# Patient Record
Sex: Female | Born: 1935 | Race: White | Hispanic: No | Smoking: Never smoker
Health system: Southern US, Community
[De-identification: ages and names within clinical notes are randomized; demographics above are authoritative.]

## PROBLEM LIST (undated history)

## (undated) DIAGNOSIS — E119 Type 2 diabetes mellitus without complications: Secondary | ICD-10-CM

## (undated) DIAGNOSIS — J189 Pneumonia, unspecified organism: Secondary | ICD-10-CM

## (undated) DIAGNOSIS — I2510 Atherosclerotic heart disease of native coronary artery without angina pectoris: Secondary | ICD-10-CM

## (undated) DIAGNOSIS — F419 Anxiety disorder, unspecified: Secondary | ICD-10-CM

## (undated) DIAGNOSIS — I10 Essential (primary) hypertension: Secondary | ICD-10-CM

## (undated) HISTORY — PX: CATARACT EXTRACTION: SUR2

---

## 1999-07-27 ENCOUNTER — Encounter: Payer: Self-pay | Admitting: Specialist

## 1999-07-27 ENCOUNTER — Ambulatory Visit (HOSPITAL_COMMUNITY): Admission: RE | Admit: 1999-07-27 | Discharge: 1999-07-28 | Payer: Self-pay | Admitting: Specialist

## 1999-10-13 ENCOUNTER — Encounter: Admission: RE | Admit: 1999-10-13 | Discharge: 1999-10-29 | Payer: Self-pay | Admitting: Specialist

## 2000-09-14 ENCOUNTER — Encounter: Payer: Self-pay | Admitting: Internal Medicine

## 2000-09-14 ENCOUNTER — Encounter: Admission: RE | Admit: 2000-09-14 | Discharge: 2000-09-14 | Payer: Self-pay | Admitting: Internal Medicine

## 2002-08-20 ENCOUNTER — Encounter: Payer: Self-pay | Admitting: Emergency Medicine

## 2002-08-20 ENCOUNTER — Inpatient Hospital Stay (HOSPITAL_COMMUNITY): Admission: AC | Admit: 2002-08-20 | Discharge: 2002-08-25 | Payer: Self-pay

## 2002-09-24 ENCOUNTER — Encounter (HOSPITAL_COMMUNITY): Admission: RE | Admit: 2002-09-24 | Discharge: 2002-12-23 | Payer: Self-pay | Admitting: Cardiology

## 2002-11-07 ENCOUNTER — Encounter: Payer: Self-pay | Admitting: Cardiology

## 2002-11-07 ENCOUNTER — Ambulatory Visit (HOSPITAL_COMMUNITY): Admission: RE | Admit: 2002-11-07 | Discharge: 2002-11-07 | Payer: Self-pay | Admitting: Cardiology

## 2002-11-15 ENCOUNTER — Ambulatory Visit (HOSPITAL_COMMUNITY): Admission: RE | Admit: 2002-11-15 | Discharge: 2002-11-16 | Payer: Self-pay | Admitting: Cardiology

## 2004-05-01 ENCOUNTER — Ambulatory Visit: Payer: Self-pay | Admitting: Hematology & Oncology

## 2004-07-10 ENCOUNTER — Ambulatory Visit: Payer: Self-pay | Admitting: Hematology & Oncology

## 2004-09-17 ENCOUNTER — Ambulatory Visit: Payer: Self-pay | Admitting: Hematology & Oncology

## 2004-12-17 ENCOUNTER — Ambulatory Visit: Payer: Self-pay | Admitting: Hematology & Oncology

## 2005-03-18 ENCOUNTER — Ambulatory Visit: Payer: Self-pay | Admitting: Hematology & Oncology

## 2005-06-17 ENCOUNTER — Ambulatory Visit: Payer: Self-pay | Admitting: Hematology & Oncology

## 2005-07-23 ENCOUNTER — Encounter: Admission: RE | Admit: 2005-07-23 | Discharge: 2005-07-23 | Payer: Self-pay | Admitting: Internal Medicine

## 2005-09-17 ENCOUNTER — Ambulatory Visit: Payer: Self-pay | Admitting: Hematology & Oncology

## 2005-10-15 LAB — CBC WITH DIFFERENTIAL/PLATELET
Basophils Absolute: 0 10*3/uL (ref 0.0–0.1)
EOS%: 3.8 % (ref 0.0–7.0)
HGB: 14.5 g/dL (ref 11.6–15.9)
LYMPH%: 37.6 % (ref 14.0–48.0)
MCH: 29.2 pg (ref 26.0–34.0)
MCV: 88.3 fL (ref 81.0–101.0)
MONO%: 6.2 % (ref 0.0–13.0)
Platelets: 198 10*3/uL (ref 145–400)
RBC: 4.97 10*6/uL (ref 3.70–5.32)
RDW: 15.7 % — ABNORMAL HIGH (ref 11.3–14.5)

## 2005-11-12 ENCOUNTER — Ambulatory Visit: Payer: Self-pay | Admitting: Hematology & Oncology

## 2005-11-12 LAB — CBC WITH DIFFERENTIAL/PLATELET
Basophils Absolute: 0 10*3/uL (ref 0.0–0.1)
EOS%: 4.6 % (ref 0.0–7.0)
Eosinophils Absolute: 0.3 10*3/uL (ref 0.0–0.5)
HCT: 43.4 % (ref 34.8–46.6)
HGB: 14.4 g/dL (ref 11.6–15.9)
MCH: 29.4 pg (ref 26.0–34.0)
MCV: 88.4 fL (ref 81.0–101.0)
MONO%: 6.5 % (ref 0.0–13.0)
NEUT%: 51.4 % (ref 39.6–76.8)

## 2005-12-10 LAB — CBC WITH DIFFERENTIAL/PLATELET
Basophils Absolute: 0 10*3/uL (ref 0.0–0.1)
Eosinophils Absolute: 0.3 10*3/uL (ref 0.0–0.5)
HCT: 45.3 % (ref 34.8–46.6)
HGB: 15.3 g/dL (ref 11.6–15.9)
LYMPH%: 23.5 % (ref 14.0–48.0)
MONO#: 0.5 10*3/uL (ref 0.1–0.9)
NEUT#: 5.8 10*3/uL (ref 1.5–6.5)
NEUT%: 67.3 % (ref 39.6–76.8)
Platelets: 192 10*3/uL (ref 145–400)
WBC: 8.7 10*3/uL (ref 3.9–10.0)

## 2006-01-04 ENCOUNTER — Ambulatory Visit: Payer: Self-pay | Admitting: Hematology & Oncology

## 2006-01-07 LAB — CBC WITH DIFFERENTIAL/PLATELET
Basophils Absolute: 0 10*3/uL (ref 0.0–0.1)
Eosinophils Absolute: 0.2 10*3/uL (ref 0.0–0.5)
HGB: 14.7 g/dL (ref 11.6–15.9)
MCV: 88.7 fL (ref 81.0–101.0)
MONO%: 5.8 % (ref 0.0–13.0)
NEUT#: 2.9 10*3/uL (ref 1.5–6.5)
RDW: 15.9 % — ABNORMAL HIGH (ref 11.3–14.5)

## 2006-02-04 LAB — CBC WITH DIFFERENTIAL/PLATELET
BASO%: 0.3 % (ref 0.0–2.0)
MCHC: 33.4 g/dL (ref 32.0–36.0)
MONO#: 0.4 10*3/uL (ref 0.1–0.9)
RBC: 5.07 10*6/uL (ref 3.70–5.32)
RDW: 15.4 % — ABNORMAL HIGH (ref 11.3–14.5)
WBC: 8 10*3/uL (ref 3.9–10.0)
lymph#: 2.3 10*3/uL (ref 0.9–3.3)

## 2006-03-09 ENCOUNTER — Ambulatory Visit: Payer: Self-pay | Admitting: Hematology & Oncology

## 2006-03-11 LAB — CBC WITH DIFFERENTIAL/PLATELET
BASO%: 0.3 % (ref 0.0–2.0)
Basophils Absolute: 0 10*3/uL (ref 0.0–0.1)
HCT: 43.5 % (ref 34.8–46.6)
HGB: 14.8 g/dL (ref 11.6–15.9)
MCHC: 34.1 g/dL (ref 32.0–36.0)
MONO#: 0.6 10*3/uL (ref 0.1–0.9)
NEUT#: 2.8 10*3/uL (ref 1.5–6.5)
NEUT%: 47.4 % (ref 39.6–76.8)
WBC: 6 10*3/uL (ref 3.9–10.0)
lymph#: 2.4 10*3/uL (ref 0.9–3.3)

## 2006-05-04 ENCOUNTER — Ambulatory Visit: Payer: Self-pay | Admitting: Hematology & Oncology

## 2006-05-06 LAB — CBC WITH DIFFERENTIAL/PLATELET
BASO%: 0.3 % (ref 0.0–2.0)
LYMPH%: 32.3 % (ref 14.0–48.0)
MCHC: 33.5 g/dL (ref 32.0–36.0)
MCV: 90.8 fL (ref 81.0–101.0)
MONO%: 9.2 % (ref 0.0–13.0)
Platelets: 193 10*3/uL (ref 145–400)
RBC: 4.97 10*6/uL (ref 3.70–5.32)
RDW: 14.9 % — ABNORMAL HIGH (ref 11.3–14.5)
WBC: 5.6 10*3/uL (ref 3.9–10.0)

## 2006-06-03 LAB — CBC WITH DIFFERENTIAL/PLATELET
BASO%: 0.3 % (ref 0.0–2.0)
Basophils Absolute: 0 10*3/uL (ref 0.0–0.1)
EOS%: 3.1 % (ref 0.0–7.0)
HGB: 14.9 g/dL (ref 11.6–15.9)
MCH: 30.9 pg (ref 26.0–34.0)
MCHC: 34.1 g/dL (ref 32.0–36.0)
MCV: 90.5 fL (ref 81.0–101.0)
MONO%: 9.9 % (ref 0.0–13.0)
NEUT%: 55.7 % (ref 39.6–76.8)
RDW: 14.6 % — ABNORMAL HIGH (ref 11.3–14.5)

## 2006-06-28 ENCOUNTER — Ambulatory Visit: Payer: Self-pay | Admitting: Hematology & Oncology

## 2006-07-01 LAB — CBC WITH DIFFERENTIAL/PLATELET
BASO%: 0.6 % (ref 0.0–2.0)
EOS%: 4.1 % (ref 0.0–7.0)
Eosinophils Absolute: 0.2 10*3/uL (ref 0.0–0.5)
LYMPH%: 32.3 % (ref 14.0–48.0)
MCHC: 33.2 g/dL (ref 32.0–36.0)
MCV: 91.2 fL (ref 81.0–101.0)
MONO%: 9.9 % (ref 0.0–13.0)
NEUT#: 3 10*3/uL (ref 1.5–6.5)
RBC: 5.04 10*6/uL (ref 3.70–5.32)
RDW: 14.5 % (ref 11.3–14.5)

## 2006-07-29 LAB — CBC WITH DIFFERENTIAL/PLATELET
BASO%: 0.5 % (ref 0.0–2.0)
Eosinophils Absolute: 0.2 10*3/uL (ref 0.0–0.5)
MCHC: 34.6 g/dL (ref 32.0–36.0)
MONO#: 0.5 10*3/uL (ref 0.1–0.9)
NEUT#: 2.8 10*3/uL (ref 1.5–6.5)
Platelets: 187 10*3/uL (ref 145–400)
RBC: 5.06 10*6/uL (ref 3.70–5.32)
RDW: 14.6 % — ABNORMAL HIGH (ref 11.3–14.5)
WBC: 5.7 10*3/uL (ref 3.9–10.0)
lymph#: 2.1 10*3/uL (ref 0.9–3.3)

## 2006-08-05 ENCOUNTER — Encounter: Admission: RE | Admit: 2006-08-05 | Discharge: 2006-08-05 | Payer: Self-pay | Admitting: Internal Medicine

## 2006-08-23 ENCOUNTER — Ambulatory Visit: Payer: Self-pay | Admitting: Hematology & Oncology

## 2006-08-26 LAB — CBC WITH DIFFERENTIAL/PLATELET
BASO%: 0.2 % (ref 0.0–2.0)
Eosinophils Absolute: 0.2 10*3/uL (ref 0.0–0.5)
HCT: 44.9 % (ref 34.8–46.6)
LYMPH%: 34.8 % (ref 14.0–48.0)
MCHC: 34.2 g/dL (ref 32.0–36.0)
MONO#: 0.5 10*3/uL (ref 0.1–0.9)
NEUT#: 3.2 10*3/uL (ref 1.5–6.5)
NEUT%: 53.7 % (ref 39.6–76.8)
Platelets: 172 10*3/uL (ref 145–400)
WBC: 6 10*3/uL (ref 3.9–10.0)
lymph#: 2.1 10*3/uL (ref 0.9–3.3)

## 2006-08-26 LAB — FERRITIN: Ferritin: 10 ng/mL (ref 10–291)

## 2006-09-23 LAB — CBC WITH DIFFERENTIAL/PLATELET
Basophils Absolute: 0 10*3/uL (ref 0.0–0.1)
Eosinophils Absolute: 0.2 10*3/uL (ref 0.0–0.5)
HCT: 44.7 % (ref 34.8–46.6)
HGB: 15.4 g/dL (ref 11.6–15.9)
MCH: 30.8 pg (ref 26.0–34.0)
MCV: 89.7 fL (ref 81.0–101.0)
NEUT#: 2.9 10*3/uL (ref 1.5–6.5)
NEUT%: 53.5 % (ref 39.6–76.8)
RDW: 12.7 % (ref 11.3–14.5)
lymph#: 1.9 10*3/uL (ref 0.9–3.3)

## 2006-10-18 ENCOUNTER — Ambulatory Visit: Payer: Self-pay | Admitting: Hematology & Oncology

## 2006-10-19 ENCOUNTER — Emergency Department (HOSPITAL_COMMUNITY): Admission: EM | Admit: 2006-10-19 | Discharge: 2006-10-19 | Payer: Self-pay | Admitting: Emergency Medicine

## 2006-11-18 LAB — CBC WITH DIFFERENTIAL/PLATELET
Basophils Absolute: 0 10*3/uL (ref 0.0–0.1)
EOS%: 3.8 % (ref 0.0–7.0)
Eosinophils Absolute: 0.3 10*3/uL (ref 0.0–0.5)
HGB: 16.2 g/dL — ABNORMAL HIGH (ref 11.6–15.9)
LYMPH%: 31.8 % (ref 14.0–48.0)
MCH: 32.4 pg (ref 26.0–34.0)
MCV: 92.2 fL (ref 81.0–101.0)
MONO%: 7.1 % (ref 0.0–13.0)
NEUT#: 4 10*3/uL (ref 1.5–6.5)
NEUT%: 57 % (ref 39.6–76.8)
Platelets: 176 10*3/uL (ref 145–400)

## 2006-12-06 ENCOUNTER — Ambulatory Visit: Payer: Self-pay | Admitting: Hematology & Oncology

## 2006-12-09 LAB — CBC WITH DIFFERENTIAL/PLATELET
EOS%: 3.2 % (ref 0.0–7.0)
Eosinophils Absolute: 0.2 10*3/uL (ref 0.0–0.5)
LYMPH%: 29 % (ref 14.0–48.0)
MCH: 32.9 pg (ref 26.0–34.0)
MCHC: 35.1 g/dL (ref 32.0–36.0)
MCV: 93.7 fL (ref 81.0–101.0)
MONO%: 7.8 % (ref 0.0–13.0)
Platelets: 179 10*3/uL (ref 145–400)
RBC: 4.88 10*6/uL (ref 3.70–5.32)

## 2007-03-08 ENCOUNTER — Ambulatory Visit: Payer: Self-pay | Admitting: Hematology & Oncology

## 2007-03-10 LAB — CBC WITH DIFFERENTIAL/PLATELET
BASO%: 0.2 % (ref 0.0–2.0)
LYMPH%: 33.3 % (ref 14.0–48.0)
MCHC: 35.2 g/dL (ref 32.0–36.0)
MONO#: 0.5 10*3/uL (ref 0.1–0.9)
MONO%: 8.5 % (ref 0.0–13.0)
Platelets: 167 10*3/uL (ref 145–400)
RBC: 4.88 10*6/uL (ref 3.70–5.32)
RDW: 13.3 % (ref 11.3–14.5)
WBC: 6.2 10*3/uL (ref 3.9–10.0)

## 2007-09-05 ENCOUNTER — Ambulatory Visit: Payer: Self-pay | Admitting: Hematology & Oncology

## 2007-09-07 LAB — CBC & DIFF AND RETIC
BASO%: 0.3 % (ref 0.0–2.0)
EOS%: 2.4 % (ref 0.0–7.0)
IRF: 0.35 — ABNORMAL HIGH (ref 0.130–0.330)
MCH: 33.3 pg (ref 26.0–34.0)
MCHC: 34.9 g/dL (ref 32.0–36.0)
MONO#: 0.4 10*3/uL (ref 0.1–0.9)
NEUT%: 56.9 % (ref 39.6–76.8)
RBC: 4.73 10*6/uL (ref 3.70–5.32)
RDW: 13 % (ref 11.3–14.5)
RETIC #: 99.8 10*3/uL (ref 19.7–115.1)
Retic %: 2.1 % (ref 0.4–2.3)
WBC: 6.1 10*3/uL (ref 3.9–10.0)
lymph#: 2.1 10*3/uL (ref 0.9–3.3)

## 2007-09-07 LAB — FERRITIN: Ferritin: 37 ng/mL (ref 10–291)
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
## 2007-10-20 ENCOUNTER — Encounter: Admission: RE | Admit: 2007-10-20 | Discharge: 2007-10-20 | Payer: Self-pay | Admitting: Internal Medicine

## 2008-11-29 ENCOUNTER — Encounter: Admission: RE | Admit: 2008-11-29 | Discharge: 2008-11-29 | Payer: Self-pay | Admitting: Internal Medicine

## 2010-03-25 ENCOUNTER — Encounter: Admission: RE | Admit: 2010-03-25 | Discharge: 2010-03-25 | Payer: Self-pay | Admitting: Internal Medicine

## 2010-07-12 ENCOUNTER — Encounter: Payer: Self-pay | Admitting: Internal Medicine

## 2010-11-06 NOTE — Cardiovascular Report (Signed)
NAME:  Diane Fry, Diane Fry                           ACCOUNT NO.:  [id number]   MEDICAL RECORD NO.:  [id number]                   PATIENT TYPE:  OIB   LOCATION:  6532                                 FACILITY:  [facility]   PHYSICIAN:  Mohan N. Sharyn Lull, M.D.              DATE OF BIRTH:  [birth year]   DATE OF PROCEDURE:  11/15/2002  DATE OF DISCHARGE:  11/16/2002                              CARDIAC CATHETERIZATION   PROCEDURE:  1. Left cardiac catheterization with selective left and right coronary     angiogram and left ventriculography via the right groin using Judkins     technique.  2. Successful percutaneous transluminal coronary angioplasty to the proximal     left anterior descending using 3.25 mm x 15 mm long Quantum Maverick     balloon.   INDICATIONS:  Diane Fry is a 77 year old white female with past medical  history significant for coronary artery disease, status post non Q wave MI  recently, status post PTCA and stenting to the LAD, hypertension,  hypercholesterolemia and depression. She complains of retrosternal chest  tightness radiating to the neck associated with diaphoresis lasting three  minutes when under stress.  The patient denies any nausea or vomiting or  shortness of breath.  Denies palpation, lightheadedness or syncope.  The  patient underwent Persantine Cardiolite on Nov 07, 2002 which showed  partially reversible anterolateral wall ischemia with EF of 85%.  Due to  typical angina, chest pain and partial Persantine and Cardiolite, the  patient is advised for left cath, possible PTCA stenting.   DESCRIPTION OF PROCEDURE:  After obtaining the informed consent, the patient  was brought to the cath lab and was placed on the fluoroscopy table.  Right  groin was prepped and draped in the usual fashion.  Two percent Xylocaine  was used for local anesthesia in the right groin.  With the help of thin-  wall needle, a 6-French arterial sheath was placed.  The sheath was  aspirated and flushed.  Next, a 6-French left Judkins catheter was advanced  over the wire under fluoroscopic guidance up to the ascending aorta. Wire  was pulled out and the catheter was aspirated and connected to the manifold.  Catheter was further advanced and engaged into the left coronary ostium.  Multiple views of the left system were taken.  Next, the catheter was  disengaged and was pulled out over the wire and we placed a 6-French right  Judkins catheter which was advanced over the wire under fluoroscopic  guidance up to the ascending aorta.  Wire was pulled out, catheter was  aspirated and connected to the manifold.  Catheter was further advanced and  engaged into the right coronary ostium.  Multiple views of the right system  were taken.  Next, the catheter was disengaged and was pulled out towards  the wire and we placed a 6-French pigtail catheter which was advanced  over  the wire under fluoroscopic guidance up to the ascending aorta.  Wire was  pulled out, the catheter was aspirated and connected to the manifold.  The  catheter was further advanced across the aortic valve into the left  ventricle.  The left ventricular pressures were recorded.  Next, left  ventriculography was done in 30-degree RAO position.  Post angiographic  pressures were recorded from the LV and then pull-back pressures were  recorded from the aorta.  There was no gradient across the aortic valve.  Next, the pigtail catheter was pulled out over the wire, sheaths aspirated  and flushed.   FINDINGS:  The LV showed good LV systolic function.  EF of 60-65%.  Left  main was patent.  LAD had 80-85% focal instant restenosis in proximal stent.  Diagonal #1 to diagonal #3 were very, very small.  Left circumflex was very  small which was patent.  OM-1 has 50-60% proximal stenosis, vessel size less  than 1.5 mm.  RCA was patent.   INTERVENTIONAL PROCEDURE:  Successful PTCA to proximal LAD was done using  3.25 mg  x 15 mm long Quantum Maverick balloon.  Two inflations were done in  proximal LAD inside the stent going up to 15 atmospheres of pressure.  Lesion was dilated from 80-85% to 0% residual with excellent TIMI-3 distal  flow without evidence of  dissection or destabilization.  The patient received aqueous heparin,  Integrilin and 300 mg of Plavix during the procedure.  The patient tolerated  the procedure well.  There were no complications.  The patient was  transferred to the recovery room in stable condition.                                               Diane Fry. Sharyn Lull, M.D.    MNH/MEDQ  D:  11/15/2002  T:  11/17/2002  Job:  [id number]

## 2010-11-06 NOTE — Discharge Summary (Signed)
NAME:  Diane Fry, Diane Fry                           ACCOUNT NO.:  [id number]   MEDICAL RECORD NO.:  [id number]                   PATIENT TYPE:  OIB   LOCATION:  6532                                 FACILITY:  [facility]   PHYSICIAN:  Mohan N. Sharyn Lull, M.D.              DATE OF BIRTH:  10-Oct-1928   DATE OF ADMISSION:  11/15/2002  DATE OF DISCHARGE:  11/16/2002                                 DISCHARGE SUMMARY   ADMISSION DIAGNOSES:  1. New onset angina, for Persantine/Cardiolite; rule out restenosis.  2. Status post recent non-Q wave myocardial infarction.  3. Status post percutaneous transluminal coronary artery in-stent to left     anterior descending in March 2004.  4. Hypertension.  5. Hypercholesterolemia.  6. Depression.  7. Mild renal insufficiency.   DISCHARGE DIAGNOSES:  1. New onset angina, post Persantine/Cardiolite.  2. Status post percutaneous transluminal coronary artery to in-stent     restenosis in proximal left anterior descending.  3. Status post recent non-Q wave myocardial infarction requiring     percutaneous transluminal coronary artery and stenting to left anterior     descending in March 2004.  4. Hypertension.  5. Hypercholesterolemia.  6. Depression.  7. Status post mild renal insufficiency.  8. Anemia secondary to blood loss during the procedure and hydration.   DISCHARGE MEDICATIONS:  1. Toprol XL 25 mg one tablet daily.  2. Altace 5 mg one capsule daily.  3. Lipitor 80 mg 1/2 tablet daily.  4. Baby aspirin 81 mg two tablets daily.  5. Plavix 75 mg one tablet daily.  6. Nitrostat 0.4 mg sublingual use as directed.   ACTIVITY:  Avoid heavy lifting, pushing or pulling x48 hours.   DIET:  Low salt, low cholesterol.   DISCHARGE INSTRUCTIONS:  Post angioplasty instructions have been given.  The  patient has been advised to get a basic metabolic panel in one week.  Follow  up with me in one week.   CONDITION ON DISCHARGE:  Stable.   BRIEF HISTORY:  Ms.  Diane Fry is a 77 year old white female with past  medical history significant for coronary artery disease, status post recent  non-Q wave myocardial infarction, status post PTCA and stenting to LAD,  hypertension, hypercholesterolemia and depression, complaints of  retrosternal chest tightness radiating to the neck with associated  diaphoresis lasting a few minutes when under stress.  Denies any nausea,  vomiting, diaphoresis, denies shortness of breath, palpation, light  headedness or syncope.   PROCEDURE:  The patient underwent Persantine/Cardiolite on Nov 07, 2002,  which showed partially reversible anterolateral wall reversible ischemia  with an EF of  85%, typical anginal pain and post Persantine/Cardiolite.  The patient is advised of left heart possible PTCA and stenting.   PAST MEDICAL HISTORY:  As above.   PAST SURGICAL HISTORY:  None.   ALLERGIES:  None.   SOCIAL HISTORY:  She is divorced, lives  with a brother.  No history of  alcohol abuse or smoking.  She works at [employer] as a Holiday representative.   FAMILY HISTORY:  Mother died of a MI at age 82.  Father died of MI at age  7.  One daughter has coronary artery disease.   MEDICATIONS AT HOME:  1. Baby aspirin 81 mg p.o. daily.  2. Plavix 75 mg p.o. daily with food.  3. Toprol 25 mg p.o. daily.  4. Altace 5 mg p.o. daily.  5. Pravachol 20 mg p.o. daily.  6. HCTZ 75/50 daily which was refilled recently.   PHYSICAL EXAMINATION:  GENERAL:  She was alert, oriented and in no distress.  VITAL SIGNS:  Blood pressure 104/60, pulse 72.  NECK:  Supple, no JVD, no bruit.  LUNGS:  Clear to auscultation without rhonchi or rales.  CARDIOVASCULAR:  S1, S2 normal, S3 gallop.  ABDOMEN:  Soft, bowel sounds are present. Nontender.  EXTREMITIES:  No clubbing, cyanosis or edema.   HOSPITAL COURSE:  The patient was admitted and underwent left cardiac  catheterization with selective left and right coronary angiography and PTCA  to the proximal  LAD as per procedure report.  The patient tolerated the  procedure well, there were no complications.   Postprocedure the patient did not have any episodes of chest pain during the  hospital stay.  Her EKG did not show any acute ischemic changes.  Postprocedure her CPK is normal.  Phase one cardiac rehabilitation was  called.  The patient has been ambulating in the hallway without any chest  pain or discomfort; her groin is stable with no evidence of hematoma or  bruit.  The patient will be discharged home on the above medications and  will be followed up in my office in one week.                                               Eduardo Osier. Sharyn Lull, M.D.   MNH/MEDQ  D:  11/16/2002  T:  11/16/2002  Job:  [id number]

## 2010-11-06 NOTE — Op Note (Signed)
[facility]  Patient:    Diane Fry                         MRN: [id number] Proc. Date: 07/27/99 Adm. Date:  [id number] Attending:  Erasmo Leventhal                           Operative Report  PREOPERATIVE DIAGNOSIS: Right wrist, comminuted, displaced, and stable interarticular distal radius fracture with associated ulnar styloid fracture.  POSTOPERATIVE DIAGNOSIS: Right wrist, comminuted, displaced, and stable interarticular distal radius fracture with associated ulnar styloid fracture.  PROCEDURE: Closed reduction and application of AGEE wrist jack external fixator. Dorsal approach with allograft to fracture site.  SURGEON: R. Valma Cava, M.D.  ASSISTANT: French Ana Shuford, P.A.-C.  ANESTHESIA: General.  ESTIMATED BLOOD LOSS: Less than 10 cc.  DRAINS: None.  COMPLICATIONS: None  TOURNIQUET TIME: 1 hour 18 minutes, 250 mmHg.  DISPOSITION: PACU in stable condition.  DESCRIPTION OF PROCEDURE: The patient was counseled in the holding area, correct side was identified, papers were signed appropriately.  IV was started and antibiotics were given.  She was taken to the operating room and placed in supine position under general  endotracheal anesthesia.  The splint was removed from the right upper extremity. The fracture was palpated and somewhat loosened.  At this point in time she was  prepped with DuraPrep and draped in sterile fashion.  She was exsanguinated with Esmarch.  The tourniquet was inflated to 250 mmHg.  A standard radial-based incision to the second metacarpal was done for the ______ and external fixator placement.  This was placed utilizing standard surgical techniques.  A longitudinal skin incision was made through the skin and subcutaneous tissues.  The small veins were electrocoagulated.  Extensor carpi radialis was identified.  A 3.0 mm pin as placed from radius to ulna at the appropriate level. Then a  second metacarpal pin was placed distally.  C-arm confirmed excellent placement of the pins. Proximally the incision was made on the radial aspect of the radius to the appropriate level through the skin and subcutaneous tissue.  The fascia was opened.  The brachioradialis was retracted to the volar side.  I visualized the superficial retroradial nerve and retracted it to the volar side with the brachioradialis.  Dorsally, the extensor tendon was retracted to the dorsal side.  Two pins were placed in the radius at the appropriate level and confirmed with C-arm.  The wounds were irrigated.  Small veins were electrocoagulated.  The subcutaneous tissue was closed with Vicryl.  The skin was closed with nylon.  An AGEE external fixator as applied to the wrist.  Pins were cut distally.  Utilizing the C-arm and selective fracture reduction initially restoring length  followed by palmar translation followed by ulnar translation and then finally putting the wrist into a slightly extended position we made sure we had the fracture out to length and was well reduced.  We also made sure we did not overdistract her.  The scaphoid lunate was identified.  I felt it was satisfactory. It was a bit widened when she was ulnar flexed.  However, when the wrist went into a little bit of extension it reduced to what I felt was normal.  Additional preoperative x-rays were reviewed.  The ulnar styloid fracture was also reduced  anatomically.  The fracture was put through a range of motion and appeared to  be relatively stable.  Attention was directed to the dorsal aspect of the wrist where a longitudinal incision was made through the skin and subcutaneous tissues.  Small veins were electrocoagulated.  A small opening in the extensor retinaculum was performed. I went through the fourth dorsal extensor compartment, separated the tendons, went down to the fracture site.  Fracture hematoma was encountered.   It was irrigated. There was comminution there.  It was opened.  I palpated and confirmed the area of the bony defect or void. At this time, utilizing cancellous allograft mixed with Grafton it was mixed into a paste and then placed into the fracture site under direct visualization until the fracture void was filled.  The wounds were copiously irrigated at this time.  The extensor tendon had a full range of motion passively.  EPO remained reduced.  The extensor retinaculum was closed with Vicryl as was the subcutaneous tissue.  The skin was closed with nylon.  Final C-arm spots were taken which revealed excellent placement of the pins, external fixator, satisfactory reduction and placement of the graft.  Then 10 cc of 1/2% Marcaine was placed on the wound edges.  A sterile dressing was applied. he tourniquet was deflated.  She had normal pulses in the hand at the end of the case. She was placed in a plaster splint with slight supination, flexed to 90%.  She was given 1 gm of Ancef intravenously.  She was awakened and extubated. She was taken to the operating room in stable condition.  There were no complications. Sponge and needle counts were correct. DD:  07/27/99 TD:  07/27/99 Job: [id number]

## 2011-04-07 ENCOUNTER — Other Ambulatory Visit: Payer: Self-pay | Admitting: Internal Medicine

## 2011-04-07 DIAGNOSIS — Z1231 Encounter for screening mammogram for malignant neoplasm of breast: Secondary | ICD-10-CM

## 2011-04-26 ENCOUNTER — Ambulatory Visit: Payer: Self-pay

## 2011-05-26 ENCOUNTER — Ambulatory Visit
Admission: RE | Admit: 2011-05-26 | Discharge: 2011-05-26 | Disposition: A | Discharge status: Home / Self Care | Payer: Medicare Other | Source: Ambulatory Visit | Attending: Internal Medicine | Admitting: Internal Medicine

## 2011-05-26 DIAGNOSIS — Z1231 Encounter for screening mammogram for malignant neoplasm of breast: Secondary | ICD-10-CM

## 2011-07-30 ENCOUNTER — Other Ambulatory Visit: Payer: Self-pay | Admitting: Cardiology

## 2011-11-15 ENCOUNTER — Other Ambulatory Visit: Payer: Self-pay | Admitting: Cardiology

## 2011-11-26 ENCOUNTER — Other Ambulatory Visit: Payer: Self-pay | Admitting: Cardiology

## 2012-05-17 ENCOUNTER — Other Ambulatory Visit: Payer: Self-pay | Admitting: Internal Medicine

## 2012-05-17 DIAGNOSIS — Z1231 Encounter for screening mammogram for malignant neoplasm of breast: Secondary | ICD-10-CM

## 2012-06-28 ENCOUNTER — Ambulatory Visit
Admission: RE | Admit: 2012-06-28 | Discharge: 2012-06-28 | Disposition: A | Discharge status: Home / Self Care | Payer: Medicare Other | Source: Ambulatory Visit | Attending: Internal Medicine | Admitting: Internal Medicine

## 2012-06-28 DIAGNOSIS — Z1231 Encounter for screening mammogram for malignant neoplasm of breast: Secondary | ICD-10-CM

## 2013-06-21 DIAGNOSIS — J189 Pneumonia, unspecified organism: Secondary | ICD-10-CM

## 2013-06-21 HISTORY — DX: Pneumonia, unspecified organism: J18.9

## 2013-09-06 ENCOUNTER — Encounter (HOSPITAL_COMMUNITY): Payer: Self-pay | Admitting: Emergency Medicine

## 2013-09-06 ENCOUNTER — Inpatient Hospital Stay (HOSPITAL_COMMUNITY)
Admission: EM | Admit: 2013-09-06 | Discharge: 2013-09-13 | DRG: 621 | Disposition: A | Discharge status: Home / Self Care | Payer: Medicare Other | Attending: Neurosurgery | Admitting: Neurosurgery

## 2013-09-06 ENCOUNTER — Emergency Department (HOSPITAL_COMMUNITY): Payer: Medicare Other

## 2013-09-06 DIAGNOSIS — I2510 Atherosclerotic heart disease of native coronary artery without angina pectoris: Secondary | ICD-10-CM | POA: Diagnosis present

## 2013-09-06 DIAGNOSIS — I10 Essential (primary) hypertension: Secondary | ICD-10-CM | POA: Diagnosis present

## 2013-09-06 DIAGNOSIS — D352 Benign neoplasm of pituitary gland: Principal | ICD-10-CM | POA: Diagnosis present

## 2013-09-06 DIAGNOSIS — H53469 Homonymous bilateral field defects, unspecified side: Secondary | ICD-10-CM | POA: Diagnosis present

## 2013-09-06 DIAGNOSIS — D497 Neoplasm of unspecified behavior of endocrine glands and other parts of nervous system: Secondary | ICD-10-CM

## 2013-09-06 DIAGNOSIS — H5347 Heteronymous bilateral field defects: Secondary | ICD-10-CM | POA: Diagnosis present

## 2013-09-06 DIAGNOSIS — E119 Type 2 diabetes mellitus without complications: Secondary | ICD-10-CM | POA: Diagnosis present

## 2013-09-06 DIAGNOSIS — D353 Benign neoplasm of craniopharyngeal duct: Principal | ICD-10-CM

## 2013-09-06 HISTORY — DX: Essential (primary) hypertension: I10

## 2013-09-06 HISTORY — DX: Type 2 diabetes mellitus without complications: E11.9

## 2013-09-06 HISTORY — DX: Atherosclerotic heart disease of native coronary artery without angina pectoris: I25.10

## 2013-09-06 LAB — I-STAT CHEM 8, ED
BUN: 35 mg/dL — ABNORMAL HIGH (ref 6–23)
Calcium, Ion: 1.21 mmol/L (ref 1.13–1.30)
Chloride: 93 meq/L — ABNORMAL LOW (ref 96–112)
Creatinine, Ser: 0.9 mg/dL (ref 0.50–1.10)
Glucose, Bld: 88 mg/dL (ref 70–99)
HCT: 45 % (ref 36.0–46.0)
Hemoglobin: 15.3 g/dL — ABNORMAL HIGH (ref 12.0–15.0)
Potassium: 4.3 mEq/L (ref 3.7–5.3)
Sodium: 134 mEq/L — ABNORMAL LOW (ref 137–147)
TCO2: 33 mmol/L (ref 0–100)

## 2013-09-06 LAB — CBC WITH DIFFERENTIAL/PLATELET
Basophils Absolute: 0 K/uL (ref 0.0–0.1)
Basophils Relative: 0 % (ref 0–1)
Eosinophils Absolute: 0.1 K/uL (ref 0.0–0.7)
Eosinophils Relative: 1 % (ref 0–5)
HCT: 43.6 % (ref 36.0–46.0)
Hemoglobin: 15.2 g/dL — ABNORMAL HIGH (ref 12.0–15.0)
Lymphocytes Relative: 23 % (ref 12–46)
Lymphs Abs: 3 10*3/uL (ref 0.7–4.0)
MCH: 32.9 pg (ref 26.0–34.0)
MCHC: 34.9 g/dL (ref 30.0–36.0)
MCV: 94.4 fL (ref 78.0–100.0)
Monocytes Absolute: 1.3 K/uL — ABNORMAL HIGH (ref 0.1–1.0)
Monocytes Relative: 10 % (ref 3–12)
Neutro Abs: 8.9 10*3/uL — ABNORMAL HIGH (ref 1.7–7.7)
Neutrophils Relative %: 67 % (ref 43–77)
Platelets: 216 10*3/uL (ref 150–400)
RBC: 4.62 MIL/uL (ref 3.87–5.11)
RDW: 13.6 % (ref 11.5–15.5)
WBC: 13.3 10*3/uL — ABNORMAL HIGH (ref 4.0–10.5)

## 2013-09-06 LAB — PROTIME-INR
INR: 1.04 (ref 0.00–1.49)
Prothrombin Time: 13.4 seconds (ref 11.6–15.2)

## 2013-09-06 LAB — BASIC METABOLIC PANEL
CO2: 28 mEq/L (ref 19–32)
Chloride: 90 mEq/L — ABNORMAL LOW (ref 96–112)
GFR calc non Af Amer: 78 mL/min — ABNORMAL LOW (ref >90)
Glucose, Bld: 87 mg/dL (ref 70–99)
Potassium: 4.3 mEq/L (ref 3.7–5.3)
Sodium: 129 mEq/L — ABNORMAL LOW (ref 137–147)

## 2013-09-06 LAB — APTT: aPTT: 24 s (ref 24–37)

## 2013-09-06 LAB — BASIC METABOLIC PANEL WITH GFR
BUN: 28 mg/dL — ABNORMAL HIGH (ref 6–23)
Calcium: 9.4 mg/dL (ref 8.4–10.5)
Creatinine, Ser: 0.7 mg/dL (ref 0.50–1.10)
GFR calc Af Amer: >90 mL/min (ref >90)

## 2013-09-06 MED ORDER — GADOBENATE DIMEGLUMINE 529 MG/ML IV SOLN
15.0000 mL | Freq: Once | INTRAVENOUS | Status: AC
  Administered 2013-09-06: 11.25 mL via INTRAVENOUS

## 2013-09-06 MED ORDER — SODIUM CHLORIDE 0.9 % IV SOLN
Freq: Once | INTRAVENOUS | Status: AC
  Administered 2013-09-06: 1000 mL via INTRAVENOUS

## 2013-09-06 NOTE — ED Notes (Signed)
Received call from MRI, need Creatinine level to run Contrast.

## 2013-09-06 NOTE — ED Notes (Signed)
Had cataract surgery on 3/11 and  Last Friday strarted to have h/a rt side went to med dr on Monday was told she had fluid/ infection in rt ear  And sinus issues ans she is not getting better w/ antiobiotic and went back to eye dr today and was told and she may have had a [place]

## 2013-09-06 NOTE — ED Notes (Signed)
Phlebotomy at bedside.

## 2013-09-06 NOTE — ED Provider Notes (Signed)
CSN: [id number]     Arrival date & time 09/06/13  1446 History   First MD Initiated Contact with Patient 09/06/13 1550     Chief Complaint  Patient presents with  . Visual Field Change     (Consider location/radiation/quality/duration/timing/severity/associated sxs/prior Treatment) HPI Comments: Patient is postop day 8 following cataract surgery on her left eye. About one week ago began having a headache persistently waxing and waning over the right posterior head area. She reports she does not typically get headaches. She has been taking BC powder that seemed to improve the pain. She saw her primary care physician this Monday and was told she had some fluid behind her ear and was put on antibiotics for a sinus infection which she has a strong prior history of. She saw her ophthalmologist today who dilated her left eye into reported that her vision was successfully repaired however discovered that she had a visual field deficits on the left side that seemingly involved both eyes. Patient denies slurred speech, focal numbness or weakness of her face arms or legs. She was sent here by her ophthalmologist due to the suspicion of a possible stroke. The patient does have a history of hypertension and diabetes. Family is present as well who reports  The history is provided by the patient and the spouse.    Past Medical History  Diagnosis Date  . Hypertension   . Coronary artery disease   . Diabetes mellitus without complication    History reviewed. No pertinent past surgical history. History reviewed. No pertinent family history. History  Substance Use Topics  . Smoking status: Never Smoker   . Smokeless tobacco: Not on file  . Alcohol Use: No   OB History   Grav Para Term Preterm Abortions TAB SAB Ect Mult Living                 Review of Systems    Allergies  Review of patient's allergies indicates no known allergies.  Home Medications   Current Outpatient Rx  Name  Route  Sig   Dispense  Refill  . ALPRAZolam (XANAX) 0.25 MG tablet   Oral   Take 0.25 mg by mouth 2 (two) times daily.         Marland Kitchen aspirin EC 81 MG tablet   Oral   Take 81 mg by mouth daily.         Marland Kitchen atorvastatin (LIPITOR) 80 MG tablet   Oral   Take 40 mg by mouth daily.         . cephALEXin (KEFLEX) 250 MG capsule   Oral   Take 250 mg by mouth 4 (four) times daily.         Marland Kitchen HYDROcodone-acetaminophen (NORCO/VICODIN) 5-325 MG per tablet   Oral   Take 1 tablet by mouth 2 (two) times daily.         Marland Kitchen levothyroxine (SYNTHROID, LEVOTHROID) 50 MCG tablet   Oral   Take 50 mcg by mouth daily.         . metFORMIN (GLUCOPHAGE) 500 MG tablet   Oral   Take 500 mg by mouth daily.         . methylPREDNISolone (MEDROL) 4 MG tablet   Oral   Take 4 mg by mouth 2 (two) times daily.         . metoprolol (LOPRESSOR) 50 MG tablet   Oral   Take 50 mg by mouth 2 (two) times daily.         Marland Kitchen  PROLENSA 0.07 % SOLN   Both Eyes   Place 1 drop into both eyes daily.         . ramipril (ALTACE) 10 MG capsule   Oral   Take 10 mg by mouth 2 (two) times daily.          BP 147/64  Pulse 55  Temp(Src) 98.1 F (36.7 C)  Resp 18  Wt 171 lb (77.565 kg)  SpO2 98% Physical Exam  Nursing note and vitals reviewed. Constitutional: She is oriented to person, place, and time. She appears well-developed and well-nourished. No distress.  HENT:  Head: Normocephalic and atraumatic.  Eyes: Conjunctivae are normal. Right eye exhibits no chemosis. Left eye exhibits no chemosis. Right eye exhibits normal extraocular motion. Left pupil is not reactive. Pupils are unequal.  Neck: Normal range of motion. Neck supple.  Cardiovascular: Normal rate, regular rhythm and intact distal pulses.   Pulmonary/Chest: Effort normal. No respiratory distress. She has no wheezes.  Abdominal: Soft. She exhibits no distension. There is no tenderness.  Neurological: She is alert and oriented to person, place, and time.  She is not disoriented. She displays no tremor. She exhibits normal muscle tone. Coordination normal. GCS eye subscore is 4. GCS verbal subscore is 5. GCS motor subscore is 6.  Left side visual field defect, normal on right, no arm drift, 5/5 strength in extremities, normal finger to nose bilaterally  Skin: Skin is warm and dry. No rash noted. She is not diaphoretic. No pallor.  Psychiatric: She has a normal mood and affect.    ED Course  Procedures (including critical care time) Labs Review Labs Reviewed  I-STAT CHEM 8, ED - Abnormal; Notable for the following:    Sodium 134 (*)    Chloride 93 (*)    BUN 35 (*)    Hemoglobin 15.3 (*)    All other components within normal limits  CBC WITH DIFFERENTIAL  BASIC METABOLIC PANEL  APTT  PROTIME-INR   Imaging Review Mr Kizzie Fantasia Contrast  09/06/2013   CLINICAL DATA:  Recent cataract surgery 08/29/2013.  Headache.  EXAM: MRI HEAD WITHOUT AND WITH CONTRAST  TECHNIQUE: Multiplanar, multiecho pulse sequences of the brain and surrounding structures were obtained without and with intravenous contrast.  CONTRAST:  11.41mL MULTIHANCE GADOBENATE DIMEGLUMINE 529 MG/ML IV SOLN  COMPARISON:  None.  FINDINGS: Large mass arising from the sella extending into the suprasellar cistern. The sella is enlarged. The mass shows heterogeneous enhancement following contrast infusion. There is compression of the optic chiasm. There is invasion of the right cavernous sinus. The mass measures 1.9 x 1.5 x 3.1 cm. 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 There is a mild amount of hemorrhage in the tumor.  Negative for acute infarct.  There is generalized atrophy. Chronic microvascular ischemic change in the white matter. Ventricle size is appropriate for the level of atrophy. No shift of the midline structures. No other enhancing lesions are seen in the brain following contrast administration.  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 There is a mild amount of hemorrhage in the tumor.  Atrophy and chronic microvascular ischemia in the white matter. No acute infarct.  I discussed the findings with Dr. Dorna Mai   Electronically Signed   By: Franchot Gallo M.D.   On: 09/06/2013 20:42     EKG Interpretation None     RA sat is 95% and I interpret to be  adequate  9:20 PM Pt and family informed of diagnosis.  I contacted Dr. Vertell Limber through the [facility] and pt will need to be admitted to the NICU.  I offered to place temporary holding orders but Dr. Vertell Limber would like to see pt in the ED according to the RN.  Family informed.    MDM   Final diagnoses:  Pituitary tumor    Pt with unilateral field deficit to left.  Pt unsure when it may have occurred.  Pt had cataract surgery 8 days ago, HA for about 1 week as well.  No other focal deficits on exam.  Will get MRI to r/o CVA or tumor.      Saddie Benders. Dorna Mai, MD 09/06/13 2121

## 2013-09-06 NOTE — ED Notes (Signed)
Patient still at MRI, family member updated and given coffee.

## 2013-09-06 NOTE — ED Notes (Signed)
Dr. Ghim at bedside. 

## 2013-09-06 NOTE — ED Notes (Signed)
Pharmacy Tech is getting information from Pt at this time.

## 2013-09-07 ENCOUNTER — Encounter (HOSPITAL_COMMUNITY)
Admission: EM | Disposition: A | Discharge status: Home / Self Care | Payer: Self-pay | Source: Home / Self Care | Attending: Neurosurgery

## 2013-09-07 ENCOUNTER — Encounter (HOSPITAL_COMMUNITY): Payer: Self-pay | Admitting: Anesthesiology

## 2013-09-07 ENCOUNTER — Inpatient Hospital Stay (HOSPITAL_COMMUNITY): Payer: Medicare Other | Admitting: Anesthesiology

## 2013-09-07 ENCOUNTER — Encounter (HOSPITAL_COMMUNITY): Payer: Medicare Other | Admitting: Anesthesiology

## 2013-09-07 ENCOUNTER — Inpatient Hospital Stay (HOSPITAL_COMMUNITY): Payer: Medicare Other

## 2013-09-07 DIAGNOSIS — D352 Benign neoplasm of pituitary gland: Secondary | ICD-10-CM | POA: Diagnosis present

## 2013-09-07 HISTORY — PX: TRANSNASAL APPROACH: SHX6149

## 2013-09-07 HISTORY — PX: CRANIOTOMY: SHX93

## 2013-09-07 LAB — FOLLICLE STIMULATING HORMONE: FSH: 6.6 m[IU]/mL

## 2013-09-07 LAB — INSULIN-LIKE GROWTH FACTOR: Somatomedin C: 86 ng/mL (ref 31–179)

## 2013-09-07 LAB — GLUCOSE, CAPILLARY
Glucose-Capillary: 97 mg/dL (ref 70–99)
Glucose-Capillary: 103 mg/dL — ABNORMAL HIGH (ref 70–99)
Glucose-Capillary: 92 mg/dL (ref 70–99)

## 2013-09-07 LAB — CORTISOL-AM, BLOOD: Cortisol - AM: 1.6 ug/dL — ABNORMAL LOW (ref 4.3–22.4)

## 2013-09-07 LAB — LUTEINIZING HORMONE: LH: 0.3 m[IU]/mL

## 2013-09-07 LAB — TSH: TSH: 0.129 u[IU]/mL — ABNORMAL LOW (ref 0.350–4.500)

## 2013-09-07 LAB — HEMOGLOBIN A1C
Hgb A1c MFr Bld: 6.5 % — ABNORMAL HIGH (ref <5.7)
Mean Plasma Glucose: 140 mg/dL — ABNORMAL HIGH (ref <117)

## 2013-09-07 LAB — MRSA PCR SCREENING: MRSA by PCR: NEGATIVE

## 2013-09-07 LAB — T4, FREE: FREE T4: 0.96 ng/dL (ref 0.80–1.80)

## 2013-09-07 LAB — PROLACTIN: PROLACTIN: 14.9 ng/mL

## 2013-09-07 SURGERY — CRANIOTOMY HYPOPHYSECTOMY TRANSNASAL APPROACH
Anesthesia: General

## 2013-09-07 MED ORDER — SENNA 8.6 MG PO TABS
1.0000 | ORAL_TABLET | Freq: Two times a day (BID) | ORAL | Status: DC
  Administered 2013-09-08 – 2013-09-13 (×9): 8.6 mg via ORAL
  Filled 2013-09-07 (×14): qty 1

## 2013-09-07 MED ORDER — HYDROCORTISONE NA SUCCINATE PF 100 MG IJ SOLR
INTRAMUSCULAR | Status: DC | PRN
  Administered 2013-09-07: 100 mg via INTRAVENOUS

## 2013-09-07 MED ORDER — FLEET ENEMA 7-19 GM/118ML RE ENEM
1.0000 | ENEMA | Freq: Once | RECTAL | Status: AC | PRN
  Filled 2013-09-07: qty 1

## 2013-09-07 MED ORDER — HYDROMORPHONE HCL PF 1 MG/ML IJ SOLN
INTRAMUSCULAR | Status: AC
  Administered 2013-09-07: 0.5 mg via INTRAVENOUS
  Filled 2013-09-07: qty 1

## 2013-09-07 MED ORDER — SODIUM CHLORIDE 0.9 % IV SOLN
INTRAVENOUS | Status: DC | PRN
  Administered 2013-09-07 (×2): via INTRAVENOUS

## 2013-09-07 MED ORDER — LIDOCAINE HCL (CARDIAC) 20 MG/ML IV SOLN
INTRAVENOUS | Status: AC
  Filled 2013-09-07: qty 5

## 2013-09-07 MED ORDER — PHENYLEPHRINE HCL 10 MG/ML IJ SOLN
10.0000 mg | INTRAVENOUS | Status: DC | PRN
  Administered 2013-09-07: 20 ug/min via INTRAVENOUS

## 2013-09-07 MED ORDER — CEFAZOLIN SODIUM 1-5 GM-% IV SOLN
1.0000 g | Freq: Three times a day (TID) | INTRAVENOUS | Status: AC
  Administered 2013-09-08 (×2): 1 g via INTRAVENOUS
  Filled 2013-09-07 (×2): qty 50

## 2013-09-07 MED ORDER — ALUM & MAG HYDROXIDE-SIMETH 200-200-20 MG/5ML PO SUSP: 30.0000 mL | Freq: Four times a day (QID) | ORAL | Status: DC | PRN

## 2013-09-07 MED ORDER — OXYMETAZOLINE HCL 0.05 % NA SOLN
NASAL | Status: DC | PRN
  Administered 2013-09-07: 1 via NASAL

## 2013-09-07 MED ORDER — KETOROLAC TROMETHAMINE 0.5 % OP SOLN
1.0000 [drp] | Freq: Three times a day (TID) | OPHTHALMIC | Status: DC
  Administered 2013-09-07 – 2013-09-13 (×25): 1 [drp] via OPHTHALMIC
  Filled 2013-09-07: qty 3

## 2013-09-07 MED ORDER — ESMOLOL HCL 10 MG/ML IV SOLN
INTRAVENOUS | Status: DC | PRN
  Administered 2013-09-07: 20 mg via INTRAVENOUS

## 2013-09-07 MED ORDER — MEPERIDINE HCL 25 MG/ML IJ SOLN: 6.2500 mg | INTRAMUSCULAR | Status: DC | PRN

## 2013-09-07 MED ORDER — ACETAMINOPHEN 650 MG RE SUPP: 650.0000 mg | RECTAL | Status: DC | PRN

## 2013-09-07 MED ORDER — FENTANYL CITRATE 0.05 MG/ML IJ SOLN
INTRAMUSCULAR | Status: AC
  Filled 2013-09-07: qty 5

## 2013-09-07 MED ORDER — METHYLPREDNISOLONE 4 MG PO TABS
4.0000 mg | ORAL_TABLET | Freq: Two times a day (BID) | ORAL | Status: DC
  Administered 2013-09-07: 4 mg via ORAL
  Filled 2013-09-07 (×3): qty 1

## 2013-09-07 MED ORDER — LABETALOL HCL 5 MG/ML IV SOLN
INTRAVENOUS | Status: AC
  Administered 2013-09-07: 5 mg via INTRAVENOUS
  Filled 2013-09-07: qty 4

## 2013-09-07 MED ORDER — PROPOFOL 10 MG/ML IV BOLUS
INTRAVENOUS | Status: DC | PRN
  Administered 2013-09-07: 175 mg via INTRAVENOUS
  Administered 2013-09-07: 25 mg via INTRAVENOUS

## 2013-09-07 MED ORDER — METOPROLOL TARTRATE 50 MG PO TABS
50.0000 mg | ORAL_TABLET | Freq: Two times a day (BID) | ORAL | Status: DC
  Administered 2013-09-08 – 2013-09-13 (×9): 50 mg via ORAL
  Filled 2013-09-07 (×16): qty 1

## 2013-09-07 MED ORDER — OXYCODONE HCL 5 MG PO TABS: 5.0000 mg | ORAL_TABLET | Freq: Once | ORAL | Status: DC | PRN

## 2013-09-07 MED ORDER — GELATIN ABSORBABLE MT POWD
OROMUCOSAL | Status: DC | PRN
  Administered 2013-09-07: 17:00:00 via TOPICAL

## 2013-09-07 MED ORDER — LABETALOL HCL 5 MG/ML IV SOLN
10.0000 mg | INTRAVENOUS | Status: DC | PRN
Start: 2013-09-07 — End: 2013-09-13
  Administered 2013-09-10: 20 mg via INTRAVENOUS
  Administered 2013-09-10: 10 mg via INTRAVENOUS
  Administered 2013-09-12 (×2): 20 mg via INTRAVENOUS
  Administered 2013-09-13: 10 mg via INTRAVENOUS
  Filled 2013-09-07: qty 8
  Filled 2013-09-07 (×4): qty 4

## 2013-09-07 MED ORDER — ARTIFICIAL TEARS OP OINT
TOPICAL_OINTMENT | OPHTHALMIC | Status: DC | PRN
  Administered 2013-09-07: 1 via OPHTHALMIC

## 2013-09-07 MED ORDER — DOCUSATE SODIUM 100 MG PO CAPS
100.0000 mg | ORAL_CAPSULE | Freq: Two times a day (BID) | ORAL | Status: DC
  Filled 2013-09-07: qty 1

## 2013-09-07 MED ORDER — ONDANSETRON HCL 4 MG/2ML IJ SOLN: 4.0000 mg | Freq: Once | INTRAMUSCULAR | Status: DC | PRN

## 2013-09-07 MED ORDER — ACETAMINOPHEN 325 MG PO TABS: 650.0000 mg | ORAL_TABLET | Freq: Four times a day (QID) | ORAL | Status: DC | PRN

## 2013-09-07 MED ORDER — PANTOPRAZOLE SODIUM 40 MG IV SOLR
40.0000 mg | Freq: Every day | INTRAVENOUS | Status: DC
  Administered 2013-09-08: 40 mg via INTRAVENOUS
  Filled 2013-09-07 (×2): qty 40

## 2013-09-07 MED ORDER — METFORMIN HCL 500 MG PO TABS
500.0000 mg | ORAL_TABLET | Freq: Every day | ORAL | Status: DC
  Administered 2013-09-08 – 2013-09-13 (×6): 500 mg via ORAL
  Filled 2013-09-07 (×8): qty 1

## 2013-09-07 MED ORDER — ONDANSETRON HCL 4 MG/2ML IJ SOLN: 4.0000 mg | Freq: Four times a day (QID) | INTRAMUSCULAR | Status: DC | PRN

## 2013-09-07 MED ORDER — PROMETHAZINE HCL 25 MG PO TABS: 12.5000 mg | ORAL_TABLET | ORAL | Status: DC | PRN

## 2013-09-07 MED ORDER — SODIUM CHLORIDE 0.9 % IJ SOLN
INTRAMUSCULAR | Status: AC
  Filled 2013-09-07: qty 20

## 2013-09-07 MED ORDER — ONDANSETRON HCL 4 MG/2ML IJ SOLN
INTRAMUSCULAR | Status: DC | PRN
  Administered 2013-09-07: 4 mg via INTRAVENOUS

## 2013-09-07 MED ORDER — HYDROCODONE-ACETAMINOPHEN 5-325 MG PO TABS
1.0000 | ORAL_TABLET | Freq: Two times a day (BID) | ORAL | Status: DC
  Administered 2013-09-08 – 2013-09-13 (×10): 1 via ORAL
  Filled 2013-09-07 (×12): qty 1

## 2013-09-07 MED ORDER — LEVOTHYROXINE SODIUM 50 MCG PO TABS
50.0000 ug | ORAL_TABLET | Freq: Every day | ORAL | Status: DC
  Administered 2013-09-07 – 2013-09-13 (×7): 50 ug via ORAL
  Filled 2013-09-07 (×9): qty 1

## 2013-09-07 MED ORDER — ALPRAZOLAM 0.25 MG PO TABS
0.2500 mg | ORAL_TABLET | Freq: Two times a day (BID) | ORAL | Status: DC
  Administered 2013-09-07 – 2013-09-13 (×13): 0.25 mg via ORAL
  Filled 2013-09-07 (×14): qty 1

## 2013-09-07 MED ORDER — ONDANSETRON HCL 4 MG/2ML IJ SOLN
INTRAMUSCULAR | Status: AC
  Filled 2013-09-07: qty 2

## 2013-09-07 MED ORDER — POTASSIUM CHLORIDE IN NACL 20-0.9 MEQ/L-% IV SOLN
INTRAVENOUS | Status: DC
  Administered 2013-09-07: 04:00:00 via INTRAVENOUS
  Filled 2013-09-07 (×3): qty 1000

## 2013-09-07 MED ORDER — 0.9 % SODIUM CHLORIDE (POUR BTL) OPTIME
TOPICAL | Status: DC | PRN
  Administered 2013-09-07: 1000 mL

## 2013-09-07 MED ORDER — ACETAMINOPHEN 650 MG RE SUPP: 650.0000 mg | Freq: Four times a day (QID) | RECTAL | Status: DC | PRN

## 2013-09-07 MED ORDER — GLYCOPYRROLATE 0.2 MG/ML IJ SOLN
INTRAMUSCULAR | Status: AC
Start: 2013-09-07 — End: 2013-09-07
  Filled 2013-09-07: qty 3

## 2013-09-07 MED ORDER — ARTIFICIAL TEARS OP OINT
TOPICAL_OINTMENT | OPHTHALMIC | Status: AC
  Filled 2013-09-07: qty 3.5

## 2013-09-07 MED ORDER — ATORVASTATIN CALCIUM 40 MG PO TABS
40.0000 mg | ORAL_TABLET | Freq: Every day | ORAL | Status: DC
  Administered 2013-09-08 – 2013-09-13 (×6): 40 mg via ORAL
  Filled 2013-09-07 (×7): qty 1

## 2013-09-07 MED ORDER — ONDANSETRON HCL 4 MG PO TABS: 4.0000 mg | ORAL_TABLET | ORAL | Status: DC | PRN

## 2013-09-07 MED ORDER — FENTANYL CITRATE 0.05 MG/ML IJ SOLN
INTRAMUSCULAR | Status: DC | PRN
  Administered 2013-09-07: 100 ug via INTRAVENOUS
  Administered 2013-09-07: 50 ug via INTRAVENOUS
  Administered 2013-09-07: 100 ug via INTRAVENOUS
  Administered 2013-09-07 (×3): 50 ug via INTRAVENOUS

## 2013-09-07 MED ORDER — STERILE WATER FOR INJECTION IJ SOLN
INTRAMUSCULAR | Status: AC
  Filled 2013-09-07: qty 10

## 2013-09-07 MED ORDER — GLYCOPYRROLATE 0.2 MG/ML IJ SOLN
INTRAMUSCULAR | Status: DC | PRN
  Administered 2013-09-07: .6 mg via INTRAVENOUS

## 2013-09-07 MED ORDER — ROCURONIUM BROMIDE 50 MG/5ML IV SOLN
INTRAVENOUS | Status: AC
  Filled 2013-09-07: qty 1

## 2013-09-07 MED ORDER — NITROGLYCERIN IN D5W 200-5 MCG/ML-% IV SOLN
2.0000 ug/min | INTRAVENOUS | Status: AC
  Administered 2013-09-07: 5 ug/min via INTRAVENOUS
  Filled 2013-09-07 (×2): qty 250

## 2013-09-07 MED ORDER — DOCUSATE SODIUM 100 MG PO CAPS
100.0000 mg | ORAL_CAPSULE | Freq: Two times a day (BID) | ORAL | Status: DC
  Administered 2013-09-08 – 2013-09-13 (×8): 100 mg via ORAL
  Filled 2013-09-07 (×12): qty 1

## 2013-09-07 MED ORDER — HEMOSTATIC AGENTS (NO CHARGE) OPTIME
TOPICAL | Status: DC | PRN
  Administered 2013-09-07: 1 via TOPICAL

## 2013-09-07 MED ORDER — LABETALOL HCL 5 MG/ML IV SOLN
INTRAVENOUS | Status: AC
  Filled 2013-09-07: qty 8

## 2013-09-07 MED ORDER — HYDROCODONE-ACETAMINOPHEN 5-325 MG PO TABS
1.0000 | ORAL_TABLET | ORAL | Status: DC | PRN
  Administered 2013-09-07 – 2013-09-08 (×5): 1 via ORAL
  Administered 2013-09-09: 2 via ORAL
  Administered 2013-09-09: 1 via ORAL
  Administered 2013-09-10 – 2013-09-11 (×3): 2 via ORAL
  Administered 2013-09-11: 1 via ORAL
  Administered 2013-09-11: 2 via ORAL
  Administered 2013-09-11: 1 via ORAL
  Administered 2013-09-12: 2 via ORAL
  Administered 2013-09-12 (×2): 1 via ORAL
  Administered 2013-09-13: 2 via ORAL
  Filled 2013-09-07 (×2): qty 1
  Filled 2013-09-07 (×2): qty 2
  Filled 2013-09-07 (×2): qty 1
  Filled 2013-09-07: qty 2
  Filled 2013-09-07 (×2): qty 1
  Filled 2013-09-07 (×3): qty 2
  Filled 2013-09-07 (×2): qty 1
  Filled 2013-09-07 (×3): qty 2
  Filled 2013-09-07: qty 1

## 2013-09-07 MED ORDER — THROMBIN 5000 UNITS EX SOLR
CUTANEOUS | Status: DC | PRN
  Administered 2013-09-07 (×2): 5000 [IU] via TOPICAL

## 2013-09-07 MED ORDER — LABETALOL HCL 5 MG/ML IV SOLN
5.0000 mg | INTRAVENOUS | Status: DC | PRN
  Administered 2013-09-07 (×3): 5 mg via INTRAVENOUS

## 2013-09-07 MED ORDER — LIDOCAINE HCL (CARDIAC) 20 MG/ML IV SOLN
INTRAVENOUS | Status: DC | PRN
  Administered 2013-09-07: 50 mg via INTRAVENOUS

## 2013-09-07 MED ORDER — CEFAZOLIN SODIUM 1-5 GM-% IV SOLN
INTRAVENOUS | Status: AC
  Administered 2013-09-07: 2 g via INTRAVENOUS
  Filled 2013-09-07: qty 100

## 2013-09-07 MED ORDER — ONDANSETRON HCL 4 MG/2ML IJ SOLN
4.0000 mg | INTRAMUSCULAR | Status: DC | PRN
  Administered 2013-09-08 (×2): 4 mg via INTRAVENOUS
  Filled 2013-09-07 (×2): qty 2

## 2013-09-07 MED ORDER — NEOSTIGMINE METHYLSULFATE 1 MG/ML IJ SOLN
INTRAMUSCULAR | Status: DC | PRN
Start: 2013-09-07 — End: 2013-09-07
  Administered 2013-09-07: 4 mg via INTRAVENOUS

## 2013-09-07 MED ORDER — OXYCODONE HCL 5 MG/5ML PO SOLN: 5.0000 mg | Freq: Once | ORAL | Status: DC | PRN

## 2013-09-07 MED ORDER — BISACODYL 10 MG RE SUPP
10.0000 mg | Freq: Every day | RECTAL | Status: DC | PRN
Start: 2013-09-07 — End: 2013-09-13

## 2013-09-07 MED ORDER — POTASSIUM CHLORIDE IN NACL 20-0.9 MEQ/L-% IV SOLN
INTRAVENOUS | Status: DC
  Administered 2013-09-08: 01:00:00 via INTRAVENOUS
  Filled 2013-09-07 (×4): qty 1000

## 2013-09-07 MED ORDER — HYDROCODONE-ACETAMINOPHEN 5-325 MG PO TABS: 1.0000 | ORAL_TABLET | ORAL | Status: DC | PRN

## 2013-09-07 MED ORDER — NEOSTIGMINE METHYLSULFATE 1 MG/ML IJ SOLN
INTRAMUSCULAR | Status: AC
  Filled 2013-09-07: qty 10

## 2013-09-07 MED ORDER — LIDOCAINE-EPINEPHRINE 1 %-1:100000 IJ SOLN
INTRAMUSCULAR | Status: DC | PRN
  Administered 2013-09-07: 3 mL

## 2013-09-07 MED ORDER — HYDROMORPHONE HCL PF 1 MG/ML IJ SOLN
0.2500 mg | INTRAMUSCULAR | Status: DC | PRN
  Administered 2013-09-07: 0.5 mg via INTRAVENOUS
  Administered 2013-09-07: 0.25 mg via INTRAVENOUS
  Administered 2013-09-07 (×2): 0.5 mg via INTRAVENOUS

## 2013-09-07 MED ORDER — PROPOFOL 10 MG/ML IV BOLUS
INTRAVENOUS | Status: AC
  Filled 2013-09-07: qty 20

## 2013-09-07 MED ORDER — ACETAMINOPHEN 325 MG PO TABS: 650.0000 mg | ORAL_TABLET | ORAL | Status: DC | PRN

## 2013-09-07 MED ORDER — RAMIPRIL 10 MG PO CAPS
10.0000 mg | ORAL_CAPSULE | Freq: Two times a day (BID) | ORAL | Status: DC
  Administered 2013-09-08 – 2013-09-13 (×11): 10 mg via ORAL
  Filled 2013-09-07 (×15): qty 1

## 2013-09-07 MED ORDER — SODIUM CHLORIDE 0.9 % IV SOLN
INTRAVENOUS | Status: DC | PRN
  Administered 2013-09-07: 15:00:00 via INTRAVENOUS

## 2013-09-07 MED ORDER — BACITRACIN ZINC 500 UNIT/GM EX OINT
TOPICAL_OINTMENT | CUTANEOUS | Status: DC | PRN
  Administered 2013-09-07: 1 via TOPICAL

## 2013-09-07 MED ORDER — INSULIN ASPART 100 UNIT/ML ~~LOC~~ SOLN
0.0000 [IU] | SUBCUTANEOUS | Status: DC
  Administered 2013-09-08 (×4): 2 [IU] via SUBCUTANEOUS
  Administered 2013-09-08: 5 [IU] via SUBCUTANEOUS
  Administered 2013-09-08: 2 [IU] via SUBCUTANEOUS

## 2013-09-07 MED ORDER — ROCURONIUM BROMIDE 100 MG/10ML IV SOLN
INTRAVENOUS | Status: DC | PRN
  Administered 2013-09-07: 50 mg via INTRAVENOUS

## 2013-09-07 MED ORDER — NICARDIPINE HCL IN NACL 20-0.86 MG/200ML-% IV SOLN
3.0000 mg/h | INTRAVENOUS | Status: DC
  Administered 2013-09-07: 3 mg/h via INTRAVENOUS
  Filled 2013-09-07: qty 200

## 2013-09-07 MED ORDER — VECURONIUM BROMIDE 10 MG IV SOLR
INTRAVENOUS | Status: DC | PRN
  Administered 2013-09-07: 1 mg via INTRAVENOUS
  Administered 2013-09-07: 2 mg via INTRAVENOUS

## 2013-09-07 MED ORDER — INSULIN ASPART 100 UNIT/ML ~~LOC~~ SOLN: 0.0000 [IU] | SUBCUTANEOUS | Status: DC

## 2013-09-07 MED ORDER — ONDANSETRON HCL 4 MG PO TABS: 4.0000 mg | ORAL_TABLET | Freq: Four times a day (QID) | ORAL | Status: DC | PRN

## 2013-09-07 MED ORDER — MORPHINE SULFATE 2 MG/ML IJ SOLN
1.0000 mg | INTRAMUSCULAR | Status: DC | PRN
  Administered 2013-09-08 – 2013-09-12 (×3): 2 mg via INTRAVENOUS
  Filled 2013-09-07 (×4): qty 1

## 2013-09-07 MED ORDER — LABETALOL HCL 5 MG/ML IV SOLN
INTRAVENOUS | Status: DC | PRN
  Administered 2013-09-07 (×2): 2.5 mg via INTRAVENOUS
  Administered 2013-09-07: 5 mg via INTRAVENOUS
  Administered 2013-09-07 (×2): 2.5 mg via INTRAVENOUS
  Administered 2013-09-07 (×2): 5 mg via INTRAVENOUS
  Administered 2013-09-07: 7.5 mg via INTRAVENOUS

## 2013-09-07 MED ORDER — GLYCOPYRROLATE 0.2 MG/ML IJ SOLN
INTRAMUSCULAR | Status: AC
  Filled 2013-09-07: qty 4

## 2013-09-07 MED ORDER — HYDROCORTISONE NA SUCCINATE PF 100 MG IJ SOLR
50.0000 mg | Freq: Two times a day (BID) | INTRAMUSCULAR | Status: DC
  Administered 2013-09-08 – 2013-09-09 (×5): 50 mg via INTRAVENOUS
  Filled 2013-09-07 (×8): qty 1

## 2013-09-07 MED ORDER — SENNOSIDES-DOCUSATE SODIUM 8.6-50 MG PO TABS
1.0000 | ORAL_TABLET | Freq: Every evening | ORAL | Status: DC | PRN
  Administered 2013-09-08: 1 via ORAL
  Filled 2013-09-07: qty 1

## 2013-09-07 MED ORDER — VECURONIUM BROMIDE 10 MG IV SOLR
INTRAVENOUS | Status: AC
  Filled 2013-09-07: qty 10

## 2013-09-07 MED ORDER — CEPHALEXIN 250 MG PO CAPS
250.0000 mg | ORAL_CAPSULE | Freq: Three times a day (TID) | ORAL | Status: DC
  Filled 2013-09-07 (×5): qty 1

## 2013-09-07 MED ORDER — CEPHALEXIN 250 MG PO CAPS
250.0000 mg | ORAL_CAPSULE | Freq: Three times a day (TID) | ORAL | Status: DC
  Administered 2013-09-08 – 2013-09-13 (×20): 250 mg via ORAL
  Filled 2013-09-07 (×25): qty 1

## 2013-09-07 SURGICAL SUPPLY — 115 items
ADH SKN CLS APL DERMABOND .7 (GAUZE/BANDAGES/DRESSINGS) ×1
APL SKNCLS STERI-STRIP NONHPOA (GAUZE/BANDAGES/DRESSINGS) ×1
ATTRACTOMAT 16X20 MAGNETIC DRP (DRAPES) IMPLANT
BALL CTTN LRG ABS STRL LF (GAUZE/BANDAGES/DRESSINGS)
BENZOIN TINCTURE PRP APPL 2/3 (GAUZE/BANDAGES/DRESSINGS) ×3 IMPLANT
BLADE EYE SICKLE 84 5 BEAV (BLADE) IMPLANT
BLADE EYE SICKLE 84 5MM BEAV (BLADE)
BLADE SURG 10 STRL SS (BLADE) ×3 IMPLANT
BLADE SURG 11 STRL SS (BLADE) ×6 IMPLANT
BLADE SURG 15 STRL LF DISP TIS (BLADE) ×1 IMPLANT
BLADE SURG 15 STRL SS (BLADE) ×3
CANISTER SUCT 3000ML (MISCELLANEOUS) ×1 IMPLANT
CANISTER SUCTION 2500CC (MISCELLANEOUS) ×3 IMPLANT
CATH ROBINSON RED A/P 10FR (CATHETERS) ×3 IMPLANT
CATH ROBINSON RED A/P 14FR (CATHETERS) IMPLANT
CLOSURE WOUND 1/2 X4 (GAUZE/BANDAGES/DRESSINGS)
CLOSURE WOUND 1/4X4 (GAUZE/BANDAGES/DRESSINGS)
COAGULATOR SUCT 6 FR SWTCH (ELECTROSURGICAL)
COAGULATOR SUCT SWTCH 10FR 6 (ELECTROSURGICAL) IMPLANT
CONT SPEC 4OZ CLIKSEAL STRL BL (MISCELLANEOUS) ×6 IMPLANT
CORDS BIPOLAR (ELECTRODE) ×5 IMPLANT
COTTONBALL LRG STERILE PKG (GAUZE/BANDAGES/DRESSINGS) ×1 IMPLANT
CRADLE DONUT ADULT HEAD (MISCELLANEOUS) ×1 IMPLANT
DEPRESSOR TONGUE BLADE STERILE (MISCELLANEOUS) ×1 IMPLANT
DERMABOND ADVANCED (GAUZE/BANDAGES/DRESSINGS) ×2
DERMABOND ADVANCED .7 DNX12 (GAUZE/BANDAGES/DRESSINGS) IMPLANT
DRAIN SUBARACHNOID (WOUND CARE) IMPLANT
DRAPE C-ARM 42X72 X-RAY (DRAPES) ×3 IMPLANT
DRAPE EENT ADH APERT 15X15 STR (DRAPES) IMPLANT
DRAPE INCISE IOBAN 66X45 STRL (DRAPES) ×3 IMPLANT
DRAPE MICROSCOPE LEICA (MISCELLANEOUS) ×3 IMPLANT
DRAPE POUCH INSTRU U-SHP 10X18 (DRAPES) ×3 IMPLANT
DRAPE PROXIMA HALF (DRAPES) ×6 IMPLANT
DRAPE WARM FLUID 44X44 (DRAPE) ×3 IMPLANT
DRESSING TELFA 8X3 (GAUZE/BANDAGES/DRESSINGS) ×5 IMPLANT
DURAPREP 26ML APPLICATOR (WOUND CARE) ×6 IMPLANT
ELECT CAUTERY BLADE 6.4 (BLADE) ×2 IMPLANT
ELECT COATED BLADE 2.86 ST (ELECTRODE) ×3 IMPLANT
ELECT NEEDLE TIP 2.8 STRL (NEEDLE) ×3 IMPLANT
ELECT REM PT RETURN 9FT ADLT (ELECTROSURGICAL) ×3
ELECTRODE REM PT RTRN 9FT ADLT (ELECTROSURGICAL) ×1 IMPLANT
GAUZE PACKING FOLDED 1IN STRL (GAUZE/BANDAGES/DRESSINGS) ×3 IMPLANT
GAUZE PACKING FOLDED 2  STR (GAUZE/BANDAGES/DRESSINGS) ×2
GAUZE PACKING FOLDED 2 STR (GAUZE/BANDAGES/DRESSINGS) ×1 IMPLANT
GAUZE SPONGE 2X2 8PLY STRL LF (GAUZE/BANDAGES/DRESSINGS) ×2 IMPLANT
GLOVE BIO SURGEON STRL SZ8 (GLOVE) ×6 IMPLANT
GLOVE BIOGEL PI IND STRL 8 (GLOVE) ×1 IMPLANT
GLOVE BIOGEL PI IND STRL 8.5 (GLOVE) ×2 IMPLANT
GLOVE BIOGEL PI INDICATOR 8 (GLOVE)
GLOVE BIOGEL PI INDICATOR 8.5 (GLOVE) ×4
GLOVE ECLIPSE 7.5 STRL STRAW (GLOVE) ×2 IMPLANT
GLOVE EXAM NITRILE LRG STRL (GLOVE) IMPLANT
GLOVE EXAM NITRILE MD LF STRL (GLOVE) IMPLANT
GLOVE EXAM NITRILE XL STR (GLOVE) IMPLANT
GLOVE EXAM NITRILE XS STR PU (GLOVE) IMPLANT
GLOVE OPTIFIT SS 6.5 STRL BRWN (GLOVE) ×8 IMPLANT
GOWN BRE IMP SLV AUR LG STRL (GOWN DISPOSABLE) IMPLANT
GOWN BRE IMP SLV AUR XL STRL (GOWN DISPOSABLE) IMPLANT
GOWN STRL REIN 2XL LVL4 (GOWN DISPOSABLE) IMPLANT
GOWN STRL REUS W/ TWL LRG LVL3 (GOWN DISPOSABLE) ×2 IMPLANT
GOWN STRL REUS W/ TWL XL LVL3 (GOWN DISPOSABLE) IMPLANT
GOWN STRL REUS W/TWL LRG LVL3 (GOWN DISPOSABLE) ×6
GOWN STRL REUS W/TWL MED LVL3 (GOWN DISPOSABLE) ×2 IMPLANT
GOWN STRL REUS W/TWL XL LVL3 (GOWN DISPOSABLE) ×3
HEMOSTAT SURGICEL 2X14 (HEMOSTASIS) IMPLANT
HOOK DURA (MISCELLANEOUS) ×3 IMPLANT
KIT BASIN OR (CUSTOM PROCEDURE TRAY) ×4 IMPLANT
KIT ROOM TURNOVER OR (KITS) ×6 IMPLANT
MARKER SKIN DUAL TIP RULER LAB (MISCELLANEOUS) ×3 IMPLANT
NDL HYPO 25X1 1.5 SAFETY (NEEDLE) ×1 IMPLANT
NDL SPNL 22GX3.5 QUINCKE BK (NEEDLE) ×1 IMPLANT
NEEDLE 27GAX1X1/2 (NEEDLE) ×3 IMPLANT
NEEDLE HYPO 25X1 1.5 SAFETY (NEEDLE) ×3 IMPLANT
NEEDLE SPNL 22GX3.5 QUINCKE BK (NEEDLE) ×3 IMPLANT
NS IRRIG 1000ML POUR BTL (IV SOLUTION) ×4 IMPLANT
PAD ARMBOARD 7.5X6 YLW CONV (MISCELLANEOUS) ×9 IMPLANT
PATTIES SURGICAL .25X.25 (GAUZE/BANDAGES/DRESSINGS) ×3 IMPLANT
PATTIES SURGICAL .5 X.5 (GAUZE/BANDAGES/DRESSINGS) ×3 IMPLANT
PATTIES SURGICAL .5 X3 (DISPOSABLE) ×6 IMPLANT
PENCIL BUTTON HOLSTER BLD 10FT (ELECTRODE) ×3 IMPLANT
PIN MAYFIELD SKULL DISP (PIN) ×2 IMPLANT
RUBBERBAND STERILE (MISCELLANEOUS) ×6 IMPLANT
SHEET SIL 040 (INSTRUMENTS) IMPLANT
SPECIMEN JAR SMALL (MISCELLANEOUS) IMPLANT
SPONGE GAUZE 2X2 STER 10/PKG (GAUZE/BANDAGES/DRESSINGS)
SPONGE GAUZE 4X4 12PLY (GAUZE/BANDAGES/DRESSINGS) ×3 IMPLANT
SPONGE LAP 4X18 X RAY DECT (DISPOSABLE) ×3 IMPLANT
SPONGE SURGIFOAM ABS GEL 12-7 (HEMOSTASIS) ×2 IMPLANT
STAPLER SKIN PROX WIDE 3.9 (STAPLE) ×3 IMPLANT
STRIP CLOSURE SKIN 1/2X4 (GAUZE/BANDAGES/DRESSINGS) IMPLANT
STRIP CLOSURE SKIN 1/4X4 (GAUZE/BANDAGES/DRESSINGS) IMPLANT
SUT BONE WAX W31G (SUTURE) ×3 IMPLANT
SUT CHROMIC 3 0 PS 2 (SUTURE) IMPLANT
SUT CHROMIC 4 0 P 3 18 (SUTURE) ×4 IMPLANT
SUT ETHILON 3 0 PS 1 (SUTURE) IMPLANT
SUT ETHILON 4 0 PS 2 18 (SUTURE) IMPLANT
SUT ETHILON 6 0 P 1 (SUTURE) IMPLANT
SUT NOVAFIL 6 0 PRE 2 4412 13 (SUTURE) IMPLANT
SUT PLAIN 4 0 ~~LOC~~ 1 (SUTURE) ×6 IMPLANT
SUT PROLENE 6 0 BV (SUTURE) IMPLANT
SUT VIC AB 2-0 CP2 18 (SUTURE) ×3 IMPLANT
SUT VIC AB 2-0 CT1 27 (SUTURE)
SUT VIC AB 2-0 CT1 27XBRD (SUTURE) IMPLANT
SUT VIC AB 3-0 SH 8-18 (SUTURE) ×3 IMPLANT
SUT VIC AB 4-0 P-3 18X BRD (SUTURE) IMPLANT
SUT VIC AB 4-0 P3 18 (SUTURE)
SYR 5ML LL (SYRINGE) ×3 IMPLANT
SYR CONTROL 10ML LL (SYRINGE) ×3 IMPLANT
SYR TB 1ML 25GX5/8 (SYRINGE) IMPLANT
TOWEL OR 17X24 6PK STRL BLUE (TOWEL DISPOSABLE) ×4 IMPLANT
TOWEL OR 17X26 10 PK STRL BLUE (TOWEL DISPOSABLE) ×6 IMPLANT
TRAY ENT MC OR (CUSTOM PROCEDURE TRAY) ×4 IMPLANT
TRAY FOLEY CATH 14FRSI W/METER (CATHETERS) ×3 IMPLANT
UNDERPAD 30X30 INCONTINENT (UNDERPADS AND DIAPERS) ×1 IMPLANT
WATER STERILE IRR 1000ML POUR (IV SOLUTION) ×5 IMPLANT

## 2013-09-07 NOTE — Progress Notes (Signed)
I have spoken to patient's daughter about her condition and need for surgery.  She plans to come today from the [facility].

## 2013-09-07 NOTE — Brief Op Note (Signed)
09/06/2013 - 09/07/2013  6:29 PM  PATIENT:  Diane Fry  80 y.o. female  PRE-OPERATIVE DIAGNOSIS:  Pituitary tumor  POST-OPERATIVE DIAGNOSIS:  Pituitary tumor  PROCEDURE:  Procedure(s) with comments: TRANSSPHENOIDAL RESECTION OF PITUITARY MACROADENOMA WITH ABDOMINAL FAT GRAPH (N/A) - CRANIOTOMY HYPOPHYSECTOMY TRANSNASAL APPROACH TRANSNASAL APPROACH (N/A)  SURGEON:  Surgeon(s) and Role: Panel 1:    * Erline Levine, MD - Primary    * Consuella Lose, MD - Assisting  Panel 2:    * Izora Gala, MD - Primary  PHYSICIAN ASSISTANT:   ASSISTANTS: none   ANESTHESIA:   general  EBL:  Total I/O In: 1875 [I.V.:1875] Out: 1010 [Urine:510; Blood:500]  BLOOD ADMINISTERED:none  DRAINS: none   LOCAL MEDICATIONS USED:  LIDOCAINE   SPECIMEN:  Excision  DISPOSITION OF SPECIMEN:  PATHOLOGY  COUNTS:  YES  TOURNIQUET:  * No tourniquets in log *  DICTATION: Patient is a 80 year old woman with a pituitary macroadenoma with optic chiasmatic compression.  It was elected to take her to surgery for trans-sphenoidal resection of pituitary tumor with abdominal fat graft harvest.  Dr. Constance Holster performed exposure and will dictate his portion of the surgery separately.  PROCEDURE:  Patient was brought to the operating room and following the smooth and uncomplicated induction of general endotracheal anesthesia, her head was placed on a horseshoe head holder.  Dr. Constance Holster performed the exposure to the tumor.  The surgery was performed with the aid of the operating microscope.  Upon accessing the floor of the sella, it was clear that the bone was softened and eroded.  The dura was unroofed with a 2 mm Gold-tipped Kerrison rongeur. Hemostasis was assured and the dura was then opened in a cruciate fashion exposing friable, necrotic appearing tumor.  Multiple samples were obtained for pathology.  A variety of angled pituitary curets were then used to remove additional tumor. The arachnoid layer was seen to be  descending into the field and at this point, extent of resection was confirmed with C-arm.  Dr. Kathyrn Sheriff also confirmed that the tumor appeared to be completely removed.  Surgifoam was used to assure hemostasis.  An abdominal fat graft was harvested and the abdominal incision was closed with interrupted 2-0 and 3-0 vicryl stitches.  A small amount of fat was placed within the sellar defect.  The bony edges were reconstructed with some of the nasal bone retained from the exposure.  A larger piece of fat was used to fill the sphenoid sinus.  Dr. Constance Holster then closed.    The abdominal wound was dressed with Dermabond.  The patient was extubated and taken to recovery in stable and satisfactory condition, having tolerated the surgery well.  Counts were correct at the end of the case.  PLAN OF CARE: Admit to inpatient   PATIENT DISPOSITION:  PACU - hemodynamically stable.   Delay start of Pharmacological VTE agent (>24hrs) due to surgical blood loss or risk of bleeding: yes

## 2013-09-07 NOTE — Preoperative (Signed)
Beta Blockers   Reason not to administer Beta Blockers:Not Applicable 

## 2013-09-07 NOTE — Op Note (Signed)
OPERATIVE REPORT  DATE OF SURGERY: 09/07/2013  PATIENT:  Diane Fry,  80 y.o. female  PRE-OPERATIVE DIAGNOSIS:  Pituitary tumor  POST-OPERATIVE DIAGNOSIS:  Pituitary tumor  PROCEDURE:  Procedure(s): TRANSSPHENOIDAL RESECTION OF PITUITARY MACROADENOMA WITH ABDOMINAL FAT GRAPH TRANSNASAL APPROACH  SURGEON:  Beckie Salts, MD  ASSISTANTS: none  ANESTHESIA:   General   EBL:  500 ml  DRAINS: none  LOCAL MEDICATIONS USED:  One percent Xylocaine with epinephrine  SPECIMEN:  none  COUNTS:  Correct  PROCEDURE DETAILS: The patient was taken to the operating room and placed on the operating table in the supine position. General endotracheal anesthesia was induced. Following stabilization of the head in surgical pins, the nose was draped in the standard fashion. Xylocaine with epinephrine was infiltrated into the septum the columella and the inferior turbinate on the left. Afrin pledgets were replaced bilaterally. A left hemitransfixion incision was created using a 15 scalpel and the mucoperichondrial flap was elevated posteriorly to the sphenoid rostrum.  There was a tear of the posterior mucosa on the left the corresponding mucosa on the right side was kept intact. A gullwing incision was outlined through the thin part of the columella. A #11 scalpel was used to incise the skin edges of scissors were used to elevate the skin and separated from the cartilage. This allowed grater mobility of the nasal vestibule and was important for avoiding pressure necrosis. Pituitary speculum was placed into the nasal cavity and secured in place exposing the sphenoid rostrum. A chisel was used to remove part of the posterior septum exposing the sphenoid sinus. There is very tough mucosa present within the sinus. Kerrison rongeur was used to enlarge the opening creating adequate visualization  towards the posterior wall. The intersinus septum was several millimeters left of midline.  The neurosurgical  portion of procedure was then performed and dictated in a separate note.  The sphenoid sinus was packed with abdominal fat. The mucosal flaps were quilted with plain gut suture. The mucosal incisions were reapproximated with interrupted chromic suture. The columellar incision was reapproximated with interrupted 6-0 nylon. The nasal cavities were packed with rolled up Telfa coated bacitracin ointment. The pharynx was suctioned of blood and secretions under direct visualization.    PATIENT DISPOSITION:  To Neuro ICU, stable

## 2013-09-07 NOTE — Anesthesia Preprocedure Evaluation (Addendum)
Anesthesia Evaluation  Patient identified by MRN, date of birth, ID band Patient awake    Reviewed: Allergy & Precautions, H&P , NPO status , Patient's Chart, lab work & pertinent test results, reviewed documented beta blocker date and time   History of Anesthesia Complications Negative for: history of anesthetic complications  Airway Mallampati: II TM Distance: >3 FB Neck ROM: Full    Dental  (+) Edentulous Upper, Edentulous Lower   Pulmonary neg pulmonary ROS,  breath sounds clear to auscultation        Cardiovascular hypertension, Pt. on medications and Pt. on home beta blockers + CAD, + Past MI ('04) and + Cardiac Stents ('04 LAD stent and subsequent PCI of stent) PERIPHERAL VASCULAR DISEASE: iliac stents. Rhythm:Regular Rate:Bradycardia  '04 ECHO: normal LVF, normal valves   Neuro/Psych  Headaches, Homonymous hemianopsia: pituitary adenoma    GI/Hepatic negative GI ROS, Neg liver ROS,   Endo/Other  diabetes (glu 92), Oral Hypoglycemic AgentsHypothyroidism Morbid obesity  Renal/GU negative Renal ROS     Musculoskeletal   Abdominal (+) + obese,   Peds  Hematology   Anesthesia Other Findings   Reproductive/Obstetrics                       Anesthesia Physical Anesthesia Plan  ASA: III  Anesthesia Plan: General   Post-op Pain Management:    Induction: Intravenous  Airway Management Planned: Oral ETT  Additional Equipment: Arterial line  Intra-op Plan:   Post-operative Plan: Extubation in OR  Informed Consent: I have reviewed the patients History and Physical, chart, labs and discussed the procedure including the risks, benefits and alternatives for the proposed anesthesia with the patient or authorized representative who has indicated his/her understanding and acceptance.   Dental Advisory Given  Plan Discussed with: Surgeon, CRNA and Anesthesiologist  Anesthesia Plan Comments:  (Plan routine monitors, A line, GETA)       Anesthesia Quick Evaluation

## 2013-09-07 NOTE — Anesthesia Postprocedure Evaluation (Signed)
Anesthesia Post Note  Patient: Diane Fry  Procedure(s) Performed: Procedure(s) (LRB): TRANSSPHENOIDAL RESECTION OF PITUITARY MACROADENOMA WITH ABDOMINAL FAT GRAPH (N/A) TRANSNASAL APPROACH (N/A)  Anesthesia type: general  Patient location: PACU  Post pain: Pain level controlled  Post assessment: Patient's Cardiovascular Status Stable  Last Vitals:  Filed Vitals:   09/07/13 2000  BP:   Pulse: 57  Temp:   Resp: 11    Post vital signs: Reviewed and stable  Level of consciousness: sedated  Complications: No apparent anesthesia complications

## 2013-09-07 NOTE — Anesthesia Procedure Notes (Signed)
Procedure Name: Intubation Date/Time: 09/07/2013 3:42 PM Performed by: Neldon Newport Pre-anesthesia Checklist: Patient identified, Timeout performed, Emergency Drugs available, Suction available and Patient being monitored Patient Re-evaluated:Patient Re-evaluated prior to inductionOxygen Delivery Method: Circle system utilized Preoxygenation: Pre-oxygenation with 100% oxygen Intubation Type: IV induction Ventilation: Mask ventilation without difficulty Laryngoscope Size: Mac and 3 Grade View: Grade I Tube size: 7.0 mm Number of attempts: 1 Placement Confirmation: ETT inserted through vocal cords under direct vision,  positive ETCO2 and breath sounds checked- equal and bilateral Secured at: 21 cm Tube secured with: Tape Dental Injury: Teeth and Oropharynx as per pre-operative assessment

## 2013-09-07 NOTE — H&P (Addendum)
Reason for Consult:Visual field deficit with pituitary tumor Referring Physician: [name] is an 80 y.o. female.  HPI: HPI Comments: Patient is postop day 8 following cataract surgery on her left eye. About one week ago began having a headache persistently waxing and waning over the right posterior head area. She reports she does not typically get headaches. She has been taking BC powder that seemed to improve the pain. She saw her primary care physician this Monday and was told she had some fluid behind her ear and was put on antibiotics for a sinus infection which she has a strong prior history of. She saw her ophthalmologist today who dilated her left eye into reported that her vision was successfully repaired however discovered that she had a visual field deficits on the left side that seemingly involved both eyes. Patient denies slurred speech, focal numbness or weakness of her face arms or legs. She was sent here by her ophthalmologist due to the suspicion of a possible stroke. The patient does have a history of hypertension and diabetes. Family is present as well who reports  The history is provided by the patient and her friend.  Patient's daughter lives in [place], otherwise she has no family locally.    Past Medical History  Diagnosis Date  . Hypertension   . Coronary artery disease   . Diabetes mellitus without complication     History reviewed. No pertinent past surgical history.  History reviewed. No pertinent family history.  Social History:  reports that she has never smoked. She does not have any smokeless tobacco history on file. She reports that she does not drink alcohol. Her drug history is not on file.  Allergies: No Known Allergies  Medications: I have reviewed the patient's current medications.  Results for orders placed during the hospital encounter of 09/06/13 (from the past 48 hour(s))  I-STAT CHEM 8, ED     Status: Abnormal   Collection Time     09/06/13  7:32 PM      Result Value Ref Range   Sodium 134 (*) 137 - 147 mEq/L   Potassium 4.3  3.7 - 5.3 mEq/L   Chloride 93 (*) 96 - 112 mEq/L   BUN 35 (*) 6 - 23 mg/dL   Creatinine, Ser 0.90  0.50 - 1.10 mg/dL   Glucose, Bld 88  70 - 99 mg/dL   Calcium, Ion 1.21  1.13 - 1.30 mmol/L   TCO2 33  0 - 100 mmol/L   Hemoglobin 15.3 (*) 12.0 - 15.0 g/dL   HCT 45.0  36.0 - 46.0 %  CBC WITH DIFFERENTIAL     Status: Abnormal   Collection Time    09/06/13  9:54 PM      Result Value Ref Range   WBC 13.3 (*) 4.0 - 10.5 K/uL   RBC 4.62  3.87 - 5.11 MIL/uL   Hemoglobin 15.2 (*) 12.0 - 15.0 g/dL   HCT 43.6  36.0 - 46.0 %   MCV 94.4  78.0 - 100.0 fL   MCH 32.9  26.0 - 34.0 pg   MCHC 34.9  30.0 - 36.0 g/dL   RDW 13.6  11.5 - 15.5 %   Platelets 216  150 - 400 K/uL   Neutrophils Relative % 67  43 - 77 %   Neutro Abs 8.9 (*) 1.7 - 7.7 K/uL   Lymphocytes Relative 23  12 - 46 %   Lymphs Abs 3.0  0.7 - 4.0 K/uL  Monocytes Relative 10  3 - 12 %   Monocytes Absolute 1.3 (*) 0.1 - 1.0 K/uL   Eosinophils Relative 1  0 - 5 %   Eosinophils Absolute 0.1  0.0 - 0.7 K/uL   Basophils Relative 0  0 - 1 %   Basophils Absolute 0.0  0.0 - 0.1 K/uL  BASIC METABOLIC PANEL     Status: Abnormal   Collection Time    09/06/13  9:54 PM      Result Value Ref Range   Sodium 129 (*) 137 - 147 mEq/L   Potassium 4.3  3.7 - 5.3 mEq/L   Chloride 90 (*) 96 - 112 mEq/L   CO2 28  19 - 32 mEq/L   Glucose, Bld 87  70 - 99 mg/dL   BUN 28 (*) 6 - 23 mg/dL   Creatinine, Ser 0.70  0.50 - 1.10 mg/dL   Calcium 9.4  8.4 - 10.5 mg/dL   GFR calc non Af Amer 78 (*) >90 mL/min   GFR calc Af Amer >90  >90 mL/min   Comment: (NOTE)     The eGFR has been calculated using the CKD EPI equation.     This calculation has not been validated in all clinical situations.     eGFR's persistently <90 mL/min signify possible Chronic Kidney     Disease.  APTT     Status: None   Collection Time    09/06/13  9:54 PM      Result Value Ref  Range   aPTT 24  24 - 37 seconds  PROTIME-INR     Status: None   Collection Time    09/06/13  9:54 PM      Result Value Ref Range   Prothrombin Time 13.4  11.6 - 15.2 seconds   INR 1.04  0.00 - 1.49    Mr Diane Fry YD Contrast  09/06/2013   CLINICAL DATA:  Recent cataract surgery 08/29/2013.  Headache.  EXAM: MRI HEAD WITHOUT AND WITH CONTRAST  TECHNIQUE: Multiplanar, multiecho pulse sequences of the brain and surrounding structures were obtained without and with intravenous contrast.  CONTRAST:  11.55m MULTIHANCE GADOBENATE DIMEGLUMINE 529 MG/ML IV SOLN  COMPARISON:  None.  FINDINGS: Large mass arising from the sella extending into the suprasellar cistern. The sella is enlarged. The mass shows heterogeneous enhancement following contrast infusion. There is compression of the optic chiasm. There is invasion of the right cavernous sinus. The mass measures 1.9 x 1.5 x 3.1 cm. 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 There is a mild amount of hemorrhage in the tumor.  Negative for acute infarct.  There is generalized atrophy. Chronic microvascular ischemic change in the white matter. Ventricle size is appropriate for the level of atrophy. No shift of the midline structures. No other enhancing lesions are seen in the brain following contrast administration.  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 There is a mild amount of hemorrhage in the tumor.  Atrophy and chronic microvascular ischemia in the white matter. No acute infarct.  I discussed the findings with Dr. GDorna Mai  Electronically Signed   By: CFranchot GalloM.D.   On: 09/06/2013 20:42    Review of Systems - Negative except as above    Blood pressure 121/87, pulse 59, temperature 98.1 F (36.7 C), resp. rate 20, weight 77.565 kg (171 lb), SpO2 99.00%. Physical Exam  Constitutional: She is oriented to person, place,  and time. She appears well-developed and  well-nourished.  HENT:  Head: Normocephalic and atraumatic.  Right Ear: External ear normal.  Left Ear: External ear normal.  Nose: Nose normal.  Mouth/Throat: Oropharynx is clear and moist.  Eyes: Conjunctivae and EOM are normal. Pupils are equal, round, and reactive to light.  Bitemporal hemianopsia, more dense on left with sparing of inferior quadrant on right.  Sharp disc OS, cloudy due to cataract OD.  Neck: Normal range of motion and full passive range of motion without pain. Neck supple.  Cardiovascular: Normal rate and regular rhythm.   Respiratory: Effort normal and breath sounds normal.  GI: Soft. Normal appearance.  Neurological: She is alert and oriented to person, place, and time. She has normal strength. No cranial nerve deficit or sensory deficit. GCS eye subscore is 4. GCS verbal subscore is 5. GCS motor subscore is 6.  Reflex Scores:      Tricep reflexes are 2+ on the right side and 2+ on the left side.      Bicep reflexes are 2+ on the right side and 2+ on the left side.      Brachioradialis reflexes are 2+ on the right side and 2+ on the left side.      Patellar reflexes are 2+ on the right side and 2+ on the left side.      Achilles reflexes are 2+ on the right side. Skin: Skin is warm, dry and intact.  Psychiatric: She has a normal mood and affect. Her speech is normal and behavior is normal. Judgment and thought content normal. Cognition and memory are normal.   Physical Exam  Nursing note and vitals reviewed.  Constitutional: She is oriented to person, place, and time. She appears well-developed and well-nourished. No distress.  HENT:  Head: Normocephalic and atraumatic.  Eyes: Conjunctivae are normal. Right eye exhibits no chemosis. Left eye exhibits no chemosis. Right eye exhibits normal extraocular motion. Pupils are equal and reactive Neck: Normal range of motion. Neck supple.  Cardiovascular: Normal rate, regular rhythm and intact distal pulses.   Pulmonary/Chest: Effort normal. No respiratory distress. She has no wheezes.  Abdominal: Soft. She exhibits no distension. There is no tenderness.  Neurological: She is alert and oriented to person, place, and time. She is not disoriented. She displays no tremor. She exhibits normal muscle tone. Coordination normal. GCS eye subscore is 4. GCS verbal subscore is 5. GCS motor subscore is 6.  Left side visual field defect, normal on right, no arm drift, 5/5 strength in extremities, normal finger to nose bilaterally  Skin: Skin is warm and dry. No rash noted. She is not diaphoretic. No pallor.  Psychiatric: She has a normal mood and affect.    Assessment/Plan: Patient with large pituitary tumor and bitemporal hemianopsia.  Admit patient to Neuro ICU in anticipation of surgery tomorrow (transsphenoidal resection of pituitary adenoma).  I have consulted Dr. Constance Holster from ENT to assist with this surgery.  Patient understands risks and benefits and wishes to proceed.   Peggyann Shoals, MD 09/07/2013, 12:20 AM

## 2013-09-07 NOTE — Transfer of Care (Signed)
Immediate Anesthesia Transfer of Care Note  Patient: Diane Fry  Procedure(s) Performed: Procedure(s) with comments: TRANSSPHENOIDAL RESECTION OF PITUITARY MACROADENOMA WITH ABDOMINAL FAT GRAPH (N/A) - CRANIOTOMY HYPOPHYSECTOMY TRANSNASAL APPROACH TRANSNASAL APPROACH (N/A)  Patient Location: PACU  Anesthesia Type:General  Level of Consciousness: awake, alert  and oriented  Airway & Oxygen Therapy: Patient Spontanous Breathing and Patient connected to face mask oxygen  Post-op Assessment: Report given to PACU RN, Post -op Vital signs reviewed and stable and Patient moving all extremities X 4  Post vital signs: Reviewed and stable  Complications: No apparent anesthesia complications

## 2013-09-07 NOTE — Consult Note (Signed)
Reason for Consult: Pituitary adenoma Referring Physician: Erline Levine, MD  Diane Fry is an 80 y.o. female.  HPI: Admitted early this morning with new onset visual field defect. Plan is to have transsphenoidal hypophysectomy later today with Dr. Vertell Limber.  Past Medical History  Diagnosis Date  . Hypertension   . Coronary artery disease   . Diabetes mellitus without complication     History reviewed. No pertinent past surgical history.  History reviewed. No pertinent family history.  Social History:  reports that she has never smoked. She does not have any smokeless tobacco history on file. She reports that she does not drink alcohol. Her drug history is not on file.  Allergies: No Known Allergies  Medications: Reviewed  Results for orders placed during the hospital encounter of 09/06/13 (from the past 48 hour(s))  I-STAT CHEM 8, ED     Status: Abnormal   Collection Time    09/06/13  7:32 PM      Result Value Ref Range   Sodium 134 (*) 137 - 147 mEq/L   Potassium 4.3  3.7 - 5.3 mEq/L   Chloride 93 (*) 96 - 112 mEq/L   BUN 35 (*) 6 - 23 mg/dL   Creatinine, Ser 0.90  0.50 - 1.10 mg/dL   Glucose, Bld 88  70 - 99 mg/dL   Calcium, Ion 1.21  1.13 - 1.30 mmol/L   TCO2 33  0 - 100 mmol/L   Hemoglobin 15.3 (*) 12.0 - 15.0 g/dL   HCT 45.0  36.0 - 46.0 %  CBC WITH DIFFERENTIAL     Status: Abnormal   Collection Time    09/06/13  9:54 PM      Result Value Ref Range   WBC 13.3 (*) 4.0 - 10.5 K/uL   RBC 4.62  3.87 - 5.11 MIL/uL   Hemoglobin 15.2 (*) 12.0 - 15.0 g/dL   HCT 43.6  36.0 - 46.0 %   MCV 94.4  78.0 - 100.0 fL   MCH 32.9  26.0 - 34.0 pg   MCHC 34.9  30.0 - 36.0 g/dL   RDW 13.6  11.5 - 15.5 %   Platelets 216  150 - 400 K/uL   Neutrophils Relative % 67  43 - 77 %   Neutro Abs 8.9 (*) 1.7 - 7.7 K/uL   Lymphocytes Relative 23  12 - 46 %   Lymphs Abs 3.0  0.7 - 4.0 K/uL   Monocytes Relative 10  3 - 12 %   Monocytes Absolute 1.3 (*) 0.1 - 1.0 K/uL   Eosinophils Relative  1  0 - 5 %   Eosinophils Absolute 0.1  0.0 - 0.7 K/uL   Basophils Relative 0  0 - 1 %   Basophils Absolute 0.0  0.0 - 0.1 K/uL  BASIC METABOLIC PANEL     Status: Abnormal   Collection Time    09/06/13  9:54 PM      Result Value Ref Range   Sodium 129 (*) 137 - 147 mEq/L   Potassium 4.3  3.7 - 5.3 mEq/L   Chloride 90 (*) 96 - 112 mEq/L   CO2 28  19 - 32 mEq/L   Glucose, Bld 87  70 - 99 mg/dL   BUN 28 (*) 6 - 23 mg/dL   Creatinine, Ser 0.70  0.50 - 1.10 mg/dL   Calcium 9.4  8.4 - 10.5 mg/dL   GFR calc non Af Amer 78 (*) >90 mL/min   GFR calc Af Amer >  90  >90 mL/min   Comment: (NOTE)     The eGFR has been calculated using the CKD EPI equation.     This calculation has not been validated in all clinical situations.     eGFR's persistently <90 mL/min signify possible Chronic Kidney     Disease.  APTT     Status: None   Collection Time    09/06/13  9:54 PM      Result Value Ref Range   aPTT 24  24 - 37 seconds  PROTIME-INR     Status: None   Collection Time    09/06/13  9:54 PM      Result Value Ref Range   Prothrombin Time 13.4  11.6 - 15.2 seconds   INR 1.04  0.00 - 1.49  MRSA PCR SCREENING     Status: None   Collection Time    09/07/13  2:17 AM      Result Value Ref Range   MRSA by PCR NEGATIVE  NEGATIVE   Comment:            The GeneXpert MRSA Assay (FDA     approved for NASAL specimens     only), is one component of a     comprehensive MRSA colonization     surveillance program. It is not     intended to diagnose MRSA     infection nor to guide or     monitor treatment for     MRSA infections.  GLUCOSE, CAPILLARY     Status: None   Collection Time    09/07/13  4:01 AM      Result Value Ref Range   Glucose-Capillary 97  70 - 99 mg/dL   Comment 1 Documented in Chart     Comment 2 Notify RN      Mr Diane Fry Contrast  09/06/2013   CLINICAL DATA:  Recent cataract surgery 08/29/2013.  Headache.  EXAM: MRI HEAD WITHOUT AND WITH CONTRAST  TECHNIQUE: Multiplanar,  multiecho pulse sequences of the brain and surrounding structures were obtained without and with intravenous contrast.  CONTRAST:  11.38m MULTIHANCE GADOBENATE DIMEGLUMINE 529 MG/ML IV SOLN  COMPARISON:  None.  FINDINGS: Large mass arising from the sella extending into the suprasellar cistern. The sella is enlarged. The mass shows heterogeneous enhancement following contrast infusion. There is compression of the optic chiasm. There is invasion of the right cavernous sinus. The mass measures 1.9 x 1.5 x 3.1 cm. 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 There is a mild amount of hemorrhage in the tumor.  Negative for acute infarct.  There is generalized atrophy. Chronic microvascular ischemic change in the white matter. Ventricle size is appropriate for the level of atrophy. No shift of the midline structures. No other enhancing lesions are seen in the brain following contrast administration.  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 There is a mild amount of hemorrhage in the tumor.  Atrophy and chronic microvascular ischemia in the white matter. No acute infarct.  I discussed the findings with Dr. GDorna Mai  Electronically Signed   By: CFranchot GalloM.D.   On: 09/06/2013 20:42    RXBJ:[id number] as listed in admit H&P  Blood pressure 119/96, pulse 54, temperature 97.9 F (36.6 C), temperature source Oral, resp. rate 17, height _0  (1.575 m), weight 168 lb 6.9 oz (76.4 kg), SpO2 95.00%.  PHYSICAL EXAM: Overall appearance:  Healthy appearing, in no distress Head:  Normocephalic, atraumatic. Ears: External ears normal. Nose: External nose is healthy in appearance. Internal nasal exam free of any lesions or obstruction. Oral Cavity:  There are no mucosal lesions or masses identified.  Neck: No palpable neck masses.  Studies Reviewed: MRI brain  Procedures: none   Assessment/Plan: MRI reviewed. Surgery  discussed with patient. Surgery planned for later today.  Diane Fry 09/07/2013, 8:36 AM

## 2013-09-07 NOTE — Op Note (Signed)
09/06/2013 - 09/07/2013  6:29 PM  PATIENT:  Diane Fry  80 y.o. female  PRE-OPERATIVE DIAGNOSIS:  Pituitary tumor  POST-OPERATIVE DIAGNOSIS:  Pituitary tumor  PROCEDURE:  Procedure(s) with comments: TRANSSPHENOIDAL RESECTION OF PITUITARY MACROADENOMA WITH ABDOMINAL FAT GRAPH (N/A) - CRANIOTOMY HYPOPHYSECTOMY TRANSNASAL APPROACH TRANSNASAL APPROACH (N/A)  SURGEON:  Surgeon(s) and Role: Panel 1:    * Bensen Chadderdon, MD - Primary    * Neelesh Nundkumar, MD - Assisting  Panel 2:    * Jefry Rosen, MD - Primary  PHYSICIAN ASSISTANT:   ASSISTANTS: none   ANESTHESIA:   general  EBL:  Total I/O In: 1875 [I.V.:1875] Out: 1010 [Urine:510; Blood:500]  BLOOD ADMINISTERED:none  DRAINS: none   LOCAL MEDICATIONS USED:  LIDOCAINE   SPECIMEN:  Excision  DISPOSITION OF SPECIMEN:  PATHOLOGY  COUNTS:  YES  TOURNIQUET:  * No tourniquets in log *  DICTATION: Patient is a 80 year old woman with a pituitary macroadenoma with optic chiasmatic compression.  It was elected to take her to surgery for trans-sphenoidal resection of pituitary tumor with abdominal fat graft harvest.  Dr. Rosen performed exposure and will dictate his portion of the surgery separately.  PROCEDURE:  Patient was brought to the operating room and following the smooth and uncomplicated induction of general endotracheal anesthesia, her head was placed on a horseshoe head holder.  Dr. Rosen performed the exposure to the tumor.  The surgery was performed with the aid of the operating microscope.  Upon accessing the floor of the sella, it was clear that the bone was softened and eroded.  The dura was unroofed with a 2 mm Gold-tipped Kerrison rongeur. Hemostasis was assured and the dura was then opened in a cruciate fashion exposing friable, necrotic appearing tumor.  Multiple samples were obtained for pathology.  A variety of angled pituitary curets were then used to remove additional tumor. The arachnoid layer was seen to be  descending into the field and at this point, extent of resection was confirmed with C-arm.  Dr. Nundkumar also confirmed that the tumor appeared to be completely removed.  Surgifoam was used to assure hemostasis.  An abdominal fat graft was harvested and the abdominal incision was closed with interrupted 2-0 and 3-0 vicryl stitches.  A small amount of fat was placed within the sellar defect.  The bony edges were reconstructed with some of the nasal bone retained from the exposure.  A larger piece of fat was used to fill the sphenoid sinus.  Dr. Rosen then closed.    The abdominal wound was dressed with Dermabond.  The patient was extubated and taken to recovery in stable and satisfactory condition, having tolerated the surgery well.  Counts were correct at the end of the case.  PLAN OF CARE: Admit to inpatient   PATIENT DISPOSITION:  PACU - hemodynamically stable.   Delay start of Pharmacological VTE agent (>24hrs) due to surgical blood loss or risk of bleeding: yes  

## 2013-09-07 NOTE — Progress Notes (Signed)
Subjective: Patient reports "I feel ok"  Objective: Vital signs in last 24 hours: Temp:  [98.1 F (36.7 C)-98.6 F (37 C)] 98.3 F (36.8 C) (03/20 0403) Pulse Rate:  [40-64] 49 (03/20 0705) Resp:  [13-29] 17 (03/20 0705) BP: (101-171)/(48-102) 113/48 mmHg (03/20 0705) SpO2:  [92 %-100 %] 98 % (03/20 0705) Weight:  [76.4 kg (168 lb 6.9 oz)-77.565 kg (171 lb)] 76.4 kg (168 lb 6.9 oz) (03/20 0215)  Intake/Output from previous day: 03/19 0701 - 03/20 0700 In: 255 [I.V.:255] Out: -  Intake/Output this shift:    Alert, conversant. Pt speaks with nurse & DrStern, acknowledging and agreeing to plan for surgery this afternoon. She reports no complaints of pain or discomfort.  Lab Results:  Recent Labs  09/06/13 1932 09/06/13 2154  WBC  --  13.3*  HGB 15.3* 15.2*  HCT 45.0 43.6  PLT  --  216   BMET  Recent Labs  09/06/13 1932 09/06/13 2154  NA 134* 129*  K 4.3 4.3  CL 93* 90*  CO2  --  28  GLUCOSE 88 87  BUN 35* 28*  CREATININE 0.90 0.70  CALCIUM  --  9.4    Studies/Results: Mr Kizzie Fantasia Contrast  09/06/2013   CLINICAL DATA:  Recent cataract surgery 08/29/2013.  Headache.  EXAM: MRI HEAD WITHOUT AND WITH CONTRAST  TECHNIQUE: Multiplanar, multiecho pulse sequences of the brain and surrounding structures were obtained without and with intravenous contrast.  CONTRAST:  11.87mL MULTIHANCE GADOBENATE DIMEGLUMINE 529 MG/ML IV SOLN  COMPARISON:  None.  FINDINGS: Large mass arising from the sella extending into the suprasellar cistern. The sella is enlarged. The mass shows heterogeneous enhancement following contrast infusion. There is compression of the optic chiasm. There is invasion of the right cavernous sinus. The mass measures 1.9 x 1.5 x 3.1 cm. 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 There is a mild amount of hemorrhage in the tumor.  Negative for acute infarct.  There is generalized atrophy. Chronic microvascular ischemic change in the white matter.  Ventricle size is appropriate for the level of atrophy. No shift of the midline structures. No other enhancing lesions are seen in the brain following contrast administration.  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 There is a mild amount of hemorrhage in the tumor.  Atrophy and chronic microvascular ischemia in the white matter. No acute infarct.  I discussed the findings with Dr. Dorna Mai   Electronically Signed   By: Franchot Gallo M.D.   On: 09/06/2013 20:42    Assessment/Plan:   LOS: 1 day  For surgery this afternoon, resection of pituitary tumor. Dr. Vertell Limber will meet and speak with daughter and patient when daughter arrives.   Verdis Prime 09/07/2013, 7:59 AM

## 2013-09-08 LAB — GLUCOSE, CAPILLARY
GLUCOSE-CAPILLARY: 122 mg/dL — AB (ref 70–99)
GLUCOSE-CAPILLARY: 145 mg/dL — AB (ref 70–99)
GLUCOSE-CAPILLARY: 212 mg/dL — AB (ref 70–99)
Glucose-Capillary: 147 mg/dL — ABNORMAL HIGH (ref 70–99)
Glucose-Capillary: 130 mg/dL — ABNORMAL HIGH (ref 70–99)
Glucose-Capillary: 121 mg/dL — ABNORMAL HIGH (ref 70–99)

## 2013-09-08 LAB — BASIC METABOLIC PANEL
BUN: 24 mg/dL — AB (ref 6–23)
CALCIUM: 7.9 mg/dL — AB (ref 8.4–10.5)
CO2: 21 meq/L (ref 19–32)
CREATININE: 0.66 mg/dL (ref 0.50–1.10)
Chloride: 96 mEq/L (ref 96–112)
GFR calc Af Amer: >90 mL/min (ref >90)
GFR, EST NON AFRICAN AMERICAN: 79 mL/min — AB (ref >90)
Glucose, Bld: 166 mg/dL — ABNORMAL HIGH (ref 70–99)
Potassium: 4.3 mEq/L (ref 3.7–5.3)
Sodium: 132 mEq/L — ABNORMAL LOW (ref 137–147)

## 2013-09-08 LAB — HEMOGLOBIN A1C
HEMOGLOBIN A1C: 6.5 % — AB (ref <5.7)
MEAN PLASMA GLUCOSE: 140 mg/dL — AB (ref <117)

## 2013-09-08 MED ORDER — PANTOPRAZOLE SODIUM 40 MG PO TBEC
40.0000 mg | DELAYED_RELEASE_TABLET | Freq: Every day | ORAL | Status: DC
  Administered 2013-09-08 – 2013-09-12 (×5): 40 mg via ORAL
  Filled 2013-09-08 (×5): qty 1

## 2013-09-08 NOTE — Progress Notes (Signed)
Patient ID: Diane Fry, female   DOB: 04/01/1929, 80 y.o.   MRN: [id number] Subjective: Patient reports no headache, feels like her vision is better, denies weakness or numbness.  Objective: Vital signs in last 24 hours: Temp:  [97.6 F (36.4 C)-97.8 F (36.6 C)] 97.6 F (36.4 C) (03/21 0348) Pulse Rate:  [52-74] 56 (03/21 0700) Resp:  [11-22] 11 (03/21 0700) BP: (109-166)/(39-91) 109/39 mmHg (03/21 0700) SpO2:  [89 %-100 %] 100 % (03/21 0700) Arterial Line BP: (110-181)/(65-91) 111/65 mmHg (03/21 0000)  Intake/Output from previous day: 03/20 0701 - 03/21 0700 In: 2730 [I.V.:2680; IV Piggyback:50] Out: 2440 [Urine:1110; Blood:500] Intake/Output this shift:    She is awake and alert, still with dense left temporal visual field cut, no obvious CSF drainage, moving all extremities  Lab Results: Lab Results  Component Value Date   WBC 13.3* 09/06/2013   HGB 15.2* 09/06/2013   HCT 43.6 09/06/2013   MCV 94.4 09/06/2013   PLT 216 09/06/2013   Lab Results  Component Value Date   INR 1.04 09/06/2013   BMET Lab Results  Component Value Date   NA 132* 09/08/2013   K 4.3 09/08/2013   CL 96 09/08/2013   CO2 21 09/08/2013   GLUCOSE 166* 09/08/2013   BUN 24* 09/08/2013   CREATININE 0.66 09/08/2013   CALCIUM 7.9* 09/08/2013    Studies/Results: Dg Skull 1-3 Views  09/07/2013   CLINICAL DATA:  Trans-sphenoidal resection of pituitary mass.  EXAM: SKULL - 1-3 VIEW  COMPARISON:  MRI 09/06/2013  FINDINGS: A series of lateral C-arm images show a transsphenoidal approach to the sella. Probe excursion is noted in the suprasellar region and at the inferior sellar region. The last image shows a tiny curvilinear density overlying the sella.  IMPRESSION: Intraoperative films from transsphenoidal surgery.   Electronically Signed   By: Nelson Chimes M.D.   On: 09/07/2013 19:29   Mr Jeri Cos NU Contrast  09/06/2013   CLINICAL DATA:  Recent cataract surgery 08/29/2013.  Headache.  EXAM: MRI HEAD WITHOUT AND  WITH CONTRAST  TECHNIQUE: Multiplanar, multiecho pulse sequences of the brain and surrounding structures were obtained without and with intravenous contrast.  CONTRAST:  11.40mL MULTIHANCE GADOBENATE DIMEGLUMINE 529 MG/ML IV SOLN  COMPARISON:  None.  FINDINGS: Large mass arising from the sella extending into the suprasellar cistern. The sella is enlarged. The mass shows heterogeneous enhancement following contrast infusion. There is compression of the optic chiasm. There is invasion of the right cavernous sinus. The mass measures 1.9 x 1.5 x 3.1 cm. 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 There is a mild amount of hemorrhage in the tumor.  Negative for acute infarct.  There is generalized atrophy. Chronic microvascular ischemic change in the white matter. Ventricle size is appropriate for the level of atrophy. No shift of the midline structures. No other enhancing lesions are seen in the brain following contrast administration.  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 There is a mild amount of hemorrhage in the tumor.  Atrophy and chronic microvascular ischemia in the white matter. No acute infarct.  I discussed the findings with Dr. Dorna Mai   Electronically Signed   By: Franchot Gallo M.D.   On: 09/06/2013 20:42    Assessment/Plan: Seems to be doing well. Continue Solu-Cortef. No change in vision. No headache. Sodium 132.   LOS: 2 days    Medina Degraffenreid S 09/08/2013, 8:03 AM

## 2013-09-09 LAB — GLUCOSE, CAPILLARY
GLUCOSE-CAPILLARY: 101 mg/dL — AB (ref 70–99)
GLUCOSE-CAPILLARY: 102 mg/dL — AB (ref 70–99)
GLUCOSE-CAPILLARY: 121 mg/dL — AB (ref 70–99)
GLUCOSE-CAPILLARY: 140 mg/dL — AB (ref 70–99)
Glucose-Capillary: 115 mg/dL — ABNORMAL HIGH (ref 70–99)
Glucose-Capillary: 97 mg/dL (ref 70–99)

## 2013-09-09 MED ORDER — INSULIN ASPART 100 UNIT/ML ~~LOC~~ SOLN
0.0000 [IU] | Freq: Three times a day (TID) | SUBCUTANEOUS | Status: DC
  Administered 2013-09-09 – 2013-09-12 (×3): 2 [IU] via SUBCUTANEOUS

## 2013-09-09 NOTE — Progress Notes (Signed)
Patient ID: Diane Fry, female   DOB: [birth year], 80 y.o.   MRN: [id number] Subjective: Patient reports she is doing well. She feels like her vision is better than yesterday. No headache.  Objective: Vital signs in last 24 hours: Temp:  [97.6 F (36.4 C)-98.3 F (36.8 C)] 97.6 F (36.4 C) (03/22 0810) Pulse Rate:  [43-77] 43 (03/22 0900) Resp:  [12-20] 20 (03/22 0900) BP: (80-140)/(33-85) 139/77 mmHg (03/22 0900) SpO2:  [91 %-100 %] 93 % (03/22 0900)  Intake/Output from previous day: 03/21 0701 - 03/22 0700 In: 1070 [P.O.:460; I.V.:560; IV Piggyback:50] Out: 405 [Urine:405] Intake/Output this shift:    Neurologic: Grossly normal, out of bed seated in a chair eating breakfast, no obvious leakage from the nose, he is awake and pleasant and conversant and moving all extremities, she seems to have more of a homonymous hemianopsia than a bitemporal defect as best I can tell.  Lab Results: Lab Results  Component Value Date   WBC 13.3* 09/06/2013   HGB 15.2* 09/06/2013   HCT 43.6 09/06/2013   MCV 94.4 09/06/2013   PLT 216 09/06/2013   Lab Results  Component Value Date   INR 1.04 09/06/2013   BMET Lab Results  Component Value Date   NA 132* 09/08/2013   K 4.3 09/08/2013   CL 96 09/08/2013   CO2 21 09/08/2013   GLUCOSE 166* 09/08/2013   BUN 24* 09/08/2013   CREATININE 0.66 09/08/2013   CALCIUM 7.9* 09/08/2013    Studies/Results: Dg Skull 1-3 Views  09/07/2013   CLINICAL DATA:  Trans-sphenoidal resection of pituitary mass.  EXAM: SKULL - 1-3 VIEW  COMPARISON:  MRI 09/06/2013  FINDINGS: A series of lateral C-arm images show a transsphenoidal approach to the sella. Probe excursion is noted in the suprasellar region and at the inferior sellar region. The last image shows a tiny curvilinear density overlying the sella.  IMPRESSION: Intraoperative films from transsphenoidal surgery.   Electronically Signed   By: Nelson Chimes M.D.   On: 09/07/2013 19:29    Assessment/Plan: Seems to be doing  well and progressing nicely. Transfer to the floor today.   LOS: 3 days    Kinte Trim S 09/09/2013, 9:16 AM

## 2013-09-10 LAB — GLUCOSE, CAPILLARY
GLUCOSE-CAPILLARY: 95 mg/dL (ref 70–99)
Glucose-Capillary: 132 mg/dL — ABNORMAL HIGH (ref 70–99)
Glucose-Capillary: 87 mg/dL (ref 70–99)
Glucose-Capillary: 108 mg/dL — ABNORMAL HIGH (ref 70–99)

## 2013-09-10 MED ORDER — HYDROCORTISONE 20 MG PO TABS
20.0000 mg | ORAL_TABLET | Freq: Two times a day (BID) | ORAL | Status: DC
  Administered 2013-09-10 – 2013-09-11 (×3): 20 mg via ORAL
  Filled 2013-09-10 (×5): qty 1

## 2013-09-10 NOTE — Progress Notes (Signed)
UR complete.  Masaye Gatchalian RN, MSN 

## 2013-09-10 NOTE — Evaluation (Signed)
Physical Therapy Evaluation Patient Details Name: Diane Fry MRN: [id number] DOB: 06-20-1929 Today's Date: 09/10/2013 Time: 4854-6270 PT Time Calculation (min): 19 min  PT Assessment / Plan / Recommendation History of Present Illness  pt presents with Pituitary Marcoadenoma Resection.    Clinical Impression  Pt presents generally unsteady and with decreased awareness of safety deficits due to visual deficits on L side.  Pt needs consistent A for balance and attending to L side.  Feel pt would benefit from CIR at D/C to maximize independence prior to returning home with her brother.  Diane continue to follow.      PT Assessment  Patient needs continued PT services    Follow Up Recommendations  CIR    Does the patient have the potential to tolerate intense rehabilitation      Barriers to Discharge Decreased caregiver support      Equipment Recommendations   (TBD)    Recommendations for Other Services Rehab consult   Frequency Min 3X/week    Precautions / Restrictions Precautions Precautions: Fall Precaution Comments: L visual deficits.   Restrictions Weight Bearing Restrictions: No   Pertinent Vitals/Pain Indicates mild headache.        Mobility  Bed Mobility Overal bed mobility: Needs Assistance Bed Mobility: Supine to Sit Supine to sit: Supervision;HOB elevated General bed mobility comments: pt needs HOB elevated and use of bed rails to complete.   Transfers Overall transfer level: Needs assistance Equipment used: None Transfers: Sit to/from Stand Sit to Stand: Min guard General transfer comment: pt mildly unsteady and needs use of UEs.   Ambulation/Gait Ambulation/Gait assistance: Min assist Ambulation Distance (Feet): 140 Feet Assistive device: None Gait Pattern/deviations: Step-through pattern;Decreased stride length;Drifts right/left Gait velocity interpretation: Below normal speed for age/gender General Gait Details: pt drifts to R side of hallway and  seems to avoid turning to L side towards visual deficits.  Max cueing needed for attending to L side as pt amb past her room and did not see objects on L side.      Exercises     PT Diagnosis: Difficulty walking  PT Problem List: Decreased strength;Decreased activity tolerance;Decreased balance;Decreased mobility;Decreased coordination;Decreased knowledge of use of DME;Decreased safety awareness PT Treatment Interventions: DME instruction;Gait training;Stair training;Functional mobility training;Therapeutic activities;Therapeutic exercise;Balance training;Neuromuscular re-education;Patient/family education     PT Goals(Current goals can be found in the care plan section) Acute Rehab PT Goals Patient Stated Goal: Home PT Goal Formulation: With patient Time For Goal Achievement: 09/24/13 Potential to Achieve Goals: Good  Visit Information  Last PT Received On: 09/10/13 Assistance Needed: +1 History of Present Illness: pt presents with Pituitary Marcoadenoma Resection.         Prior [place] expects to be discharged to:: Unsure Living Arrangements: Other relatives (Brother) Available Help at Discharge: Family;Available PRN/intermittently (Only S as brother had CVA.  ) Type of Home: House Home Access: Stairs to enter [employer] of Steps: "few" Home Layout: One level Home Equipment: None Additional Comments: pt's brother has had a CVA and per pt he isn't doing that well.   Prior Function Level of Independence: Independent Comments: Works as Engineer, building services: No difficulties    Cognition  Cognition Arousal/Alertness: Awake/alert Behavior During Therapy: WFL for tasks assessed/performed Overall Cognitive Status: No family/caregiver present to determine baseline cognitive functioning    Extremity/Trunk Assessment Upper Extremity Assessment Upper Extremity Assessment: Defer to OT evaluation Lower Extremity  Assessment Lower Extremity Assessment: Generalized weakness   Balance Balance Overall  balance assessment: Needs assistance Standing balance support: No upper extremity supported Standing balance-Leahy Scale: Fair  End of Session PT - End of Session Equipment Utilized During Treatment: Gait belt Activity Tolerance: Patient tolerated treatment well Patient left: in chair;with call bell/phone within reach Nurse Communication: Mobility status  GP     Catarina Hartshorn, [place] 09/10/2013, 1:12 PM

## 2013-09-10 NOTE — Progress Notes (Signed)
Rehab Admissions Coordinator Note:  Patient was screened by Cleatrice Burke for appropriateness for an Inpatient Acute Rehab Consult per PT recommendation.  At this time, we are recommending Inpatient Rehab consult. Please order if you feel appropriate.  Cleatrice Burke 09/10/2013, 1:28 PM  I can be reached at [phone].

## 2013-09-10 NOTE — Evaluation (Addendum)
Occupational Therapy Evaluation Patient Details Name: Diane Fry MRN: [id number] DOB: [birth year] Today's Date: 09/10/2013 Time: 1497-0263 OT Time Calculation (min): 35 min  OT Assessment / Plan / Recommendation History of present illness pt presents with Pituitary Marcoadenoma Resection.     Clinical Impression   Pt presents with below problem list. Pt independent with ADLs, PTA. Feel pt will benefit from acute OT to increase independence prior to d/c. Pt with apparent left visual field deficit.    OT Assessment  Patient needs continued OT Services    Follow Up Recommendations  CIR    Barriers to Discharge Decreased caregiver support    Equipment Recommendations  Other (comment) (defer to next venue)    Recommendations for Other Services Rehab consult  Frequency  Min 2X/week    Precautions / Restrictions Precautions Precautions: Fall Precaution Comments: L visual deficits.   Restrictions Weight Bearing Restrictions: No   Pertinent Vitals/Pain No pain reported.     ADL  Eating/Feeding: Set up Where Assessed - Eating/Feeding: Chair Grooming: Brushing hair;Teeth care;Denture care;Set up;Supervision/safety (assisted in opening toothpaste) Where Assessed - Grooming: Supported standing; Supported sitting Upper Body Bathing: Set up;Supervision/safety Where Assessed - Upper Body Bathing: Supported sitting Lower Body Bathing: Min guard Where Assessed - Lower Body Bathing: Unsupported sit to stand Upper Body Dressing: Set up;Supervision/safety Where Assessed - Upper Body Dressing: Supported sitting Lower Body Dressing: Min guard Where Assessed - Lower Body Dressing: Unsupported sit to stand Toilet Transfer: Min Psychiatric nurse Method: Sit to Loss adjuster, chartered: Comfort height toilet Toileting - Clothing Manipulation and Hygiene: Moderate assistance Where Assessed - Toileting Clothing Manipulation and Hygiene: Sit to stand from 3-in-1 or  toilet Tub/Shower Transfer Method: Not assessed Equipment Used: Gait belt Transfers/Ambulation Related to ADLs: Min guard  ADL Comments: Cues for pt to look to left side during session. Pt incontinent of bowel-so assisted in cleaning.  Recommended NO driving and told pt to talk to doctor about this. Spoke with pt about recommending further rehab and safety concern.    OT Diagnosis: Disturbance of vision  OT Problem List: Impaired vision/perception;Impaired balance (sitting and/or standing);Decreased cognition;Decreased safety awareness;Decreased knowledge of use of DME or AE;Decreased knowledge of precautions OT Treatment Interventions: Self-care/ADL training;DME and/or AE instruction;Therapeutic activities;Patient/family education;Balance training;Cognitive remediation/compensation;Visual/perceptual remediation/compensation   OT Goals(Current goals can be found in the care plan section) Acute Rehab OT Goals Patient Stated Goal: Home OT Goal Formulation: With patient Time For Goal Achievement: 09/17/13 Potential to Achieve Goals: Good ADL Goals Pt Will Perform Lower Body Dressing: with modified independence;sit to/from stand Pt Will Transfer to Toilet: with modified independence;ambulating;grab bars (comfort height toilet or 3 in 1 over commode) Pt Will Perform Toileting - Clothing Manipulation and hygiene: with modified independence;sit to/from stand Pt Will Perform Tub/Shower Transfer: Tub transfer;Shower transfer;with supervision;ambulating (tub/shower equipment tbd) Additional ADL Goal #1: Pt will independently locate 3/3 grooming items on left side.  Visit Information  Last OT Received On: 09/10/13 Assistance Needed: +1 History of Present Illness: pt presents with Pituitary Marcoadenoma Resection.         Prior [place] expects to be discharged to:: Unsure Living Arrangements: Other relatives (brother) Available Help at Discharge:  Family;Available PRN/intermittently (brother had CVA) Type of Home: House Home Access: Stairs to enter [employer] of Steps: "few" Home Layout: One level Home Equipment: Toilet riser Additional Comments: pt's brother has had a CVA and per pt he isn't doing that well.   Prior  Function Level of Independence: Independent Comments: Works as Engineer, building services: No difficulties         Vision/Perception Vision - History Baseline Vision: Wears glasses all the time Visual History: Cataracts (had recent surgery for cataracts) Vision - Assessment Vision Assessment: Vision tested Visual Fields: Left visual field deficit   Cognition  Cognition Arousal/Alertness: Awake/alert Behavior During Therapy: WFL for tasks assessed/performed Overall Cognitive Status: No family/caregiver present to determine baseline cognitive functioning Comments: When asked to touch each finger (opposition). Pt sat there with finger on her nose despite OT showing her what to do-with time, she was able to complete task.    Extremity/Trunk Assessment Upper Extremity Assessment Upper Extremity Assessment: Overall WFL for tasks assessed Lower Extremity Assessment Lower Extremity Assessment: Defer to PT evaluation     Mobility  Transfers Overall transfer level: Needs assistance Equipment used: None Transfers: Sit to/from Stand Sit to Stand: Min guard General transfer comment: cues for technique.     Exercise        End of Session OT - End of Session Equipment Utilized During Treatment: Gait belt Activity Tolerance: Patient tolerated treatment well Patient left: in chair;with call bell/phone within reach Nurse Communication: Other (comment) (pt in chair; vision deficit)  GO     Benito Mccreedy OTR/L [phone] 09/10/2013, 2:50 PM

## 2013-09-10 NOTE — Progress Notes (Signed)
Subjective: Patient reports feeling better  Objective: Vital signs in last 24 hours: Temp:  [97.3 F (36.3 C)-98 F (36.7 C)] 97.7 F (36.5 C) (03/23 0555) Pulse Rate:  [43-64] 64 (03/23 0555) Resp:  [14-20] 18 (03/23 0555) BP: (108-170)/(44-77) 170/60 mmHg (03/23 0555) SpO2:  [93 %-100 %] 97 % (03/23 0555)  Intake/Output from previous day: 03/22 0701 - 03/23 0700 In: 560 [P.O.:560] Out: 400 [Urine:400] Intake/Output this shift:    Physical Exam: Right visual fields much improved, partially improved on left.  Dressing CDI  Lab Results: No results found for this basename: WBC, HGB, HCT, PLT,  in the last 72 hours BMET  Recent Labs  09/08/13 0530  NA 132*  K 4.3  CL 96  CO2 21  GLUCOSE 166*  BUN 24*  CREATININE 0.66  CALCIUM 7.9*    Studies/Results: No results found.  Assessment/Plan: Doing well.  No DI.  Taper hydrocortisone.  Mobilize with PT.  Anticipate D/C home in am if doing well.    LOS: 4 days    Peggyann Shoals, MD 09/10/2013, 7:32 AM

## 2013-09-11 ENCOUNTER — Encounter (HOSPITAL_COMMUNITY): Payer: Self-pay | Admitting: Neurosurgery

## 2013-09-11 DIAGNOSIS — R5381 Other malaise: Secondary | ICD-10-CM

## 2013-09-11 DIAGNOSIS — C719 Malignant neoplasm of brain, unspecified: Secondary | ICD-10-CM

## 2013-09-11 LAB — BASIC METABOLIC PANEL
BUN: 20 mg/dL (ref 6–23)
CHLORIDE: 90 meq/L — AB (ref 96–112)
CO2: 24 mEq/L (ref 19–32)
CREATININE: 0.58 mg/dL (ref 0.50–1.10)
Calcium: 8.5 mg/dL (ref 8.4–10.5)
GFR calc Af Amer: >90 mL/min (ref >90)
GFR calc non Af Amer: 83 mL/min — ABNORMAL LOW (ref >90)
Glucose, Bld: 97 mg/dL (ref 70–99)
POTASSIUM: 5 meq/L (ref 3.7–5.3)
Sodium: 125 mEq/L — ABNORMAL LOW (ref 137–147)

## 2013-09-11 LAB — GLUCOSE, CAPILLARY
GLUCOSE-CAPILLARY: 94 mg/dL (ref 70–99)
GLUCOSE-CAPILLARY: 107 mg/dL — AB (ref 70–99)
GLUCOSE-CAPILLARY: 128 mg/dL — AB (ref 70–99)
Glucose-Capillary: 95 mg/dL (ref 70–99)

## 2013-09-11 MED ORDER — HYDROCORTISONE 5 MG PO TABS
5.0000 mg | ORAL_TABLET | Freq: Every day | ORAL | Status: DC
  Administered 2013-09-11 – 2013-09-12 (×2): 5 mg via ORAL
  Filled 2013-09-11 (×3): qty 1

## 2013-09-11 MED ORDER — HYDROCORTISONE 10 MG PO TABS
10.0000 mg | ORAL_TABLET | Freq: Two times a day (BID) | ORAL | Status: DC
  Administered 2013-09-11 – 2013-09-13 (×4): 10 mg via ORAL
  Filled 2013-09-11 (×6): qty 1

## 2013-09-11 NOTE — Consult Note (Signed)
Physical Medicine and Rehabilitation Consult Reason for Consult: Pituitary tumor with transsphenoidal resection Referring Physician: Dr. Vertell Limber   HPI: Diane Fry is a 80 y.o. right-handed female with history of hypertension, coronary artery disease diabetes mellitus as well as recent left eye cataract surgery 08/30/2013. Patient independent prior to admission living with her brother. Presented 09/07/2013 with persistent headaches over the right posterior head area as well as left visual field deficit. 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 Patient underwent transsphenoidal resection of pituitary tumor 09/07/2013 per Dr. Vertell Limber. Patient tapered off of hydrocortisone(Cortef). Noted unsteady gait decreased awareness do to left visual field deficits. Physical therapy evaluation completed 09/10/2013 with recommendations of physical medicine rehabilitation consult   Review of Systems  Eyes:       Left visual field cut  Gastrointestinal: Positive for constipation.  Musculoskeletal: Positive for myalgias.  Neurological: Positive for headaches.  Psychiatric/Behavioral:       Anxiety  All other systems reviewed and are negative.   Past Medical History  Diagnosis Date  . Hypertension   . Coronary artery disease   . Diabetes mellitus without complication    History reviewed. No pertinent past surgical history. History reviewed. No pertinent family history. Social History:  reports that she has never smoked. She does not have any smokeless tobacco history on file. She reports that she does not drink alcohol. Her drug history is not on file. Allergies: No Known Allergies Medications Prior to Admission  Medication Sig Dispense Refill  . ALPRAZolam (XANAX) 0.25 MG tablet Take 0.25 mg by mouth 2 (two) times daily.      Marland Kitchen aspirin EC 81 MG tablet Take 81 mg by mouth daily.      Marland Kitchen atorvastatin (LIPITOR) 80 MG tablet Take 40 mg by mouth daily.      .  cephALEXin (KEFLEX) 250 MG capsule Take 250 mg by mouth 4 (four) times daily.      Marland Kitchen HYDROcodone-acetaminophen (NORCO/VICODIN) 5-325 MG per tablet Take 1 tablet by mouth 2 (two) times daily.      Marland Kitchen levothyroxine (SYNTHROID, LEVOTHROID) 50 MCG tablet Take 50 mcg by mouth daily.      . metFORMIN (GLUCOPHAGE) 500 MG tablet Take 500 mg by mouth daily.      . methylPREDNISolone (MEDROL) 4 MG tablet Take 4 mg by mouth 2 (two) times daily.      . metoprolol (LOPRESSOR) 50 MG tablet Take 50 mg by mouth 2 (two) times daily.      Marland Kitchen PROLENSA 0.07 % SOLN Place 1 drop into both eyes daily.      . ramipril (ALTACE) 10 MG capsule Take 10 mg by mouth 2 (two) times daily.        Home: Home Living Family/patient expects to be discharged to:: Unsure Living Arrangements: Other relatives (brother) Available Help at Discharge: Family;Available PRN/intermittently (brother had CVA) Type of Home: House Home Access: Stairs to enter [employer] of Steps: "few" Home Layout: One level Home Equipment: None Additional Comments: pt's brother has had a CVA and per pt he isn't doing that well.    Functional History: Prior Function Level of Independence: Independent Comments: Works as Banker Status:  Mobility: Bed Mobility Overal bed mobility: Needs Assistance Bed Mobility: Supine to Sit Supine to sit: Supervision;HOB elevated General bed mobility comments: pt needs HOB elevated and use of bed rails to complete.   Transfers Overall transfer level: Needs assistance Equipment used: None Transfers:  Sit to/from Stand Sit to Stand: Min guard General transfer comment: cues for technique. Ambulation/Gait Ambulation/Gait assistance: Min assist Ambulation Distance (Feet): 140 Feet Assistive device: None Gait Pattern/deviations: Step-through pattern;Decreased stride length;Drifts right/left Gait velocity interpretation: Below normal speed for age/gender General Gait Details: pt drifts  to R side of hallway and seems to avoid turning to L side towards visual deficits.  Max cueing needed for attending to L side as pt amb past her room and did not see objects on L side.      ADL:    Cognition: Cognition Overall Cognitive Status: No family/caregiver present to determine baseline cognitive functioning Orientation Level: Oriented X4 Cognition Arousal/Alertness: Awake/alert Behavior During Therapy: WFL for tasks assessed/performed Overall Cognitive Status: No family/caregiver present to determine baseline cognitive functioning  Blood pressure 147/70, pulse 64, temperature 98.1 F (36.7 C), temperature source Oral, resp. rate 18, height 5\' 2"  (1.575 m), weight 76.4 kg (168 lb 6.9 oz), SpO2 100.00%. Physical Exam  Vitals reviewed. Constitutional: She is oriented to person, place, and time.  HENT:  Large, pressure dressing over nose  Eyes:  Pupils round and reactive to light  Neck: Normal range of motion. Neck supple. No thyromegaly present.  Cardiovascular: Normal rate and regular rhythm.   Respiratory: Effort normal and breath sounds normal. No respiratory distress.  GI: Soft. Bowel sounds are normal. She exhibits no distension.  Neurological: She is alert and oriented to person, place, and time.  Follows commands, decreased vision in peripheral fields. Slight decrease in fine motor coordination. Motor 4+ UE's prox to distal. LE's 4/5 prox to distal. No sensory defictis.   Skin:  Nasal packing dressing in place  Psychiatric: She has a normal mood and affect.  Pleasant and appropriate    Results for orders placed during the hospital encounter of 09/06/13 (from the past 24 hour(s))  GLUCOSE, CAPILLARY     Status: Abnormal   Collection Time    09/10/13  6:27 AM      Result Value Ref Range   Glucose-Capillary 132 (*) 70 - 99 mg/dL   Comment 1 Documented in Chart     Comment 2 Notify RN    GLUCOSE, CAPILLARY     Status: None   Collection Time    09/10/13 12:10 PM        Result Value Ref Range   Glucose-Capillary 87  70 - 99 mg/dL   Comment 1 Notify RN     Comment 2 Documented in Chart    GLUCOSE, CAPILLARY     Status: None   Collection Time    09/10/13  4:34 PM      Result Value Ref Range   Glucose-Capillary 95  70 - 99 mg/dL   Comment 1 Notify RN     Comment 2 Documented in Chart    GLUCOSE, CAPILLARY     Status: Abnormal   Collection Time    09/10/13  8:52 PM      Result Value Ref Range   Glucose-Capillary 108 (*) 70 - 99 mg/dL   Comment 1 Documented in Chart     Comment 2 Notify RN     No results found.  Assessment/Plan: Diagnosis: pituitary macroadenoma s/p resection 1. Does the need for close, 24 hr/day medical supervision in concert with the patient's rehab needs make it unreasonable for this patient to be served in a less intensive setting? Yes and Potentially 2. Co-Morbidities requiring supervision/potential complications: wound care, pain mgt, dm, htn 3. Due to bladder management,  bowel management, safety, skin/wound care, disease management, medication administration, pain management and patient education, does the patient require 24 hr/day rehab nursing? Yes 4. Does the patient require coordinated care of a physician, rehab nurse, PT (1-2 hrs/day, 5 days/week) and OT (1-2 hrs/day, 5 days/week) to address physical and functional deficits in the context of the above medical diagnosis(es)? Yes Addressing deficits in the following areas: balance, endurance, locomotion, strength, transferring, bowel/bladder control, bathing, dressing, feeding, grooming, toileting and psychosocial support 5. Can the patient actively participate in an intensive therapy program of at least 3 hrs of therapy per day at least 5 days per week? Yes 6. The potential for patient to make measurable gains while on inpatient rehab is excellent 7. Anticipated functional outcomes upon discharge from inpatient rehab are modified independent  with PT, modified independent  with OT, n/a with SLP. 8. Estimated rehab length of stay to reach the above functional goals is: 7 days  9. Does the patient have adequate social supports to accommodate these discharge functional goals? Yes 10. Anticipated D/C setting: Home 11. Anticipated post D/C treatments: [place] therapy 12. Overall Rehab/Functional Prognosis: excellent  RECOMMENDATIONS: This patient's condition is appropriate for continued rehabilitative care in the following setting: CIR vs HH therapies Patient has agreed to participate in recommended program. Potentially Note that insurance prior authorization may be required for reimbursement for recommended care.  Comment: Would like to sed performance with therapy tomorrow. If she does well, she probably can go home as long as brother is capable of supervision  Meredith Staggers, MD, [place]    09/11/2013

## 2013-09-11 NOTE — Progress Notes (Signed)
No DI.  Will make sure patient not developing SIADH.

## 2013-09-11 NOTE — Progress Notes (Signed)
Occupational Therapy Treatment Patient Details Name: Diane Fry MRN: [id number] DOB: 01/09/1929 Today's Date: 09/11/2013    History of present illness pt presents with Pituitary Marcoadenoma Resection.     OT comments  Pt is adamant that she does not want to go to rehab and wants instead to discharge home.  Long discussion with pt re: safey and need for 24 hour supervision/assist which she states her brother can provide.  Pt continues with Lt. Homonymous hemianopsia.  She is aware of the deficit, but requires mod verbal cues to compensate.  She demonstrates disorganized scanning strategy which will impact her with reading, financial management, community activities, etc.  Long discussion with pt re: role of OT is to teach compensation for field loss to lessen the impact on her functionally.  She is aware that she is not to drive at this time, and the recommendation is for her to sponge bathe until cleared by HHOT to sit in tub for tub bath - she is at risk for fall.  Will work with pt next session on phone safety and ability to dial phone independently, if she is unable to do this accurately and efficiently, she may benefit from Life alert.  She is currently able to perform BADLs with supervision to min guard assist.  If her family is indeed able to provide 24 hour supervision, feel she can discharge home with [place] and PT.     Follow Up Recommendations  Home health OT;Supervision/Assistance - 24 hour    Equipment Recommendations  None recommended by OT    Recommendations for Other Services      Precautions / Restrictions Precautions Precautions: Fall Precaution Comments: L visual deficits.         Mobility Bed Mobility               General bed mobility comments: Patient sitting in recliner upon entering room  Transfers Overall transfer level: Needs assistance Equipment used: None   Sit to Stand: Supervision         General transfer comment: cues for technique.     Balance Overall balance assessment: Needs assistance         Standing balance support: No upper extremity supported Standing balance-Leahy Scale: Good Standing balance comment: during ADLs                   ADL   Grooming: Supervision/safety;Standing;Brushing hair         Toilet Transfer: Supervision/safety     Functional mobility during ADLs: Supervision/safety General ADL Comments: Pt reports that she does not want to go to rehab and insists she has 24 hour supervision/assistance at home.  Long discussion with her re: need for assist when she is up, and she verbalizes understanding.  Pt report she tub bathes.  Explained that she currently is not safe to do this and that it is recommended she sponge bathe until she is cleared by [place].  SHe vebalized understanding       Vision                 Additional Comments: Pt performed scanning activity with pen/paper task with organized array of items.  She demonstrates disorganized scan strategy demonstrated by missing ~70% of the items she was asked to mark out - items missed were not isolated to one area.  solid line placed at left margin to anchor the left margin.  Even with that, pt with multiple errors and decreased speed.  A line guide  was used with improved performance, however, when more than 2 lines were visually present, pt noted to move between multiple lines and lose her place.  Line guide then used to isolate items line by line which improved performance significantly.  Pt acknowledged she had been having difficulty with reading, and financial management PTA.  Pt asked about resolution of field deficit asking when it would be better.  Explained to her that we cannot tell her that, that it may improve fully, and it may not improve, or it may improve partially.   Pt became very upset and anxious with this information stating "that's not what I wanted to hear"  Long discussion with her re: role of therapy in teaching  compensation.   Encouragement provided.   Pt required mod verbal cues to effectively scan in hallways to locate items while ambulating.  Talked with she and her "friend" that if she goes outside he should walk on her left and hold onto her left UE.  Both verbalized uderstanding of this.  Also discussed with her the need to have Peripheral field formally tested by ophthalmology and that she is not safe to drive at this time.  She verbalized understanding of both.    Perception     Praxis      Cognition   Behavior During Therapy: [facility] for tasks assessed/performed;Anxious (fluctuated during session) Overall Cognitive Status: No family/caregiver present to determine baseline cognitive functioning                       Extremity/Trunk Assessment               Exercises       General Comments      Pertinent Vitals/ Pain         Home Living                                          Prior Functioning/Environment              Frequency Min 3X/week     Progress Toward Goals  OT Goals(current goals can now be found in the care plan section)  Progress towards OT goals: Progressing toward goals (goals added)  Acute Rehab OT Goals Patient Stated Goal: Home OT Goal Formulation: With patient Time For Goal Achievement: 09/17/13 Potential to Achieve Goals: Good ADL Goals Pt Will Perform Lower Body Dressing: with modified independence;sit to/from stand Pt Will Transfer to Toilet: with modified independence;ambulating;grab bars Pt Will Perform Toileting - Clothing Manipulation and hygiene: with modified independence;sit to/from stand Pt Will Perform Tub/Shower Transfer: Tub transfer;Shower transfer;with supervision;ambulating Additional ADL Goal #1: Pt will independently locate 3/3 grooming items on left side. Additional ADL Goal #2: Pt will be able to use phone to make phone calls modified independently in prep for d/c home.   Plan Discharge plan needs  to be updated    End of Session    Activity Tolerance Patient tolerated treatment well   Patient Left in chair;with call bell/phone within reach;with chair alarm set;with family/visitor present   Nurse Communication Other (comment) (pt anxiety re: vision)        Time: 1191-4782 OT Time Calculation (min): 118 min  Charges: OT General Charges $OT Visit: 1 Procedure OT Treatments $Self Care/Home Management : 53-67 mins $Therapeutic Activity: 53-67 mins  Mariadel Mruk M 09/11/2013, 5:06 PM

## 2013-09-11 NOTE — Progress Notes (Signed)
Subjective: Patient reports "I feel some better, just a little headache around my right eye"  Objective: Vital signs in last 24 hours: Temp:  [97.3 F (36.3 C)-98.1 F (36.7 C)] 97.8 F (36.6 C) (03/24 1025) Pulse Rate:  [59-66] 62 (03/24 1025) Resp:  [18-20] 18 (03/24 1025) BP: (144-179)/(57-73) 144/66 mmHg (03/24 1025) SpO2:  [99 %-100 %] 100 % (03/24 1025)  Intake/Output from previous day: 03/23 0701 - 03/24 0700 In: 840 [P.O.:840] Out: 650 [Urine:650] Intake/Output this shift:    Alert, sitting in chair, reporting only mild h/a. Nasal drsg dry (stained). PEARL. Nearly full visual fields right, left remains limited, but improved. Urine o/p appropriate.  Lab Results: No results found for this basename: WBC, HGB, HCT, PLT,  in the last 72 hours BMET No results found for this basename: NA, K, CL, CO2, GLUCOSE, BUN, CREATININE, CALCIUM,  in the last 72 hours  Studies/Results: No results found.  Assessment/Plan: Progressing   LOS: 5 days  Per DrStern, will check labs once more. CIR if approved. Continue therapies.   Verdis Prime 09/11/2013, 11:07 AM

## 2013-09-11 NOTE — Progress Notes (Signed)
Patient ID: Diane Fry, female   DOB: [birth year], 80 y.o.   MRN: [id number]  Sodium 125. Per Dr.Stern, will repeat in AM. Order entered into Epic.   Verdis Prime, RN, BSN

## 2013-09-11 NOTE — Progress Notes (Signed)
Physical Therapy Treatment Patient Details Name: Diane Fry MRN: [id number] DOB: [birth year] Today's Date: 09/11/2013    History of Present Illness pt presents with Pituitary Marcoadenoma Resection.      PT Comments    Patient very motivated and wants to progress with therapy. Worked on ambulation with L visual deficits with compensatory strategies. Patient lives with brother who can offer supervision. Will continue to recommend CIR for patient to regain some functional independence and be able to return home  Follow Up Recommendations  CIR     Equipment Recommendations       Recommendations for Other Services       Precautions / Restrictions Precautions Precautions: Fall Precaution Comments: L visual deficits.      Mobility  Bed Mobility               General bed mobility comments: Patient sitting in recliner upon entering room  Transfers Overall transfer level: Needs assistance Equipment used: None   Sit to Stand: Min guard         General transfer comment: cues for technique.  Ambulation/Gait Ambulation/Gait assistance: Min assist Ambulation Distance (Feet): 200 Feet Assistive device: Rolling walker (2 wheeled) Gait Pattern/deviations: Step-through pattern;Decreased stride length;Drifts right/left   Gait velocity interpretation: Below normal speed for age/gender General Gait Details: Patient continues to drift right with ambulation but worked on compensatory strategies to increased L visual input. Patient mainly working on head turns.    Stairs            Wheelchair Mobility    Modified Rankin (Stroke Patients Only)       Balance                                    Cognition Arousal/Alertness: Awake/alert Behavior During Therapy: WFL for tasks assessed/performed Overall Cognitive Status: No family/caregiver present to determine baseline cognitive functioning                      Exercises      General  Comments        Pertinent Vitals/Pain Denied pain    Home Living                      Prior Function            PT Goals (current goals can now be found in the care plan section) Progress towards PT goals: Progressing toward goals    Frequency  Min 3X/week    PT Plan      End of Session Equipment Utilized During Treatment: Gait belt Activity Tolerance: Patient tolerated treatment well Patient left: in chair;with call bell/phone within reach     Time: 1028-1109 PT Time Calculation (min): 41 min  Charges:  $Gait Training: 23-37 mins $Therapeutic Activity: 8-22 mins                    G Codes:      Jacqualyn Posey 09/11/2013, 2:42 PM  09/11/2013 Jacqualyn Posey PTA [phone] pager [phone] office

## 2013-09-11 NOTE — Progress Notes (Signed)
Patient awaiting Rehab placement.  Will discuss pack removal with Dr. Constance Holster.

## 2013-09-12 LAB — BASIC METABOLIC PANEL
BUN: 17 mg/dL (ref 6–23)
CO2: 26 mEq/L (ref 19–32)
Calcium: 8.7 mg/dL (ref 8.4–10.5)
Chloride: 87 mEq/L — ABNORMAL LOW (ref 96–112)
Creatinine, Ser: 0.61 mg/dL (ref 0.50–1.10)
GFR calc Af Amer: >90 mL/min (ref >90)
GFR calc non Af Amer: 82 mL/min — ABNORMAL LOW (ref >90)
Glucose, Bld: 108 mg/dL — ABNORMAL HIGH (ref 70–99)
POTASSIUM: 4.7 meq/L (ref 3.7–5.3)
SODIUM: 125 meq/L — AB (ref 137–147)

## 2013-09-12 LAB — GLUCOSE, CAPILLARY
GLUCOSE-CAPILLARY: 130 mg/dL — AB (ref 70–99)
Glucose-Capillary: 93 mg/dL (ref 70–99)
Glucose-Capillary: 110 mg/dL — ABNORMAL HIGH (ref 70–99)
Glucose-Capillary: 112 mg/dL — ABNORMAL HIGH (ref 70–99)

## 2013-09-12 MED ORDER — SALINE SPRAY 0.65 % NA SOLN
2.0000 | Freq: Every day | NASAL | Status: DC
  Administered 2013-09-12 – 2013-09-13 (×7): 2 via NASAL
  Filled 2013-09-12: qty 44

## 2013-09-12 NOTE — Progress Notes (Signed)
Occupational Therapy Treatment Patient Details Name: Diane Fry MRN: [id number] DOB: [birth year] Today's Date: 09/12/2013    History of present illness pt presents with Pituitary Marcoadenoma Resection.     OT comments  Worked on scanning activity during session as well as performed ADLs and locating objects on left side in hallway.   Follow Up Recommendations  Home health OT;Supervision/Assistance - 24 hour    Equipment Recommendations  None recommended by OT    Recommendations for Other Services Rehab consult    Precautions / Restrictions Precautions Precautions: Fall Precaution Comments: L visual deficits.         Mobility Bed Mobility Overal bed mobility: Modified Independent                Transfers Overall transfer level: Needs assistance   Transfers: Sit to/from Stand Sit to Stand: Supervision              Balance                                   ADL   Grooming: Applying deodorant;Wash/dry face;Set up;Supervision/safety;Standing         Toilet Transfer: Ambulation;Min guard;Comfort height toilet;Grab bars Toileting- Clothing Manipulation and Hygiene: Supervision/safety;Sit to/from stand   Functional mobility during ADLs: Min guard;Rolling walker General ADL Comments: pt performed grooming at sink and located all items on left. She did have difficulty figuring out how to open door to get in hallway. Pt ambulated in hallway and OT worked on finding things on left-pt required cues to look to left. OT said no driving and to get a  full visual field assessment done. Had pt dial her phone number on phone and also able to dial 911. Pt moves phone so she can see numbers, but she was able to do so.       Vision                 Additional Comments: Pt performed pen/paper scanning activity during session and had great difficulty with this-OT used paper to try to help with isolating line and also used a star in left column. One of  pt's concern is writing a check, so practiced this and pt did well with task.    Perception     Praxis      Cognition   Behavior During Therapy: WFL for tasks assessed/performed Overall Cognitive Status: No family/caregiver present to determine baseline cognitive functioning                       Extremity/Trunk Assessment               Exercises       General Comments      Pertinent Vitals/ Pain       Headache. Pt appeared comfortable in chair at end of session.  Home Living                                          Prior Functioning/Environment              Frequency Min 3X/week     Progress Toward Goals  OT Goals(current goals can now be found in the care plan section)  Progress towards OT goals: Progressing toward goals  Acute Rehab OT Goals Patient Stated  Goal: not stated OT Goal Formulation: With patient Time For Goal Achievement: 09/17/13 Potential to Achieve Goals: Good ADL Goals Pt Will Perform Lower Body Dressing: with modified independence;sit to/from stand Pt Will Transfer to Toilet: with modified independence;ambulating;grab bars Pt Will Perform Toileting - Clothing Manipulation and hygiene: with modified independence;sit to/from stand Pt Will Perform Tub/Shower Transfer: Tub transfer;Shower transfer;with supervision;ambulating Additional ADL Goal #1: Pt will independently locate 3/3 grooming items on left side. Additional ADL Goal #2: Pt will be able to use phone to make phone calls modified independently in prep for d/c home.   Plan Discharge plan remains appropriate    End of Session Equipment Utilized During Treatment: Gait belt;Rolling walker  Activity Tolerance Patient tolerated treatment well   Patient Left in chair;with call bell/phone within reach;with chair alarm set   Nurse Communication          Time: 6213-0865 OT Time Calculation (min): 37 min  Charges: OT General Charges $OT Visit: 1  Procedure OT Treatments $Self Care/Home Management : 8-22 mins $Therapeutic Activity: 8-22 mins  Benito Mccreedy OTR/L [phone] 09/12/2013, 5:33 PM

## 2013-09-12 NOTE — Progress Notes (Signed)
POD 5  Doing well. Packing and sutures removed. Start saline spray. Will see in office in 3 weeks.

## 2013-09-12 NOTE — Progress Notes (Signed)
OT Cancellation Note  Patient Details Name: Diane Fry MRN: [id number] DOB: [birth year]   Cancelled Treatment:    Reason Eval/Treat Not Completed: Fatigue/lethargy limiting ability to participate. Pt states she feels too tired to work with therapy right now. Pt appeared as if she was about to fall asleep talking to therapist.  Benito Mccreedy OTR/L [phone] 09/12/2013, 2:30 PM

## 2013-09-12 NOTE — Progress Notes (Signed)
Physical Therapy Treatment Patient Details Name: Diane Fry MRN: [id number] DOB: January 10, 1929 Today's Date: 09/12/2013    History of Present Illness pt presents with Pituitary Marcoadenoma Resection.      PT Comments    Patient progressing well. Will need assistance with mobility for safety. Unsure what brother can provide. Per patient he is unable to really assist at all. Per OT note patient would need assist for tub transfers, otherwise planning to spongebathe on her on. Patient worried about not being able to read well after surgery and concerned about DC instructions. I do think it is possible to patient to become Mod I with short CIR stay and regain baseline functioning in short period on intense rehab. Will continue with current POC  Follow Up Recommendations  CIR     Equipment Recommendations       Recommendations for Other Services       Precautions / Restrictions Precautions Precautions: Fall Precaution Comments: L visual deficits.   Restrictions Weight Bearing Restrictions: No    Mobility  Bed Mobility Overal bed mobility: Modified Independent             General bed mobility comments: Patient sitting in recliner upon entering room  Transfers   Equipment used: None   Sit to Stand: Supervision            Ambulation/Gait Ambulation/Gait assistance: Min guard Ambulation Distance (Feet): 400 Feet Assistive device: None       General Gait Details: Patient continues to drift right with ambulation but worked on compensatory strategies to increased L visual input. Patient mainly working on head turns again this session   Stairs Stairs: Yes Stairs assistance: Min guard Stair Management: Step to pattern;Forwards;One rail Right Number of Stairs: 6 General stair comments: cues for safety  Wheelchair Mobility    Modified Rankin (Stroke Patients Only)       Balance                                    Cognition Arousal/Alertness:  Awake/alert Behavior During Therapy: WFL for tasks assessed/performed Overall Cognitive Status: No family/caregiver present to determine baseline cognitive functioning                      Exercises      General Comments        Pertinent Vitals/Pain no apparent distress     Home Living                      Prior Function            PT Goals (current goals can now be found in the care plan section) Progress towards PT goals: Progressing toward goals    Frequency  Min 3X/week    PT Plan Current plan remains appropriate    End of Session Equipment Utilized During Treatment: Gait belt Activity Tolerance: Patient tolerated treatment well Patient left: in bed;with call bell/phone within reach     Time: 0934-0959 PT Time Calculation (min): 25 min  Charges:  $Gait Training: 8-22 mins $Therapeutic Activity: 8-22 mins                    G Codes:      Jacqualyn Posey 09/12/2013, 12:29 PM 09/12/2013 Jacqualyn Posey PTA [phone] pager [phone] office

## 2013-09-12 NOTE — Progress Notes (Signed)
Subjective: Patient reports "I  had just gotten to sleep" "I don't want to go to one of those places like [facility]"  Objective: Vital signs in last 24 hours: Temp:  [97.4 F (36.3 C)-98.3 F (36.8 C)] 98 F (36.7 C) (03/25 0544) Pulse Rate:  [54-86] 86 (03/25 0627) Resp:  [16-20] 18 (03/25 0544) BP: (144-188)/(60-85) 156/60 mmHg (03/25 0627) SpO2:  [97 %-100 %] 98 % (03/25 0544)  Intake/Output from previous day: 03/24 0701 - 03/25 0700 In: 120 [P.O.:120] Out: 750 [Urine:750] Intake/Output this shift:    Awakens to voice, reporting poor sleep due to care visits and intermittent headache. No pain reported at present. PEARL. Left visual fields improving, right full.   Lab Results: No results found for this basename: WBC, HGB, HCT, PLT,  in the last 72 hours BMET  Recent Labs  09/11/13 1150 09/12/13 0421  NA 125* 125*  K 5.0 4.7  CL 90* 87*  CO2 24 26  GLUCOSE 97 108*  BUN 20 17  CREATININE 0.58 0.61  CALCIUM 8.5 8.7    Studies/Results: No results found.  Assessment/Plan: Improving   LOS: 6 days  After discussion of differences in HH, CIR, & SNF, pt seems agreeable to CIR - stating she does not want SNF.  Will defer to Dr. Constance Holster re: pt's question of packing removal.   Diane Fry, Aaron Edelman 09/12/2013, 7:30 AM

## 2013-09-12 NOTE — Progress Notes (Signed)
Rehab admissions - I met with pt and explained the possibility of inpatient rehab. I noted that pt is doing well with PT despite visual limitations (ambulating 400' min guard assist and negotiating 6 steps with min guard assist).  I will need to clarify with rehab MD as to whether pt is too high level and needs intensive acute rehab. I also made note of her [facility] and that authorization would be needed for inpt rehab.  It is likely that pt will be appropriate for home with follow up home health services and supervision from her brother.  I updated Hassan Rowan case Freight forwarder as well. I will update pt/medical team in am after I get clarification from rehab MD.   Please call me with any questions. Thanks.  Nanetta Batty, PT Rehabilitation Admissions Coordinator [phone]    I

## 2013-09-12 NOTE — Progress Notes (Signed)
Significant improvement in visual field deficits in both eyes, still worse on left than right. Packs out per ENT.

## 2013-09-13 LAB — GLUCOSE, CAPILLARY
Glucose-Capillary: 101 mg/dL — ABNORMAL HIGH (ref 70–99)
Glucose-Capillary: 89 mg/dL (ref 70–99)

## 2013-09-13 MED ORDER — HYDROCORTISONE 10 MG PO TABS: 10.0000 mg | ORAL_TABLET | Freq: Two times a day (BID) | ORAL | Status: DC

## 2013-09-13 MED ORDER — HYDROCODONE-ACETAMINOPHEN 5-325 MG PO TABS: 1.0000 | ORAL_TABLET | ORAL | Status: DC | PRN

## 2013-09-13 MED ORDER — HYDROCORTISONE 5 MG PO TABS: 5.0000 mg | ORAL_TABLET | Freq: Every day | ORAL | Status: DC

## 2013-09-13 NOTE — Progress Notes (Signed)
Rehab admissions - Case reviewed with rehab MD and pt is too high level for inpatient rehab. Recommend home with assist from family and follow up home health services. I spoke with pt and wished her well in her recovery.  Please call me with any questions. Thanks.  Nanetta Batty, PT Rehabilitation Admissions Coordinator [phone]

## 2013-09-13 NOTE — Clinical Social Work Note (Signed)
CSW received consult for pt's visual concerns. Please refer to [facility] notes. Per RNCM, pt is refusing home health services. CSW signing off.  Pati Gallo, [place] Social Worker [phone]

## 2013-09-13 NOTE — Progress Notes (Signed)
Subjective: Patient reports "I just wish I could see to read. 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 Reports no headache this morning and expresses relief at having packing out. No drainage. PEARL. No drift. Visual fields improved from pre-op and immediate post-op. Right remains better than left. Pt can read large numbers on wall sign, but not printed letters on hand-held paper.  Lab Results: No results found for this basename: WBC, HGB, HCT, PLT,  in the last 72 hours BMET  Recent Labs  09/11/13 1150 09/12/13 0421  NA 125* 125*  K 5.0 4.7  CL 90* 87*  CO2 24 26  GLUCOSE 97 108*  BUN 20 17  CREATININE 0.58 0.61  CALCIUM 8.5 8.7    Studies/Results: No results found.  Assessment/Plan: Improving   LOS: 7 days  Discussed at length with pt (& daughter via phone) possibility of d/c to home today, as functionally likely too good for CIR admission. Also discussed need for [facility] Social Worker assistance d/t her vision limitations. Daughter expressed concern d/t her living 5hours away; but pt remains adamant that she does not want SNF.  Will request input from LCSW and proceed in the direction of d/c to home if CIR is not in fact a viable option.   Verdis Prime 09/13/2013, 7:55 AM

## 2013-09-13 NOTE — Progress Notes (Signed)
Occupational Therapy Treatment Patient Details Name: Diane Fry MRN: [id number] DOB: 09-11-1928 Today's Date: 09/13/2013    History of present illness pt presents with Pituitary Marcoadenoma Resection.     OT comments  Noted that pt has refused [place] services.  Spoke with pt re: this decision.  She feels she will be fine without it.  Explained at length that she needs direct assist outside her home, and supervision in the home.  She states she will just turn her head.  Explained her risk for fall and that she needs to increase her speed of scanning, and to do this she needs therapy follow up.  She continues to state she feels she will be fine.  Explained risk in the community such as being hit by car if attempting to cross street, walking in a parking lot, etc. Also discussed no driving.  Her "friend" became very irritated with this therapist stating, she doesn't need that, you'd be moving slow to if you just had surgery".  When I attempted to explain pt deficits and need for follow up, he stated "I don't have time with that, leave, you're a jerk".  Apologized to pt and friend.  Pt still refusing follow up.    Pt is extremely high risk for injury.  Follow Up Recommendations  Home health OT;Supervision/Assistance - 24 hour    Equipment Recommendations  None recommended by OT    Recommendations for Other Services      Precautions / Restrictions Precautions Precautions: Fall Precaution Comments: L visual deficits.            Balance                                   ADL            Perception     Praxis          Extremity/Trunk Assessment               Exercises       General Comments      Pertinent Vitals/ Pain         Home Living                                          Prior Functioning/Environment              Frequency Min 3X/week            Kouper Spinella M 09/13/2013, 2:03 PM

## 2013-09-13 NOTE — Progress Notes (Signed)
Talked to patient about discharge planning; patient lives with her brother; CM offered [place] choices, patient refused at this time stated " I don't need that, I need someone to help me see". Patient talked at length about not being able to read letters, see things... CM informed patient that since she lives with her brother he can assist her with reading different items. CM offered [place] choices again, pt refused; CM informed patient that if she changed her mind and wants to have [place] services, her PCP can arrange that for her; Aneta Mins [phone]

## 2013-09-13 NOTE — Progress Notes (Signed)
Occupational Therapy Treatment Patient Details Name: Diane Fry MRN: [id number] DOB: [birth year] Today's Date: 09/13/2013    History of present illness pt presents with Pituitary Marcoadenoma Resection.     OT comments  Pt continues with dense Lt HH per confrontation testing.  She functions at supervision with verbal cues in static, familiar environments, but requires direct supervision/assist for more dynamic environments such as the community.   She reports she feels her vision is improved; however, when tested she is indicating seeing items moving in Lt fields when there in fact is nothing there.  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   Pt does not recall information provided to her during previous sessions re: her visual deficits - unsure if this is anxiety or a true memory deficit - difficult to determine.  She will need 24 hour supervision at discharge.  NO DRIVING, NO working.  She needs HHOT to address the visual deficits and work with her on efficient scan strategies and compensations.     Follow Up Recommendations  Home health OT;Supervision/Assistance - 24 hour    Equipment Recommendations  None recommended by OT    Recommendations for Other Services      Precautions / Restrictions Precautions Precautions: Fall Precaution Comments: L visual deficits.         Mobility Bed Mobility Overal bed mobility: Modified Independent                Transfers Overall transfer level: Needs assistance   Transfers: Sit to/from Stand;Stand Pivot Transfers Sit to Stand: Supervision Stand pivot transfers: Supervision            Balance                                   ADL   Grooming: Brushing hair;Supervision/safety         Toilet Transfer: Supervision/safety;Ambulation     Functional mobility during ADLs: Supervision/safety General ADL  Comments: Spoke at lenght with pt re: visual deficits and need for 24 hour supervision      Vision                 Additional Comments: Pt states she feels her vision is improved; however, per confronatation testing, pt continues with dense Lt HH.  Pt often indicates that she sees the stimulus on the Lt side and when asked to point and look in that location, she realizes nothing is there.  Upon further questioning, pt reports that she is seeing objects in Lt visual field such as  long flowing cloth with patterns; she saw children on her left when ambulating in hallway; she saw her hair far to the left of her head when combing her hair.  Question if she is experiencing Sherran Needs phenomenon.   Discussed this with pt instructing her that she needs to always double check what she thinks she sees in her Lt visual field as it may  not be accurate, nor indicative that something is or isn't present in that field.  Pt is able to locate items in her room with minimal cues and supervision.  While ambulating in hallway, she tends to fixate on Lt in attempts to compensate for Lt deficit, but has significant difficulty efficiently scanning Lt <> Rt.    Perception     Praxis  Cognition   Behavior During Therapy: WFL for tasks assessed/performed Overall Cognitive Status: No family/caregiver present to determine baseline cognitive functioning Area of Impairment: Memory     Memory: Decreased recall of precautions          General Comments: Pt unable to recall info that was provided during the previous two sessions re: her visual deficits - unsure if this could be due to anxiety or if true memory deficit    Extremity/Trunk Assessment               Exercises       General Comments      Pertinent Vitals/ Pain         Home Living                                          Prior Functioning/Environment              Frequency Min 3X/week     Progress  Toward Goals  OT Goals(current goals can now be found in the care plan section)  Progress towards OT goals: Progressing toward goals  ADL Goals Pt Will Perform Lower Body Dressing: with modified independence;sit to/from stand Pt Will Transfer to Toilet: with modified independence;ambulating;grab bars Pt Will Perform Toileting - Clothing Manipulation and hygiene: with modified independence;sit to/from stand Pt Will Perform Tub/Shower Transfer: Tub transfer;Shower transfer;with supervision;ambulating Additional ADL Goal #1: Pt will independently locate 3/3 grooming items on left side. Additional ADL Goal #2: Pt will be able to use phone to make phone calls modified independently in prep for d/c home.   Plan Discharge plan remains appropriate    End of Session    Activity Tolerance Patient tolerated treatment well   Patient Left in chair;with call bell/phone within reach;with chair alarm set   Nurse Communication Mobility status        Time: 0737-1062 OT Time Calculation (min): 47 min  Charges: OT General Charges $OT Visit: 1 Procedure OT Treatments $Therapeutic Activity: 38-52 mins  Jceon Alverio M 09/13/2013, 1:54 PM

## 2013-09-13 NOTE — Discharge Summary (Signed)
Physician Discharge Summary  Patient ID: Diane Fry MRN: [id number] DOB/AGE: 28-Dec-1928 80 y.o.  Admit date: 09/06/2013 Discharge date: 09/13/2013  Admission Diagnoses: Pituitary tumor   Discharge Diagnoses: Pituitary tumor s/p TRANSSPHENOIDAL RESECTION OF PITUITARY MACROADENOMA WITH ABDOMINAL FAT GRAPH (N/A) - CRANIOTOMY HYPOPHYSECTOMY TRANSNASAL APPROACH TRANSNASAL APPROACH (N/A)  Active Problems:   Pituitary macroadenoma with extrasellar extension   Pituitary macroadenoma   Discharged Condition: good  Hospital Course: Diane Fry visited the ED on 09-06-13 due to vision changes not corrected by cataract surgery 1-2 weeks prior. Consult and admission by Dr. Vertell Limber with plan for resection of pituitary tumor for decompression of the optic nerve. Pt consented and transphenoidal resection was performed on 09-07-13 by Drs. Vertell Limber and Constance Holster. Following uncomplicated surgery, she recovered nicely, transferred to Neuro ICU and then [facility]. Visual fields improved and the hope remains for further improvements.  Pt refused several attempts to coordinate home health therapy and social work, as well as offers of SNF placement.  Consults: ENT  Significant Diagnostic Studies:   Treatments: surgery: TRANSSPHENOIDAL RESECTION OF PITUITARY MACROADENOMA WITH ABDOMINAL FAT GRAPH (N/A) - CRANIOTOMY HYPOPHYSECTOMY TRANSNASAL APPROACH TRANSNASAL APPROACH (N/A)   Discharge Exam: Blood pressure 173/74, pulse 69, temperature 98.2 F (36.8 C), temperature source Axillary, resp. rate 18, height 5\' 2"  (1.575 m), weight 76.4 kg (168 lb 6.9 oz), SpO2 97.00%. Discussed at length with pt (& daughter via phone) possibility of d/c to home today, as functionally likely too good for CIR admission. Also discussed need for [facility] Social Worker assistance d/t her vision limitations. Daughter expressed concern d/t her living 5hours away; but pt remains adamant that she does not want SNF.   Will request input from LCSW and proceed  in the direction of d/c to home if CIR is not in fact a viable option. Pt refused Home Health.    Disposition: 01-Home or Self Care Pt will f/u with Dr. Constance Holster in 3 weeks & dr. Vertell Limber in 3-4 weeks.  Rx's to chart for Hydrocodone & Hydrocortisone.     Medication List         ALPRAZolam 0.25 MG tablet  Commonly known as:  XANAX  Take 0.25 mg by mouth 2 (two) times daily.     aspirin EC 81 MG tablet  Take 81 mg by mouth daily.     atorvastatin 80 MG tablet  Commonly known as:  LIPITOR  Take 40 mg by mouth daily.     cephALEXin 250 MG capsule  Commonly known as:  KEFLEX  Take 250 mg by mouth 4 (four) times daily.     HYDROcodone-acetaminophen 5-325 MG per tablet  Commonly known as:  NORCO/VICODIN  Take 1 tablet by mouth 2 (two) times daily.     HYDROcodone-acetaminophen 5-325 MG per tablet  Commonly known as:  NORCO/VICODIN  Take 1-2 tablets by mouth every 4 (four) hours as needed for moderate pain.     hydrocortisone 10 MG tablet  Commonly known as:  CORTEF  Take 1 tablet (10 mg total) by mouth 2 (two) times daily with a meal.     hydrocortisone 5 MG tablet  Commonly known as:  CORTEF  Take 1 tablet (5 mg total) by mouth at bedtime.     levothyroxine 50 MCG tablet  Commonly known as:  SYNTHROID, LEVOTHROID  Take 50 mcg by mouth daily.     metFORMIN 500 MG tablet  Commonly known as:  GLUCOPHAGE  Take 500 mg by mouth daily.     methylPREDNISolone 4  MG tablet  Commonly known as:  MEDROL  Take 4 mg by mouth 2 (two) times daily.     metoprolol 50 MG tablet  Commonly known as:  LOPRESSOR  Take 50 mg by mouth 2 (two) times daily.     PROLENSA 0.07 % Soln  Generic drug:  Bromfenac Sodium  Place 1 drop into both eyes daily.     ramipril 10 MG capsule  Commonly known as:  ALTACE  Take 10 mg by mouth 2 (two) times daily.           Follow-up Information   Follow up with Izora Gala, MD. Schedule an appointment as soon as possible for a visit in 3 weeks.    Specialty:  Otolaryngology   Contact information:   [address] [place] [address] [phone]       Signed: Verdis Prime 09/13/2013, 5:02 PM

## 2014-04-22 ENCOUNTER — Telehealth: Payer: Self-pay | Admitting: Pulmonary Disease

## 2014-04-22 NOTE — Telephone Encounter (Signed)
Error.  Wrong patient.  Nothing further needed.  Satira Anis

## 2014-05-10 ENCOUNTER — Emergency Department (HOSPITAL_COMMUNITY)
Admission: EM | Admit: 2014-05-10 | Discharge: 2014-05-10 | Disposition: A | Discharge status: Other Acute Inpatient Hospital | Payer: Medicare Other | Source: Home / Self Care

## 2014-05-10 ENCOUNTER — Encounter (HOSPITAL_COMMUNITY): Payer: Self-pay | Admitting: *Deleted

## 2014-05-10 ENCOUNTER — Ambulatory Visit (HOSPITAL_COMMUNITY): Payer: Medicare Other

## 2014-05-10 ENCOUNTER — Inpatient Hospital Stay (HOSPITAL_COMMUNITY)
Admission: EM | Admit: 2014-05-10 | Discharge: 2014-05-15 | DRG: 193 | Disposition: A | Discharge status: Home / Self Care | Payer: Medicare Other | Attending: Internal Medicine | Admitting: Internal Medicine

## 2014-05-10 DIAGNOSIS — J189 Pneumonia, unspecified organism: Secondary | ICD-10-CM

## 2014-05-10 DIAGNOSIS — Z7982 Long term (current) use of aspirin: Secondary | ICD-10-CM | POA: Diagnosis not present

## 2014-05-10 DIAGNOSIS — Z7952 Long term (current) use of systemic steroids: Secondary | ICD-10-CM | POA: Diagnosis not present

## 2014-05-10 DIAGNOSIS — D352 Benign neoplasm of pituitary gland: Secondary | ICD-10-CM | POA: Diagnosis present

## 2014-05-10 DIAGNOSIS — E119 Type 2 diabetes mellitus without complications: Secondary | ICD-10-CM | POA: Diagnosis present

## 2014-05-10 DIAGNOSIS — I272 Other secondary pulmonary hypertension: Secondary | ICD-10-CM | POA: Diagnosis present

## 2014-05-10 DIAGNOSIS — I2510 Atherosclerotic heart disease of native coronary artery without angina pectoris: Secondary | ICD-10-CM | POA: Diagnosis present

## 2014-05-10 DIAGNOSIS — R059 Cough, unspecified: Secondary | ICD-10-CM

## 2014-05-10 DIAGNOSIS — I959 Hypotension, unspecified: Secondary | ICD-10-CM | POA: Diagnosis present

## 2014-05-10 DIAGNOSIS — R05 Cough: Secondary | ICD-10-CM

## 2014-05-10 DIAGNOSIS — J154 Pneumonia due to other streptococci: Secondary | ICD-10-CM | POA: Diagnosis present

## 2014-05-10 DIAGNOSIS — R911 Solitary pulmonary nodule: Secondary | ICD-10-CM

## 2014-05-10 DIAGNOSIS — I480 Paroxysmal atrial fibrillation: Secondary | ICD-10-CM | POA: Diagnosis present

## 2014-05-10 DIAGNOSIS — J9601 Acute respiratory failure with hypoxia: Secondary | ICD-10-CM | POA: Diagnosis present

## 2014-05-10 DIAGNOSIS — I10 Essential (primary) hypertension: Secondary | ICD-10-CM | POA: Diagnosis present

## 2014-05-10 DIAGNOSIS — N179 Acute kidney failure, unspecified: Secondary | ICD-10-CM | POA: Diagnosis present

## 2014-05-10 DIAGNOSIS — Z955 Presence of coronary angioplasty implant and graft: Secondary | ICD-10-CM | POA: Diagnosis not present

## 2014-05-10 DIAGNOSIS — E8770 Fluid overload, unspecified: Secondary | ICD-10-CM | POA: Diagnosis present

## 2014-05-10 DIAGNOSIS — Z79899 Other long term (current) drug therapy: Secondary | ICD-10-CM

## 2014-05-10 DIAGNOSIS — J159 Unspecified bacterial pneumonia: Secondary | ICD-10-CM | POA: Diagnosis present

## 2014-05-10 LAB — CBC WITH DIFFERENTIAL/PLATELET
Basophils Absolute: 0 10*3/uL (ref 0.0–0.1)
Basophils Relative: 0 % (ref 0–1)
Eosinophils Absolute: 0 10*3/uL (ref 0.0–0.7)
Eosinophils Relative: 0 % (ref 0–5)
HCT: 42.8 % (ref 36.0–46.0)
HEMOGLOBIN: 13.6 g/dL (ref 12.0–15.0)
Lymphocytes Relative: 13 % (ref 12–46)
Lymphs Abs: 1.4 10*3/uL (ref 0.7–4.0)
MCH: 29.5 pg (ref 26.0–34.0)
MCHC: 31.8 g/dL (ref 30.0–36.0)
MCV: 92.8 fL (ref 78.0–100.0)
MONOS PCT: 10 % (ref 3–12)
Monocytes Absolute: 1.1 10*3/uL — ABNORMAL HIGH (ref 0.1–1.0)
NEUTROS ABS: 8.6 10*3/uL — AB (ref 1.7–7.7)
NEUTROS PCT: 77 % (ref 43–77)
Platelets: 202 10*3/uL (ref 150–400)
RBC: 4.61 MIL/uL (ref 3.87–5.11)
RDW: 15.9 % — ABNORMAL HIGH (ref 11.5–15.5)
WBC: 11.1 10*3/uL — ABNORMAL HIGH (ref 4.0–10.5)

## 2014-05-10 LAB — GLUCOSE, CAPILLARY: GLUCOSE-CAPILLARY: 124 mg/dL — AB (ref 70–99)

## 2014-05-10 LAB — BASIC METABOLIC PANEL
Anion gap: 14 (ref 5–15)
BUN: 22 mg/dL (ref 6–23)
CHLORIDE: 98 meq/L (ref 96–112)
CO2: 28 meq/L (ref 19–32)
Calcium: 9.5 mg/dL (ref 8.4–10.5)
Creatinine, Ser: 1.2 mg/dL — ABNORMAL HIGH (ref 0.50–1.10)
GFR calc non Af Amer: 41 mL/min — ABNORMAL LOW (ref >90)
GFR, EST AFRICAN AMERICAN: 47 mL/min — AB (ref >90)
Glucose, Bld: 111 mg/dL — ABNORMAL HIGH (ref 70–99)
Potassium: 4.1 mEq/L (ref 3.7–5.3)
Sodium: 140 mEq/L (ref 137–147)

## 2014-05-10 LAB — STREP PNEUMONIAE URINARY ANTIGEN: STREP PNEUMO URINARY ANTIGEN: POSITIVE — AB

## 2014-05-10 MED ORDER — HYDROCODONE-ACETAMINOPHEN 5-325 MG PO TABS: 1.0000 | ORAL_TABLET | ORAL | Status: DC | PRN

## 2014-05-10 MED ORDER — ASPIRIN EC 81 MG PO TBEC
81.0000 mg | DELAYED_RELEASE_TABLET | Freq: Every day | ORAL | Status: DC
  Administered 2014-05-11 – 2014-05-15 (×5): 81 mg via ORAL
  Filled 2014-05-10 (×6): qty 1

## 2014-05-10 MED ORDER — HYDROCORTISONE 5 MG PO TABS
5.0000 mg | ORAL_TABLET | Freq: Every day | ORAL | Status: DC
  Administered 2014-05-10 – 2014-05-14 (×5): 5 mg via ORAL
  Filled 2014-05-10 (×6): qty 1

## 2014-05-10 MED ORDER — ONDANSETRON HCL 4 MG/2ML IJ SOLN: 4.0000 mg | Freq: Four times a day (QID) | INTRAMUSCULAR | Status: DC | PRN

## 2014-05-10 MED ORDER — SODIUM CHLORIDE 0.9 % IV BOLUS (SEPSIS)
1000.0000 mL | Freq: Once | INTRAVENOUS | Status: AC
  Administered 2014-05-10: 1000 mL via INTRAVENOUS

## 2014-05-10 MED ORDER — ATORVASTATIN CALCIUM 40 MG PO TABS
40.0000 mg | ORAL_TABLET | Freq: Every day | ORAL | Status: DC
  Administered 2014-05-11 – 2014-05-15 (×5): 40 mg via ORAL
  Filled 2014-05-10 (×6): qty 1

## 2014-05-10 MED ORDER — PNEUMOCOCCAL VAC POLYVALENT 25 MCG/0.5ML IJ INJ
0.5000 mL | INJECTION | INTRAMUSCULAR | Status: AC
  Administered 2014-05-11: 0.5 mL via INTRAMUSCULAR
  Filled 2014-05-10: qty 0.5

## 2014-05-10 MED ORDER — SODIUM CHLORIDE 0.9 % IV SOLN
INTRAVENOUS | Status: DC
  Administered 2014-05-10 – 2014-05-11 (×2): via INTRAVENOUS

## 2014-05-10 MED ORDER — DEXTROSE 5 % IV SOLN
1.0000 g | Freq: Once | INTRAVENOUS | Status: AC
  Administered 2014-05-10: 1 g via INTRAVENOUS
  Filled 2014-05-10: qty 10

## 2014-05-10 MED ORDER — HYDROCORTISONE 10 MG PO TABS
10.0000 mg | ORAL_TABLET | Freq: Two times a day (BID) | ORAL | Status: DC
  Administered 2014-05-11 – 2014-05-15 (×9): 10 mg via ORAL
  Filled 2014-05-10 (×11): qty 1

## 2014-05-10 MED ORDER — KETOROLAC TROMETHAMINE 0.5 % OP SOLN
1.0000 [drp] | Freq: Four times a day (QID) | OPHTHALMIC | Status: DC
  Administered 2014-05-10 – 2014-05-15 (×19): 1 [drp] via OPHTHALMIC
  Filled 2014-05-10: qty 3

## 2014-05-10 MED ORDER — ALPRAZOLAM 0.25 MG PO TABS
0.2500 mg | ORAL_TABLET | Freq: Two times a day (BID) | ORAL | Status: DC
  Administered 2014-05-10 – 2014-05-15 (×10): 0.25 mg via ORAL
  Filled 2014-05-10 (×10): qty 1

## 2014-05-10 MED ORDER — ACETAMINOPHEN 325 MG PO TABS: 650.0000 mg | ORAL_TABLET | Freq: Four times a day (QID) | ORAL | Status: DC | PRN

## 2014-05-10 MED ORDER — DEXTROSE 5 % IV SOLN
500.0000 mg | Freq: Once | INTRAVENOUS | Status: AC
  Administered 2014-05-10: 500 mg via INTRAVENOUS
  Filled 2014-05-10: qty 500

## 2014-05-10 MED ORDER — GUAIFENESIN ER 600 MG PO TB12
600.0000 mg | ORAL_TABLET | Freq: Two times a day (BID) | ORAL | Status: DC
  Administered 2014-05-10 – 2014-05-12 (×5): 600 mg via ORAL
  Filled 2014-05-10 (×7): qty 1

## 2014-05-10 MED ORDER — CEFTRIAXONE SODIUM IN DEXTROSE 20 MG/ML IV SOLN
1.0000 g | INTRAVENOUS | Status: DC
  Administered 2014-05-11 – 2014-05-14 (×4): 1 g via INTRAVENOUS
  Filled 2014-05-10 (×5): qty 50

## 2014-05-10 MED ORDER — LEVOTHYROXINE SODIUM 50 MCG PO TABS
50.0000 ug | ORAL_TABLET | Freq: Every day | ORAL | Status: DC
  Administered 2014-05-11 – 2014-05-15 (×5): 50 ug via ORAL
  Filled 2014-05-10 (×6): qty 1

## 2014-05-10 MED ORDER — GUAIFENESIN-DM 100-10 MG/5ML PO SYRP
5.0000 mL | ORAL_SOLUTION | ORAL | Status: DC | PRN
  Filled 2014-05-10: qty 5

## 2014-05-10 MED ORDER — ENOXAPARIN SODIUM 40 MG/0.4ML ~~LOC~~ SOLN
40.0000 mg | SUBCUTANEOUS | Status: DC
  Administered 2014-05-10 – 2014-05-14 (×5): 40 mg via SUBCUTANEOUS
  Filled 2014-05-10 (×6): qty 0.4

## 2014-05-10 MED ORDER — AZITHROMYCIN 500 MG PO TABS
500.0000 mg | ORAL_TABLET | ORAL | Status: DC
  Administered 2014-05-11: 500 mg via ORAL
  Filled 2014-05-10 (×2): qty 1

## 2014-05-10 MED ORDER — INSULIN ASPART 100 UNIT/ML ~~LOC~~ SOLN
0.0000 [IU] | Freq: Every day | SUBCUTANEOUS | Status: DC
  Administered 2014-05-14: 2 [IU] via SUBCUTANEOUS

## 2014-05-10 MED ORDER — ALBUTEROL SULFATE (2.5 MG/3ML) 0.083% IN NEBU: 2.5000 mg | INHALATION_SOLUTION | RESPIRATORY_TRACT | Status: DC | PRN

## 2014-05-10 MED ORDER — CETYLPYRIDINIUM CHLORIDE 0.05 % MT LIQD
7.0000 mL | Freq: Two times a day (BID) | OROMUCOSAL | Status: DC
  Administered 2014-05-10 – 2014-05-15 (×9): 7 mL via OROMUCOSAL

## 2014-05-10 NOTE — ED Provider Notes (Signed)
CSN: [id number]     Arrival date & time 05/10/14  1449 History   First MD Initiated Contact with Patient 05/10/14 1457     Chief Complaint  Patient presents with  . Shortness of Breath  . Cough     (Consider location/radiation/quality/duration/timing/severity/associated sxs/prior Treatment) Patient is a 80 y.o. female presenting with shortness of breath and cough. The history is provided by the patient and medical records.  Shortness of Breath Severity:  Mild Duration:  3 days Timing:  Intermittent Chronicity:  New Context: not activity, not pollens and not URI   Relieved by:  None tried Worsened by:  Nothing tried Associated symptoms: cough and sputum production   Associated symptoms: no abdominal pain, no chest pain, no fever, no headaches, no rash, no sore throat, no syncope and no vomiting   Cough Associated symptoms: shortness of breath   Associated symptoms: no chest pain, no fever, no headaches, no myalgias, no rash and no sore throat     Past Medical History  Diagnosis Date  . Hypertension   . Coronary artery disease   . Diabetes mellitus without complication    Past Surgical History  Procedure Laterality Date  . Craniotomy N/A 09/07/2013    Procedure: TRANSSPHENOIDAL RESECTION OF PITUITARY MACROADENOMA WITH ABDOMINAL FAT GRAPH;  Surgeon: Erline Levine, MD;  Location: [place];  Service: Neurosurgery;  Laterality: N/A;  CRANIOTOMY HYPOPHYSECTOMY TRANSNASAL APPROACH  . Transnasal approach N/A 09/07/2013    Procedure: TRANSNASAL APPROACH;  Surgeon: Izora Gala, MD;  Location: [facility];  Service: ENT;  Laterality: N/A;   No family history on file. History  Substance Use Topics  . Smoking status: Never Smoker   . Smokeless tobacco: Not on file  . Alcohol Use: No   OB History    No data available     Review of Systems  Constitutional: Negative for fever.  HENT: Negative for congestion and sore throat.   Eyes: Negative for pain and itching.  Respiratory:  Positive for cough, sputum production and shortness of breath.   Cardiovascular: Negative for chest pain, palpitations, leg swelling and syncope.  Gastrointestinal: Negative for vomiting and abdominal pain.  Endocrine: Negative for polydipsia and polyuria.  Genitourinary: Negative for dysuria and frequency.  Musculoskeletal: Negative for myalgias and back pain.  Skin: Negative for rash.  Neurological: Negative for syncope and headaches.      Allergies  Review of patient's allergies indicates no known allergies.  Home Medications   Prior to Admission medications   Medication Sig Start Date End Date Taking? Authorizing Provider  ALPRAZolam (XANAX) 0.25 MG tablet Take 0.25 mg by mouth 2 (two) times daily. 08/23/13   Historical Provider, MD  aspirin EC 81 MG tablet Take 81 mg by mouth daily.    Historical Provider, MD  atorvastatin (LIPITOR) 80 MG tablet Take 40 mg by mouth daily. 08/21/13   Historical Provider, MD  cephALEXin (KEFLEX) 250 MG capsule Take 250 mg by mouth 4 (four) times daily. 09/03/13   Historical Provider, MD  HYDROcodone-acetaminophen (NORCO/VICODIN) 5-325 MG per tablet Take 1 tablet by mouth 2 (two) times daily. 08/23/13   Historical Provider, MD  HYDROcodone-acetaminophen (NORCO/VICODIN) 5-325 MG per tablet Take 1-2 tablets by mouth every 4 (four) hours as needed for moderate pain. 09/13/13   Erline Levine, MD  hydrocortisone (CORTEF) 10 MG tablet Take 1 tablet (10 mg total) by mouth 2 (two) times daily with a meal. 09/13/13   Erline Levine, MD  hydrocortisone (CORTEF) 5 MG tablet  Take 1 tablet (5 mg total) by mouth at bedtime. 09/13/13   Erline Levine, MD  levothyroxine (SYNTHROID, LEVOTHROID) 50 MCG tablet Take 50 mcg by mouth daily. 08/23/13   Historical Provider, MD  metFORMIN (GLUCOPHAGE) 500 MG tablet Take 500 mg by mouth daily. 08/23/13   Historical Provider, MD  methylPREDNISolone (MEDROL) 4 MG tablet Take 4 mg by mouth 2 (two) times daily. 09/03/13   Historical Provider, MD   metoprolol (LOPRESSOR) 50 MG tablet Take 50 mg by mouth 2 (two) times daily. 08/21/13   Historical Provider, MD  PROLENSA 0.07 % SOLN Place 1 drop into both eyes daily. 08/21/13   Historical Provider, MD  ramipril (ALTACE) 10 MG capsule Take 10 mg by mouth 2 (two) times daily. 08/21/13   Historical Provider, MD   BP 127/68 mmHg  Pulse 64  Temp(Src) 98.8 F (37.1 C) (Oral)  Resp 19  Ht 5\' 3"  (1.6 m)  Wt 179 lb (81.194 kg)  BMI 31.72 kg/m2  SpO2 96% Physical Exam  Constitutional: She is oriented to person, place, and time. She appears well-developed and well-nourished.  HENT:  Head: Normocephalic and atraumatic.  Eyes: Conjunctivae and EOM are normal. Right eye exhibits no discharge. Left eye exhibits no discharge.  Cardiovascular: Normal rate and regular rhythm.   Pulmonary/Chest: Effort normal. No respiratory distress. She has wheezes. She has rales.  Abdominal: Soft. She exhibits no distension. There is no tenderness. There is no rebound.  Musculoskeletal: Normal range of motion. She exhibits no edema or tenderness.  Neurological: She is alert and oriented to person, place, and time.  Skin: Skin is warm and dry.  Nursing note and vitals reviewed.   ED Course  Procedures (including critical care time) Labs Review Labs Reviewed  BASIC METABOLIC PANEL  CBC WITH DIFFERENTIAL    Imaging Review Dg Chest 2 View  05/10/2014   CLINICAL DATA:  Cough and shortness of breath.  EXAM: CHEST  2 VIEW  COMPARISON:  None.  FINDINGS: There are multiple poorly defined small nodular densities in both lungs, the largest being 22 x 14 mm just lateral to the left hilum on the PA view. There is also patchy density at the right lung base and in the right middle lobe.  No effusions. No acute osseous abnormality. There is borderline cardiomegaly. Pulmonary vascularity is normal.  IMPRESSION: Multiple patchy areas of nodularity and possible infiltrates. Recommend CT scan of the chest with contrast for further  evaluation of the pulmonary nodules.   Electronically Signed   By: Rozetta Nunnery M.D.   On: 05/10/2014 13:10     EKG Interpretation None      MDM   Final diagnoses:  None    80 yo F w/ ho CAD, DM here with likely pneumonia. PORT score 73, moderate risk 2/2 age as only risk factor. Appears well.   3:53 PM Has 86% O2 on room air, will admit. Bedside ultrasound without obvious effusions, overall ventricular function normal. Likely PNA as inciting agent of cough and sob. Will tx with azithro/roceph for community acquired.  Admitted to medicine.   Merrily Pew, MD 05/11/14 2117  Mariea Clonts, MD 05/12/14 6195  Mariea Clonts, MD 05/24/14 0900

## 2014-05-10 NOTE — ED Provider Notes (Signed)
CSN: [id number]     Arrival date & time 05/10/14  1043 History   None    Chief Complaint  Patient presents with  . Cough   (Consider location/radiation/quality/duration/timing/severity/associated sxs/prior Treatment) HPI She is an 80 year old woman here for evaluation of cough. Her symptoms started about 3 days ago after she was out in the cold air. She reports chest congestion, cough, clear nasal discharge. She reports some shortness of breath with the cough. The cough is productive of thick green sputum. She denies any fevers, chest pain, nausea, vomiting.  No nasal congestion or sore throat. She is eating well.  Past Medical History  Diagnosis Date  . Hypertension   . Coronary artery disease   . Diabetes mellitus without complication    Past Surgical History  Procedure Laterality Date  . Craniotomy N/A 09/07/2013    Procedure: TRANSSPHENOIDAL RESECTION OF PITUITARY MACROADENOMA WITH ABDOMINAL FAT GRAPH;  Surgeon: Erline Levine, MD;  Location: [place];  Service: Neurosurgery;  Laterality: N/A;  CRANIOTOMY HYPOPHYSECTOMY TRANSNASAL APPROACH  . Transnasal approach N/A 09/07/2013    Procedure: TRANSNASAL APPROACH;  Surgeon: Izora Gala, MD;  Location: [facility];  Service: ENT;  Laterality: N/A;   History reviewed. No pertinent family history. History  Substance Use Topics  . Smoking status: Never Smoker   . Smokeless tobacco: Not on file  . Alcohol Use: No   OB History    No data available     Review of Systems  Constitutional: Negative for fever, chills and appetite change.  HENT: Positive for ear pain and rhinorrhea. Negative for congestion and sore throat.   Respiratory: Positive for cough and shortness of breath.   Cardiovascular: Negative for chest pain.  Gastrointestinal: Negative for nausea and vomiting.    Allergies  Review of patient's allergies indicates no known allergies.  Home Medications   Prior to Admission medications   Medication Sig Start Date  End Date Taking? Authorizing Provider  ALPRAZolam (XANAX) 0.25 MG tablet Take 0.25 mg by mouth 2 (two) times daily. 08/23/13   Historical Provider, MD  aspirin EC 81 MG tablet Take 81 mg by mouth daily.    Historical Provider, MD  atorvastatin (LIPITOR) 80 MG tablet Take 40 mg by mouth daily. 08/21/13   Historical Provider, MD  cephALEXin (KEFLEX) 250 MG capsule Take 250 mg by mouth 4 (four) times daily. 09/03/13   Historical Provider, MD  HYDROcodone-acetaminophen (NORCO/VICODIN) 5-325 MG per tablet Take 1 tablet by mouth 2 (two) times daily. 08/23/13   Historical Provider, MD  HYDROcodone-acetaminophen (NORCO/VICODIN) 5-325 MG per tablet Take 1-2 tablets by mouth every 4 (four) hours as needed for moderate pain. 09/13/13   Erline Levine, MD  hydrocortisone (CORTEF) 10 MG tablet Take 1 tablet (10 mg total) by mouth 2 (two) times daily with a meal. 09/13/13   Erline Levine, MD  hydrocortisone (CORTEF) 5 MG tablet Take 1 tablet (5 mg total) by mouth at bedtime. 09/13/13   Erline Levine, MD  levothyroxine (SYNTHROID, LEVOTHROID) 50 MCG tablet Take 50 mcg by mouth daily. 08/23/13   Historical Provider, MD  metFORMIN (GLUCOPHAGE) 500 MG tablet Take 500 mg by mouth daily. 08/23/13   Historical Provider, MD  methylPREDNISolone (MEDROL) 4 MG tablet Take 4 mg by mouth 2 (two) times daily. 09/03/13   Historical Provider, MD  metoprolol (LOPRESSOR) 50 MG tablet Take 50 mg by mouth 2 (two) times daily. 08/21/13   Historical Provider, MD  PROLENSA 0.07 % SOLN Place 1 drop into both  eyes daily. 08/21/13   Historical Provider, MD  ramipril (ALTACE) 10 MG capsule Take 10 mg by mouth 2 (two) times daily. 08/21/13   Historical Provider, MD   BP 106/46 mmHg  Pulse 63  Temp(Src) 98.1 F (36.7 C) (Oral)  Resp 24  SpO2 90% Physical Exam  Constitutional: She is oriented to person, place, and time. She appears well-developed and well-nourished. No distress.  HENT:  Head: Normocephalic and atraumatic.  Right Ear: Tympanic membrane and  external ear normal.  Left Ear: Tympanic membrane and external ear normal.  Nose: No mucosal edema or rhinorrhea.  Mouth/Throat: Oropharynx is clear and moist. No oropharyngeal exudate.  Eyes: Conjunctivae are normal.  Neck: Neck supple.  Cardiovascular: Normal rate, regular rhythm and normal heart sounds.   No murmur heard. Pulmonary/Chest: Effort normal and breath sounds normal. No respiratory distress. She has no wheezes. She has no rales.  Congested sounding cough  Lymphadenopathy:    She has no cervical adenopathy.  Neurological: She is alert and oriented to person, place, and time.    ED Course  Procedures (including critical care time) Labs Review Labs Reviewed - No data to display  Imaging Review Dg Chest 2 View  05/10/2014   CLINICAL DATA:  Cough and shortness of breath.  EXAM: CHEST  2 VIEW  COMPARISON:  None.  FINDINGS: There are multiple poorly defined small nodular densities in both lungs, the largest being 22 x 14 mm just lateral to the left hilum on the PA view. There is also patchy density at the right lung base and in the right middle lobe.  No effusions. No acute osseous abnormality. There is borderline cardiomegaly. Pulmonary vascularity is normal.  IMPRESSION: Multiple patchy areas of nodularity and possible infiltrates. Recommend CT scan of the chest with contrast for further evaluation of the pulmonary nodules.   Electronically Signed   By: Rozetta Nunnery M.D.   On: 05/10/2014 13:10     MDM   1. Community acquired pneumonia   2. Cough   3. Cough   4. Lung nodule    X-ray is concerning for pneumonia. Also has multiple nodules. Given her age and mild hypoxia at 89-90% on room air, will transfer to the ER for additional management and likely admission.    Melony Overly, MD 05/10/14 [phone]

## 2014-05-10 NOTE — ED Notes (Signed)
Placed  On  Nasal  o2  At  2  l /  [place]  On  Cardiac  Monitor

## 2014-05-10 NOTE — ED Notes (Signed)
Symptoms   Of    Cough  / congestion       Shortness  Of  Breath        With  smptoms for    3  Days       Pt  Drove  Herself  To the  Clinic

## 2014-05-10 NOTE — H&P (Addendum)
[facility] History and Physical  Diane Fry OXB:[id number] DOB: 1928-11-16 DOA: 05/10/2014  Referring physician: ED PCP: Myriam Jacobson, MD   Chief Complaint:  Productive cough with congestion for 3 days  HPI:  80 year old female with history of hypertension, coronary artery disease s/p PCI in 2004 , type 2 diabetes mellitus, pituitary macroadenoma status post resection on chronic steroids,  who presented to the urgent care with cough with purulent expectoration for past 3 days. Patient reports going to [employer] 3 days back and was out in the cord for some time and started having chest congestion the next morning associated with cough with yellowish phlegm. She reports that it started to get worse .she denied any fever or chills, runny nose, headache, earache, nausea, vomiting, chest pain, palpitations . She did feel short of breath on exertion. She denied any bowel pain, bowel or urinary symptoms. Denies any change in weight or appetite. Denies any sick contacts or recent travel.  At the urgent care she had a chest x-ray done which showed multiple patchy areas of nodularity with possible infiltrates. She was found to be hypoxic on room air to 89-90% . She was thus sent to the ED.  In the ED blood work showed old recent 11.1, normal hemoglobin and platelets. Chemistry showed creatinine of 1.2 and glucose of 111. Patient given IV Rocephin and azithromycin for community-acquired pneumonia and hospitalists admission requested.  Review of Systems:  Constitutional: Denies fever, chills, diaphoresis, appetite change and fatigue.  HEENT: Congestion, Denies eye pain, ear pain, congestion, sore throat, rhinorrhea, sneezing, mouth sores, trouble swallowing, neck pain,  Respiratory: Dyspnea on exertion, productive cough, chest congestion, Denies  chest tightness,  and wheezing.   Cardiovascular: Denies chest pain, palpitations and leg swelling.  Gastrointestinal: Denies nausea, vomiting,  abdominal pain, diarrhea,  blood in stool   Genitourinary: Denies dysuria,  hematuria, flank pain and difficulty urinating.  Endocrine: Denies: hot or cold intolerance, , polyuria, polydipsia. Musculoskeletal: Denies myalgias, back pain, joint pain Skin: Denies  rash and wound.  Neurological: Denies dizziness, seizures, syncope, weakness, light-headedness, numbness and headaches.  Psychiatric/Behavioral: Denies confusion Past Medical History  Diagnosis Date  . Hypertension   . Coronary artery disease   . Diabetes mellitus without complication    Past Surgical History  Procedure Laterality Date  . Craniotomy N/A 09/07/2013    Procedure: TRANSSPHENOIDAL RESECTION OF PITUITARY MACROADENOMA WITH ABDOMINAL FAT GRAPH;  Surgeon: Erline Levine, MD;  Location: [facility];  Service: Neurosurgery;  Laterality: N/A;  CRANIOTOMY HYPOPHYSECTOMY TRANSNASAL APPROACH  . Transnasal approach N/A 09/07/2013    Procedure: TRANSNASAL APPROACH;  Surgeon: Izora Gala, MD;  Location: [facility];  Service: ENT;  Laterality: N/A;   Social History:  reports that she has never smoked. She does not have any smokeless tobacco history on file. She reports that she does not drink alcohol. Her drug history is not on file.  No Known Allergies  No family history on file.  Prior to Admission medications   Medication Sig Start Date End Date Taking? Authorizing Provider  ALPRAZolam (XANAX) 0.25 MG tablet Take 0.25 mg by mouth 2 (two) times daily. 08/23/13   Historical Provider, MD  aspirin EC 81 MG tablet Take 81 mg by mouth daily.    Historical Provider, MD  atorvastatin (LIPITOR) 80 MG tablet Take 40 mg by mouth daily. 08/21/13   Historical Provider, MD  cephALEXin (KEFLEX) 250 MG capsule Take 250 mg by mouth 4 (four) times daily. 09/03/13  Historical Provider, MD  HYDROcodone-acetaminophen (NORCO/VICODIN) 5-325 MG per tablet Take 1 tablet by mouth 2 (two) times daily. 08/23/13   Historical Provider, MD   HYDROcodone-acetaminophen (NORCO/VICODIN) 5-325 MG per tablet Take 1-2 tablets by mouth every 4 (four) hours as needed for moderate pain. 09/13/13   Erline Levine, MD  hydrocortisone (CORTEF) 10 MG tablet Take 1 tablet (10 mg total) by mouth 2 (two) times daily with a meal. 09/13/13   Erline Levine, MD  hydrocortisone (CORTEF) 5 MG tablet Take 1 tablet (5 mg total) by mouth at bedtime. 09/13/13   Erline Levine, MD  levothyroxine (SYNTHROID, LEVOTHROID) 50 MCG tablet Take 50 mcg by mouth daily. 08/23/13   Historical Provider, MD  metFORMIN (GLUCOPHAGE) 500 MG tablet Take 500 mg by mouth daily. 08/23/13   Historical Provider, MD  methylPREDNISolone (MEDROL) 4 MG tablet Take 4 mg by mouth 2 (two) times daily. 09/03/13   Historical Provider, MD  metoprolol (LOPRESSOR) 50 MG tablet Take 50 mg by mouth 2 (two) times daily. 08/21/13   Historical Provider, MD  PROLENSA 0.07 % SOLN Place 1 drop into both eyes daily. 08/21/13   Historical Provider, MD  ramipril (ALTACE) 10 MG capsule Take 10 mg by mouth 2 (two) times daily. 08/21/13   Historical Provider, MD     Physical Exam:  Filed Vitals:   05/10/14 1458 05/10/14 1515 05/10/14 1530 05/10/14 1538  BP: 127/68  123/47   Pulse: 64 63 63 64  Temp: 98.8 F (37.1 C)     TempSrc: Oral     Resp: 19 22 17 16   Height: 5\' 3"  (1.6 m)     Weight: 81.194 kg (179 lb)     SpO2: 96% 98% 89% 94%    Constitutional: Vital signs reviewed.  Early female lying in bed in no acute distress . Voice sounds congested HEENT: no pallor, no icterus, moist oral mucosa, no cervical lymphadenopathy Cardiovascular: RRR, S1 normal, S2 normal, no MRG Chest: Coarse crackles bilaterally, diffuse scattered rhonchi Abdominal: Soft. Non-tender, non-distended, bowel sounds are normal,  Ext: warm, no edema Neurological: Alert and oriented  Labs on Admission:  Basic Metabolic Panel:  Recent Labs Lab 05/10/14 1522  NA 140  K 4.1  CL 98  CO2 28  GLUCOSE 111*  BUN 22  CREATININE 1.20*   CALCIUM 9.5   Liver Function Tests: No results for input(s): AST, ALT, ALKPHOS, BILITOT, PROT, ALBUMIN in the last 168 hours. No results for input(s): LIPASE, AMYLASE in the last 168 hours. No results for input(s): AMMONIA in the last 168 hours. CBC:  Recent Labs Lab 05/10/14 1522  WBC 11.1*  NEUTROABS 8.6*  HGB 13.6  HCT 42.8  MCV 92.8  PLT 202   Cardiac Enzymes: No results for input(s): CKTOTAL, CKMB, CKMBINDEX, TROPONINI in the last 168 hours. BNP: Invalid input(s): POCBNP CBG: No results for input(s): GLUCAP in the last 168 hours.  Radiological Exams on Admission: Dg Chest 2 View  05/10/2014   CLINICAL DATA:  Cough and shortness of breath.  EXAM: CHEST  2 VIEW  COMPARISON:  None.  FINDINGS: There are multiple poorly defined small nodular densities in both lungs, the largest being 22 x 14 mm just lateral to the left hilum on the PA view. There is also patchy density at the right lung base and in the right middle lobe.  No effusions. No acute osseous abnormality. There is borderline cardiomegaly. Pulmonary vascularity is normal.  IMPRESSION: Multiple patchy areas of nodularity and possible infiltrates. Recommend  CT scan of the chest with contrast for further evaluation of the pulmonary nodules.   Electronically Signed   By: Rozetta Nunnery M.D.   On: 05/10/2014 13:10      Assessment/Plan Active Problems:   Community acquired pneumonia Admit to [place]. Empiric Rocephin and azithromycin. Check blood culture, sputum culture, urine for strep antigen and Legionella. Check HIV antibody. Continue O2 via nasal cannula. Supportive care with IV hydration, Tylenol and antitussives. When necessary antiemetics.   multiple pulmonary nodules Seen on chest x-ray  obtain CT chest with contrast in a.m. if renal function improves   Hypotension Systolic blood pressure 09/32TFTD  in the ED.  Receiving 1 L normal saline in the ED. Will place her on maintenance fluid with normal saline and  monitor. Hold home BP meds   Coronary artery disease Stable. Continue aspirin and statin. Hold metoprolol given low blood pressure   Acute Kidney injury Mild. Monitor with IV fluids. hold  lisinopril metformin   Diabetes mellitus Hold metformin. Check A1C, monitor fsg. continue SSI   History of pituitary macroadenoma status post resection Continue chronic steroid  Diet:cardiac/ diabetic  DVT prophylaxis: sq lovenox   Code Status: Full code Family communication: none at bedside Disposition Plan:admit to MedSurg. Home once improved   Adin Laker, [place] [facility] Pager [phone]  Total time spent on admission :60 minutes  If 7PM-7AM, please contact night-coverage [email or web address] Password [email or web address] 05/10/2014, 4:55 PM

## 2014-05-10 NOTE — ED Notes (Signed)
Patient states productive cough x 3 days, patient states her pcp was out of town so she went to urgent care and was sent her, patient denies any cp, producing thick yellow mucous with coughing

## 2014-05-11 DIAGNOSIS — J154 Pneumonia due to other streptococci: Secondary | ICD-10-CM | POA: Diagnosis not present

## 2014-05-11 LAB — GLUCOSE, CAPILLARY
GLUCOSE-CAPILLARY: 96 mg/dL (ref 70–99)
Glucose-Capillary: 130 mg/dL — ABNORMAL HIGH (ref 70–99)
Glucose-Capillary: 103 mg/dL — ABNORMAL HIGH (ref 70–99)
Glucose-Capillary: 120 mg/dL — ABNORMAL HIGH (ref 70–99)

## 2014-05-11 MED ORDER — METOPROLOL TARTRATE 50 MG PO TABS
50.0000 mg | ORAL_TABLET | Freq: Two times a day (BID) | ORAL | Status: DC
Start: 2014-05-11 — End: 2014-05-15
  Administered 2014-05-11 – 2014-05-15 (×9): 50 mg via ORAL
  Filled 2014-05-11 (×11): qty 1

## 2014-05-11 NOTE — Plan of Care (Signed)
Problem: ICU Phase Progression Outcomes Goal: Pain controlled with appropriate interventions Outcome: Completed/Met Date Met:  05/11/14

## 2014-05-11 NOTE — Progress Notes (Signed)
Patient Demographics  Diane Fry, is a 80 y.o. female, DOB - 1928-08-21, BWI:[id number]  Admit date - 05/10/2014   Admitting Physician Louellen Molder, MD  Outpatient Primary MD for the patient is ROBERTS, Sharol Given, MD  LOS - 1   Chief Complaint  Patient presents with  . Shortness of Breath  . Cough        Subjective:   Diane Fry today has, No headache, No chest pain, No abdominal pain - No Nausea, No new weakness tingling or numbness, Improved Cough - SOB.    Assessment & Plan    1. Community acquired bacterial pneumonia - strep pneumo antigen was given urine. Await final sputum and blood cultures, for now empiric Rocephin and azithromycin to be continued, continue supportive care with neb Shelda Pal treatments and oxygen as needed. She is already feeling better.   2. Lung nodules on chest x-ray. CT scan with contrast to be done once creatinine improves. Likely tomorrow. Will require outpatient pulmonary follow-up.   3.DM2- ISS. Leukocyte held  Lab Results  Component Value Date   HGBA1C 6.5* 09/08/2013    CBG (last 3)   Recent Labs  05/10/14 2116 05/11/14 0753  GLUCAP 124* 96    4. 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 Continue supplementation with chronic steroids.    5. History of CAD. No acute issues stable. On aspirin and statin and beta blocker if blood pressure tolerates.    6. Acute renal failure. Hold lisinopril, gentle hydration done, repeat BMP. Hold Glucophage.     Code Status: Full  Family Communication: none present  Disposition Plan: TBD   Procedures    Consults     Medications  Scheduled Meds: . ALPRAZolam  0.25 mg Oral BID  . antiseptic oral rinse  7 mL Mouth Rinse BID  . aspirin EC  81 mg Oral Daily  .  atorvastatin  40 mg Oral Daily  . azithromycin  500 mg Oral Q24H  . cefTRIAXone (ROCEPHIN)  IV  1 g Intravenous Q24H  . enoxaparin (LOVENOX) injection  40 mg Subcutaneous Q24H  . guaiFENesin  600 mg Oral BID  . hydrocortisone  10 mg Oral BID WC  . hydrocortisone  5 mg Oral QHS  . insulin aspart  0-5 Units Subcutaneous QHS  . ketorolac  1 drop Both Eyes QID  . levothyroxine  50 mcg Oral QAC breakfast   Continuous Infusions:  PRN Meds:.acetaminophen, albuterol, guaiFENesin-dextromethorphan, HYDROcodone-acetaminophen, ondansetron (ZOFRAN) IV  DVT Prophylaxis  Lovenox    Lab Results  Component Value Date   PLT 202 05/10/2014    Antibiotics    Anti-infectives    Start     Dose/Rate Route Frequency Ordered Stop   05/11/14 1600  cefTRIAXone (ROCEPHIN) 1 g in dextrose 5 % 50 mL IVPB - Premix     1 g100 mL/hr over 30 Minutes Intravenous Every 24 hours 05/10/14 1832 05/18/14 1559   05/11/14 1600  azithromycin (ZITHROMAX) tablet 500 mg     500 mg Oral Every 24 hours 05/10/14 1832 05/18/14 1559   05/10/14 1545  cefTRIAXone (ROCEPHIN) 1 g in dextrose 5 % 50 mL IVPB     1 g100 mL/hr over 30 Minutes Intravenous  Once 05/10/14 1537 05/10/14 1629  05/10/14 1545  azithromycin (ZITHROMAX) 500 mg in dextrose 5 % 250 mL IVPB     500 mg250 mL/hr over 60 Minutes Intravenous  Once 05/10/14 1537 05/10/14 1749          Objective:   Filed Vitals:   05/10/14 1900 05/10/14 2025 05/10/14 2120 05/11/14 0517  BP: 152/105 158/76 185/99 133/55  Pulse:    64  Temp:   99 F (37.2 C) 97.8 F (36.6 C)  TempSrc:   Oral Oral  Resp:   15 15  Height:      Weight:      SpO2:  96% 95% 98%    Wt Readings from Last 3 Encounters:  05/10/14 76.794 kg (169 lb 4.8 oz)  09/07/13 76.4 kg (168 lb 6.9 oz)     Intake/Output Summary (Last 24 hours) at 05/11/14 1123 Last data filed at 05/11/14 0913  Gross per 24 hour  Intake   1200 ml  Output    350 ml  Net    850 ml     Physical Exam  Awake Alert,  Oriented X 3, No new F.N deficits, Normal affect [place].AT,PERRAL Supple Neck,No JVD, No cervical lymphadenopathy appriciated.  Symmetrical Chest wall movement, Good air movement bilaterally, CTAB RRR,No Gallops,Rubs or new Murmurs, No Parasternal Heave +ve B.Sounds, Abd Soft, No tenderness, No organomegaly appriciated, No rebound - guarding or rigidity. No Cyanosis, Clubbing or edema, No new Rash or bruise      Data Review   Micro Results No results found for this or any previous visit (from the past 240 hour(s)).  Radiology Reports Dg Chest 2 View  05/10/2014   CLINICAL DATA:  Cough and shortness of breath.  EXAM: CHEST  2 VIEW  COMPARISON:  None.  FINDINGS: There are multiple poorly defined small nodular densities in both lungs, the largest being 22 x 14 mm just lateral to the left hilum on the PA view. There is also patchy density at the right lung base and in the right middle lobe.  No effusions. No acute osseous abnormality. There is borderline cardiomegaly. Pulmonary vascularity is normal.  IMPRESSION: Multiple patchy areas of nodularity and possible infiltrates. Recommend CT scan of the chest with contrast for further evaluation of the pulmonary nodules.   Electronically Signed   By: Rozetta Nunnery M.D.   On: 05/10/2014 13:10     CBC  Recent Labs Lab 05/10/14 1522  WBC 11.1*  HGB 13.6  HCT 42.8  PLT 202  MCV 92.8  MCH 29.5  MCHC 31.8  RDW 15.9*  LYMPHSABS 1.4  MONOABS 1.1*  EOSABS 0.0  BASOSABS 0.0    Chemistries   Recent Labs Lab 05/10/14 1522  NA 140  K 4.1  CL 98  CO2 28  GLUCOSE 111*  BUN 22  CREATININE 1.20*  CALCIUM 9.5   ------------------------------------------------------------------------------------------------------------------ estimated creatinine clearance is 34.9 mL/min (by C-G formula based on Cr of 1.2). ------------------------------------------------------------------------------------------------------------------ No results for input(s):  HGBA1C in the last 72 hours. ------------------------------------------------------------------------------------------------------------------ No results for input(s): CHOL, HDL, LDLCALC, TRIG, CHOLHDL, LDLDIRECT in the last 72 hours. ------------------------------------------------------------------------------------------------------------------ No results for input(s): TSH, T4TOTAL, T3FREE, THYROIDAB in the last 72 hours.  Invalid input(s): FREET3 ------------------------------------------------------------------------------------------------------------------ No results for input(s): VITAMINB12, FOLATE, FERRITIN, TIBC, IRON, RETICCTPCT in the last 72 hours.  Coagulation profile No results for input(s): INR, PROTIME in the last 168 hours.  No results for input(s): DDIMER in the last 72 hours.  Cardiac Enzymes No results for input(s): CKMB, TROPONINI, MYOGLOBIN in  the last 168 hours.  Invalid input(s): CK ------------------------------------------------------------------------------------------------------------------ Invalid input(s): POCBNP     Time Spent in minutes   35   Giang Hemme K M.D on 05/11/2014 at 11:23 AM  Between 7am to 7pm - Pager - [phone]  After 7pm go to [email or web address] - password [email or web address]  And look for the night coverage person covering for me after hours  [facility] Office  [phone]

## 2014-05-11 NOTE — Progress Notes (Signed)
Utilization review completed.  

## 2014-05-11 NOTE — Plan of Care (Signed)
Problem: ICU Phase Progression Outcomes Goal: Voiding-avoid urinary catheter unless indicated Outcome: Completed/Met Date Met:  05/11/14

## 2014-05-12 ENCOUNTER — Encounter (HOSPITAL_COMMUNITY): Payer: Self-pay | Admitting: Radiology

## 2014-05-12 ENCOUNTER — Inpatient Hospital Stay (HOSPITAL_COMMUNITY): Payer: Medicare Other

## 2014-05-12 DIAGNOSIS — I2510 Atherosclerotic heart disease of native coronary artery without angina pectoris: Secondary | ICD-10-CM

## 2014-05-12 LAB — CBC
HEMATOCRIT: 42.8 % (ref 36.0–46.0)
Hemoglobin: 13.6 g/dL (ref 12.0–15.0)
MCH: 29.8 pg (ref 26.0–34.0)
MCHC: 31.8 g/dL (ref 30.0–36.0)
MCV: 93.7 fL (ref 78.0–100.0)
Platelets: 212 10*3/uL (ref 150–400)
RBC: 4.57 MIL/uL (ref 3.87–5.11)
RDW: 15.7 % — ABNORMAL HIGH (ref 11.5–15.5)
WBC: 10 10*3/uL (ref 4.0–10.5)

## 2014-05-12 LAB — BASIC METABOLIC PANEL
Anion gap: 17 — ABNORMAL HIGH (ref 5–15)
BUN: 16 mg/dL (ref 6–23)
CALCIUM: 9.4 mg/dL (ref 8.4–10.5)
CO2: 27 meq/L (ref 19–32)
Chloride: 98 mEq/L (ref 96–112)
Creatinine, Ser: 0.79 mg/dL (ref 0.50–1.10)
GFR calc Af Amer: 87 mL/min — ABNORMAL LOW (ref >90)
GFR, EST NON AFRICAN AMERICAN: 75 mL/min — AB (ref >90)
Glucose, Bld: 153 mg/dL — ABNORMAL HIGH (ref 70–99)
POTASSIUM: 4.2 meq/L (ref 3.7–5.3)
SODIUM: 142 meq/L (ref 137–147)

## 2014-05-12 LAB — GLUCOSE, CAPILLARY
Glucose-Capillary: 122 mg/dL — ABNORMAL HIGH (ref 70–99)
Glucose-Capillary: 134 mg/dL — ABNORMAL HIGH (ref 70–99)
Glucose-Capillary: 121 mg/dL — ABNORMAL HIGH (ref 70–99)
Glucose-Capillary: 104 mg/dL — ABNORMAL HIGH (ref 70–99)

## 2014-05-12 LAB — HIV ANTIBODY (ROUTINE TESTING W REFLEX): HIV 1&2 Ab, 4th Generation: NONREACTIVE

## 2014-05-12 MED ORDER — FUROSEMIDE 10 MG/ML IJ SOLN
20.0000 mg | Freq: Once | INTRAMUSCULAR | Status: AC
  Administered 2014-05-12: 20 mg via INTRAVENOUS
  Filled 2014-05-12: qty 2

## 2014-05-12 MED ORDER — IOHEXOL 300 MG/ML  SOLN
100.0000 mL | Freq: Once | INTRAMUSCULAR | Status: AC | PRN
  Administered 2014-05-12: 100 mL via INTRAVENOUS

## 2014-05-12 NOTE — Plan of Care (Signed)
Problem: ICU Phase Progression Outcomes Goal: Hemodynamically stable Outcome: Progressing     

## 2014-05-12 NOTE — Progress Notes (Addendum)
Patient Demographics  Diane Fry, is a 80 y.o. female, DOB - [birth year], KZL:[id number]  Admit date - 05/10/2014   Admitting Physician Louellen Molder, MD  Outpatient Primary MD for the patient is ROBERTS, Sharol Given, MD  LOS - 2   Chief Complaint  Patient presents with  . Shortness of Breath  . Cough        Subjective:   Diane Fry today has, No headache, No chest pain, No abdominal pain - No Nausea, No new weakness tingling or numbness, Improved Cough - SOB.    Assessment & Plan    1. Community acquired bacterial pneumonia - strep pneumo antigen was given urine. We will leave her on IV Rocephin, question some element of fluid overload will check a echogram and challenge with low-dose Lasix 1. Increase activity try to titrate off oxygen.     2. Lung nodules on chest x-ray. CT scan with contrast , Will require outpatient pulmonary follow-up.     3.DM2- ISS. Glucophage held  Lab Results  Component Value Date   HGBA1C 6.5* 09/08/2013    CBG (last 3)   Recent Labs  05/11/14 1722 05/11/14 2122 05/12/14 0752  GLUCAP 103* 120* 122*     4. 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 Continue supplementation with chronic steroids.     5. History of CAD. No acute issues stable. On aspirin and statin and beta blocker if blood pressure tolerates.     6. Acute renal failure.  Resolved after gentle hydration and holding lisinopril.     Code Status: Full  Family Communication: none present  Disposition Plan: TBD   Procedures    Consults     Medications  Scheduled Meds: . ALPRAZolam  0.25 mg Oral BID  . antiseptic oral rinse  7 mL Mouth Rinse BID  . aspirin EC  81 mg Oral Daily  . atorvastatin  40 mg Oral Daily  . azithromycin  500 mg Oral  Q24H  . cefTRIAXone (ROCEPHIN)  IV  1 g Intravenous Q24H  . enoxaparin (LOVENOX) injection  40 mg Subcutaneous Q24H  . furosemide  20 mg Intravenous Once  . guaiFENesin  600 mg Oral BID  . hydrocortisone  10 mg Oral BID WC  . hydrocortisone  5 mg Oral QHS  . insulin aspart  0-5 Units Subcutaneous QHS  . ketorolac  1 drop Both Eyes QID  . levothyroxine  50 mcg Oral QAC breakfast  . metoprolol tartrate  50 mg Oral BID   Continuous Infusions:  PRN Meds:.acetaminophen, albuterol, guaiFENesin-dextromethorphan, HYDROcodone-acetaminophen, ondansetron (ZOFRAN) IV  DVT Prophylaxis  Lovenox    Lab Results  Component Value Date   PLT 212 05/12/2014    Antibiotics    Anti-infectives    Start     Dose/Rate Route Frequency Ordered Stop   05/11/14 1600  cefTRIAXone (ROCEPHIN) 1 g in dextrose 5 % 50 mL IVPB - Premix     1 g100 mL/hr over 30 Minutes Intravenous Every 24 hours 05/10/14 1832 05/18/14 1559   05/11/14 1600  azithromycin (ZITHROMAX) tablet 500 mg     500 mg Oral Every 24 hours 05/10/14 1832 05/18/14 1559   05/10/14 1545  cefTRIAXone (ROCEPHIN) 1 g in dextrose 5 % 50 mL IVPB  1 g100 mL/hr over 30 Minutes Intravenous  Once 05/10/14 1537 05/10/14 1629   05/10/14 1545  azithromycin (ZITHROMAX) 500 mg in dextrose 5 % 250 mL IVPB     500 mg250 mL/hr over 60 Minutes Intravenous  Once 05/10/14 1537 05/10/14 1749          Objective:   Filed Vitals:   05/11/14 2120 05/11/14 2122 05/11/14 2235 05/12/14 0613  BP: 160/83 160/83 189/80 148/63  Pulse: 73 73 74 66  Temp: 100.1 F (37.8 C)   98.6 F (37 C)  TempSrc: Oral   Oral  Resp: 17 17  20   Height:      Weight:      SpO2: 92% 91%  95%    Wt Readings from Last 3 Encounters:  05/10/14 76.794 kg (169 lb 4.8 oz)  09/07/13 76.4 kg (168 lb 6.9 oz)     Intake/Output Summary (Last 24 hours) at 05/12/14 1030 Last data filed at 05/12/14 0947  Gross per 24 hour  Intake    150 ml  Output      0 ml  Net    150 ml      Physical Exam  Awake Alert, Oriented X 3, No new F.N deficits, Normal affect [place].AT,PERRAL Supple Neck,No JVD, No cervical lymphadenopathy appriciated.  Symmetrical Chest wall movement, Good air movement bilaterally, coarse bilateral breath sounds with few rales RRR,No Gallops,Rubs or new Murmurs, No Parasternal Heave +ve B.Sounds, Abd Soft, No tenderness, No organomegaly appriciated, No rebound - guarding or rigidity. No Cyanosis, Clubbing or edema, No new Rash or bruise      Data Review   Micro Results Recent Results (from the past 240 hour(s))  Culture, blood (routine x 2)     Status: None (Preliminary result)   Collection Time: 05/10/14  3:22 PM  Result Value Ref Range Status   Specimen Description BLOOD LEFT ARM  Final   Special Requests BOTTLES DRAWN AEROBIC AND ANAEROBIC 10CC  Final   Culture  Setup Time   Final    05/10/2014 22:39 Performed at [facility]    Culture   Final           BLOOD CULTURE RECEIVED NO GROWTH TO DATE CULTURE WILL BE HELD FOR 5 DAYS BEFORE ISSUING A FINAL NEGATIVE REPORT Performed at [facility]    Report Status PENDING  Incomplete    Radiology Reports Dg Chest 2 View  05/10/2014   CLINICAL DATA:  Cough and shortness of breath.  EXAM: CHEST  2 VIEW  COMPARISON:  None.  FINDINGS: There are multiple poorly defined small nodular densities in both lungs, the largest being 22 x 14 mm just lateral to the left hilum on the PA view. There is also patchy density at the right lung base and in the right middle lobe.  No effusions. No acute osseous abnormality. There is borderline cardiomegaly. Pulmonary vascularity is normal.  IMPRESSION: Multiple patchy areas of nodularity and possible infiltrates. Recommend CT scan of the chest with contrast for further evaluation of the pulmonary nodules.   Electronically Signed   By: Rozetta Nunnery M.D.   On: 05/10/2014 13:10   Ct Chest W Contrast  05/12/2014   CLINICAL DATA:  Shortness of  breath/pneumonia.  EXAM: CT CHEST WITH CONTRAST  TECHNIQUE: Multidetector CT imaging of the chest was performed during intravenous contrast administration.  CONTRAST:  12mL OMNIPAQUE IOHEXOL 300 MG/ML  SOLN  COMPARISON:  Chest x-ray 05/10/2014  FINDINGS: Lungs are adequately inflated and  demonstrate a patchy bilateral multifocal airspace process with some nodularity likely due to multifocal infection. There is a small right pleural effusion and tiny amount of left pleural fluid. There is evidence of a aspirate material along the dependent portion of the trachea just above the carina which may be the origin of patient's multifocal infectious process which is worse over the right upper lobe.  There is mild cardiomegaly. Left anterior descending coronary artery stent is present. There is mild mediastinal adenopathy likely reactive. No hilar or axillary adenopathy noted. As minimal calcified plaque over the thoracic aorta. Moderate size hiatal hernia is present.  Images through the upper abdomen demonstrate a 1.7 cm gallstone within a contracted gallbladder. Sub cm hypodensity over the upper pole right renal cortex too small to characterize but likely a cyst. There are mild degenerative changes of the spine with mild compression deformity over the upper lumbar spine, age indeterminate but likely chronic.  IMPRESSION: Patchy bilateral multifocal airspace process worse over the right upper lobe with small bilateral pleural effusions. This likely represents a multifocal pneumonia. There is aspirate material within the dependent portion of the distal trachea which may be the origin of this pneumonia. Mild reactive mediastinal adenopathy.  Mild cardiomegaly.  Left anterior descending coronary artery stent.  Cholelithiasis.  Moderate size hiatal hernia.  Sub cm hypodensity over the upper pole right renal cortex too small to characterize but likely a cyst.  Mild compression deformity over the upper lumbar spine  age-indeterminate but likely chronic.   Electronically Signed   By: Marin Olp M.D.   On: 05/12/2014 10:21     CBC  Recent Labs Lab 05/10/14 1522 05/12/14 0535  WBC 11.1* 10.0  HGB 13.6 13.6  HCT 42.8 42.8  PLT 202 212  MCV 92.8 93.7  MCH 29.5 29.8  MCHC 31.8 31.8  RDW 15.9* 15.7*  LYMPHSABS 1.4  --   MONOABS 1.1*  --   EOSABS 0.0  --   BASOSABS 0.0  --     Chemistries   Recent Labs Lab 05/10/14 1522 05/12/14 0535  NA 140 142  K 4.1 4.2  CL 98 98  CO2 28 27  GLUCOSE 111* 153*  BUN 22 16  CREATININE 1.20* 0.79  CALCIUM 9.5 9.4   ------------------------------------------------------------------------------------------------------------------ estimated creatinine clearance is 52.3 mL/min (by C-G formula based on Cr of 0.79). ------------------------------------------------------------------------------------------------------------------ No results for input(s): HGBA1C in the last 72 hours. ------------------------------------------------------------------------------------------------------------------ No results for input(s): CHOL, HDL, LDLCALC, TRIG, CHOLHDL, LDLDIRECT in the last 72 hours. ------------------------------------------------------------------------------------------------------------------ No results for input(s): TSH, T4TOTAL, T3FREE, THYROIDAB in the last 72 hours.  Invalid input(s): FREET3 ------------------------------------------------------------------------------------------------------------------ No results for input(s): VITAMINB12, FOLATE, FERRITIN, TIBC, IRON, RETICCTPCT in the last 72 hours.  Coagulation profile No results for input(s): INR, PROTIME in the last 168 hours.  No results for input(s): DDIMER in the last 72 hours.  Cardiac Enzymes No results for input(s): CKMB, TROPONINI, MYOGLOBIN in the last 168 hours.  Invalid input(s):  CK ------------------------------------------------------------------------------------------------------------------ Invalid input(s): POCBNP     Time Spent in minutes   35   SINGH,PRASHANT K M.D on 05/12/2014 at 10:30 AM  Between 7am to 7pm - Pager - [phone]  After 7pm go to [email or web address] - password [email or web address]  And look for the night coverage person covering for me after hours  [facility] Office  [phone]

## 2014-05-12 NOTE — Progress Notes (Signed)
PT Cancellation Note  Patient Details Name: Diane Fry MRN: [id number] DOB: [birth year]   Cancelled Treatment:    Reason Eval/Treat Not Completed: Patient at procedure or test/unavailable.  Attempted to see patient x2.  On both attempts, patient in testing.  Will return tomorrow for PT evaluation.   Despina Pole 05/12/2014, 3:57 PM Carita Pian. Sanjuana Kava, Hand Pager [phone]

## 2014-05-12 NOTE — Progress Notes (Signed)
  Echocardiogram 2D Echocardiogram has been performed.  Bobbye Charleston 05/12/2014, 4:14 PM

## 2014-05-13 DIAGNOSIS — D352 Benign neoplasm of pituitary gland: Secondary | ICD-10-CM

## 2014-05-13 LAB — GLUCOSE, CAPILLARY
GLUCOSE-CAPILLARY: 98 mg/dL (ref 70–99)
GLUCOSE-CAPILLARY: 133 mg/dL — AB (ref 70–99)
Glucose-Capillary: 151 mg/dL — ABNORMAL HIGH (ref 70–99)
Glucose-Capillary: 158 mg/dL — ABNORMAL HIGH (ref 70–99)

## 2014-05-13 LAB — LEGIONELLA ANTIGEN, URINE

## 2014-05-13 MED ORDER — FUROSEMIDE 10 MG/ML IJ SOLN
20.0000 mg | Freq: Once | INTRAMUSCULAR | Status: AC
  Administered 2014-05-13: 20 mg via INTRAVENOUS
  Filled 2014-05-13: qty 2

## 2014-05-13 MED ORDER — GUAIFENESIN ER 600 MG PO TB12
1200.0000 mg | ORAL_TABLET | Freq: Two times a day (BID) | ORAL | Status: DC
  Administered 2014-05-13 – 2014-05-15 (×5): 1200 mg via ORAL
  Filled 2014-05-13 (×6): qty 2

## 2014-05-13 NOTE — Progress Notes (Signed)
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   Her chest x-ray showed multiple patchy areas of nodularity with possible infiltrates. She was found to be hypoxic on room air to 89-90% . Patient admitted through the ED on 05/10/14 with CAP.  Clinical Impression  Patient presents with problems listed below.  Will benefit from acute PT to maximize functional independence prior to discharge home with daughter.  Recommend HHPT for home safety evaluation and therapy as appropriate.    Follow Up Recommendations Home health PT;Supervision/Assistance - 24 hour (Home safety visit)    Equipment Recommendations  None recommended by PT    Recommendations for Other Services       Precautions / Restrictions Precautions Precautions: None Restrictions Weight Bearing Restrictions: No      Mobility  Bed Mobility               General bed mobility comments: Patient in chair as PT entered room  Transfers Overall transfer level: Needs assistance Equipment used: None Transfers: Sit to/from Stand Sit to Stand: Supervision         General transfer comment: Patient uses safe technique.  Supervision for balance on initial stance.  Ambulation/Gait Ambulation/Gait assistance: Supervision Ambulation Distance (Feet): 32 Feet Assistive device: None Gait Pattern/deviations: Step-through pattern;Decreased stride length Gait velocity: Decreased Gait velocity interpretation: Below normal speed for age/gender General Gait Details: Patient able to ambulate without any physical assistance or assistive device.   Balance fairly good - no loss of balance with gait/turns.  Supervision for safety.  Dyspnea 2/4 with gait.  Stairs            Wheelchair Mobility    Modified Rankin (Stroke Patients Only)       Balance                                             Pertinent Vitals/Pain Pain Assessment: No/denies pain    Home Living Family/patient expects to be discharged to:: Private residence Living Arrangements: Alone Available Help at Discharge: Family;Available 24 hours/day;Available PRN/intermittently (Daughter available 24/7 for 2 days, then prn.) Type of Home: House Home Access: Stairs to enter Entrance Stairs-Rails: Psychiatric nurse of Steps: 4 Home Layout: One level Home Equipment: Bedside commode;Shower seat      Prior Function Level of Independence: Independent         Comments: Works as Engineer, drilling        Extremity/Trunk Assessment   Upper Extremity Assessment: Overall WFL for tasks assessed           Lower Extremity Assessment: Generalized weakness         Communication   Communication: No difficulties  Cognition Arousal/Alertness: Awake/alert Behavior During Therapy: WFL for tasks assessed/performed Overall Cognitive Status: Within Functional Limits for tasks assessed                      General Comments      Exercises  Assessment/Plan    PT Assessment Patient needs continued PT services  PT Diagnosis Difficulty walking;Abnormality of gait;Generalized weakness   PT Problem List Decreased strength;Decreased activity tolerance;Decreased mobility;Cardiopulmonary status limiting activity  PT Treatment Interventions DME instruction;Gait training;Stair training;Functional mobility training;Therapeutic activities;Patient/family education   PT Goals (Current goals can be found in the Care Plan section) Acute Rehab PT Goals Patient Stated Goal: To be able to go home  soon PT Goal Formulation: With patient Time For Goal Achievement: 05/20/14 Potential to Achieve Goals: Good    Frequency Min 3X/week   Barriers to discharge Decreased caregiver support Patient lives alone.  Daughter will be staying with patient for several days at discharge.    Co-evaluation               End of Session Equipment Utilized During Treatment: Gait belt;Oxygen Activity Tolerance: Patient limited by fatigue Patient left: in chair;with call bell/phone within reach;with chair alarm set Nurse Communication: Mobility status         Time: 1440-1457 PT Time Calculation (min) (ACUTE ONLY): 17 min   Charges:   PT Evaluation $Initial PT Evaluation Tier I: 1 Procedure PT Treatments $Gait Training: 8-22 mins   PT G Codes:          Despina Pole 05/13/2014, 5:13 PM Carita Pian. Sanjuana Kava, [place] Pager [phone]

## 2014-05-13 NOTE — Progress Notes (Signed)
   Patient Saturations on Room Air at Rest = 88%  Patient Saturations on 2 Liters of oxygen while Ambulating = 95%

## 2014-05-13 NOTE — Progress Notes (Signed)
Medicare Important Message given? YES  (If response is "NO", the following Medicare IM given date fields will be blank)  Date Medicare IM given:  05/13/2014 Medicare IM given by: Liliana Dang Davy Faught RN CCM Case Mgmt phone [phone] 

## 2014-05-13 NOTE — Progress Notes (Signed)
Patient Demographics  Diane Fry, is a 80 y.o. female, DOB - 16-Jul-1928, HFG:[id number]  Admit date - 05/10/2014   Admitting Physician No admitting provider for patient encounter.  Outpatient Primary MD for the patient is ROBERTS, Sharol Given, MD  LOS - 3   Chief Complaint  Patient presents with  . Shortness of Breath  . Cough        Subjective:   Diane Fry today reports some improvement with breathing.  She is not normally on oxygen at home.  Assessment & Plan    1. Community acquired bacterial pneumonia - strep pneumo antigen was given urine. We will leave her on IV Rocephin.  Supportive care.  Patient remains on oxygen.  Will attempt to wean.   2.  Volume overload.  2D echo was completed and showed normal EF with moderately elevated pulmonary pressures.  Patient has received low dose IV lasix 11/22 and 11/23.     2. Lung nodules on chest x-ray. CT scan with contrast , Will require outpatient pulmonary follow-up.     3.DM2- ISS. Glucophage held.  CBGs stable.  Lab Results  Component Value Date   HGBA1C 6.5* 09/08/2013    CBG (last 3)   Recent Labs  05/12/14 2108 05/13/14 0810 05/13/14 1202  GLUCAP 104* 98 133*     4. 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 Continue supplementation with chronic steroids.     5. History of CAD. No acute issues stable. On aspirin and statin and beta blocker if blood pressure tolerates.     6. Acute renal failure.  Resolved after gentle hydration and holding lisinopril.     Code Status: Full  Family Communication: none present, patient is alert and orientated.  Disposition Plan: TBD   Procedures:  none    Consults:  none   Medications  Scheduled Meds: . ALPRAZolam  0.25 mg Oral BID  .  antiseptic oral rinse  7 mL Mouth Rinse BID  . aspirin EC  81 mg Oral Daily  . atorvastatin  40 mg Oral Daily  . cefTRIAXone (ROCEPHIN)  IV  1 g Intravenous Q24H  . enoxaparin (LOVENOX) injection  40 mg Subcutaneous Q24H  . guaiFENesin  1,200 mg Oral BID  . hydrocortisone  10 mg Oral BID WC  . hydrocortisone  5 mg Oral QHS  . insulin aspart  0-5 Units Subcutaneous QHS  . ketorolac  1 drop Both Eyes QID  . levothyroxine  50 mcg Oral QAC breakfast  . metoprolol tartrate  50 mg Oral BID   Continuous Infusions:  PRN Meds:.acetaminophen, albuterol, guaiFENesin-dextromethorphan, HYDROcodone-acetaminophen, ondansetron (ZOFRAN) IV  DVT Prophylaxis  Lovenox    Lab Results  Component Value Date   PLT 212 05/12/2014    Antibiotics    Anti-infectives    Start     Dose/Rate Route Frequency Ordered Stop   05/11/14 1600  cefTRIAXone (ROCEPHIN) 1 g in dextrose 5 % 50 mL IVPB - Premix     1 g100 mL/hr over 30 Minutes Intravenous Every 24 hours 05/10/14 1832 05/18/14 1559   05/11/14 1600  azithromycin (ZITHROMAX) tablet 500 mg  Status:  Discontinued     500 mg Oral Every 24 hours 05/10/14 1832 05/12/14 1403   05/10/14 1545  cefTRIAXone (ROCEPHIN) 1 g in dextrose 5 % 50 mL IVPB     1 g100 mL/hr over 30 Minutes Intravenous  Once 05/10/14 1537 05/10/14 1629   05/10/14 1545  azithromycin (ZITHROMAX) 500 mg in dextrose 5 % 250 mL IVPB     500 mg250 mL/hr over 60 Minutes Intravenous  Once 05/10/14 1537 05/10/14 1749          Objective:   Filed Vitals:   05/12/14 2244 05/13/14 0603 05/13/14 0635 05/13/14 1037  BP: 152/69 189/86 156/79   Pulse: 66 67 65 72  Temp: 98.6 F (37 C) 98.3 F (36.8 C)    TempSrc: Oral Oral    Resp: 19 20    Height:      Weight:      SpO2: 100% 100%  96%    Wt Readings from Last 3 Encounters:  05/10/14 76.794 kg (169 lb 4.8 oz)  09/07/13 76.4 kg (168 lb 6.9 oz)     Intake/Output Summary (Last 24 hours) at 05/13/14 1345 Last data filed at 05/13/14 1001   Gross per 24 hour  Intake    590 ml  Output      1 ml  Net    589 ml     Physical Exam  Awake Alert, Oriented X 3, LOL, No new F.N deficits, Normal affect [place].AT,PERRAL Supple Neck,No JVD, No cervical lymphadenopathy appreciated.  Symmetrical Chest wall movement, Shallow breaths, no wheeze but poor air movement. RRR,No Gallops,Rubs or new Murmurs, No Parasternal Heave +ve B.Sounds, Abd Soft, No tenderness, No organomegaly appriciated, No rebound - guarding or rigidity. No Cyanosis, Clubbing or edema, No new Rash or bruise      Data Review   Micro Results Recent Results (from the past 240 hour(s))  Culture, blood (routine x 2)     Status: None (Preliminary result)   Collection Time: 05/10/14  3:22 PM  Result Value Ref Range Status   Specimen Description BLOOD LEFT ARM  Final   Special Requests BOTTLES DRAWN AEROBIC AND ANAEROBIC 10CC  Final   Culture  Setup Time   Final    05/10/2014 22:39 Performed at [facility]    Culture   Final           BLOOD CULTURE RECEIVED NO GROWTH TO DATE CULTURE WILL BE HELD FOR 5 DAYS BEFORE ISSUING A FINAL NEGATIVE REPORT Performed at [facility]    Report Status PENDING  Incomplete  Culture, blood (routine x 2)     Status: None (Preliminary result)   Collection Time: 05/10/14  5:41 PM  Result Value Ref Range Status   Specimen Description BLOOD LEFT FOREARM  Final   Special Requests BOTTLES DRAWN AEROBIC ONLY 4 ML  Final   Culture  Setup Time   Final    05/11/2014 01:01 Performed at [facility]    Culture   Final           BLOOD CULTURE RECEIVED NO GROWTH TO DATE CULTURE WILL BE HELD FOR 5 DAYS BEFORE ISSUING A FINAL NEGATIVE REPORT Performed at [facility]    Report Status PENDING  Incomplete    Radiology Reports Dg Chest 2 View  05/10/2014   CLINICAL DATA:  Cough and shortness of breath.  EXAM: CHEST  2 VIEW  COMPARISON:  None.  FINDINGS: There are multiple poorly defined small nodular densities  in both lungs, the largest being 22 x 14 mm just lateral to the left hilum on the PA view.  There is also patchy density at the right lung base and in the right middle lobe.  No effusions. No acute osseous abnormality. There is borderline cardiomegaly. Pulmonary vascularity is normal.  IMPRESSION: Multiple patchy areas of nodularity and possible infiltrates. Recommend CT scan of the chest with contrast for further evaluation of the pulmonary nodules.   Electronically Signed   By: Rozetta Nunnery M.D.   On: 05/10/2014 13:10   Ct Chest W Contrast  05/12/2014   CLINICAL DATA:  Shortness of breath/pneumonia.  EXAM: CT CHEST WITH CONTRAST  TECHNIQUE: Multidetector CT imaging of the chest was performed during intravenous contrast administration.  CONTRAST:  131mL OMNIPAQUE IOHEXOL 300 MG/ML  SOLN  COMPARISON:  Chest x-ray 05/10/2014  FINDINGS: Lungs are adequately inflated and demonstrate a patchy bilateral multifocal airspace process with some nodularity likely due to multifocal infection. There is a small right pleural effusion and tiny amount of left pleural fluid. There is evidence of a aspirate material along the dependent portion of the trachea just above the carina which may be the origin of patient's multifocal infectious process which is worse over the right upper lobe.  There is mild cardiomegaly. Left anterior descending coronary artery stent is present. There is mild mediastinal adenopathy likely reactive. No hilar or axillary adenopathy noted. As minimal calcified plaque over the thoracic aorta. Moderate size hiatal hernia is present.  Images through the upper abdomen demonstrate a 1.7 cm gallstone within a contracted gallbladder. Sub cm hypodensity over the upper pole right renal cortex too small to characterize but likely a cyst. There are mild degenerative changes of the spine with mild compression deformity over the upper lumbar spine, age indeterminate but likely chronic.  IMPRESSION: Patchy bilateral  multifocal airspace process worse over the right upper lobe with small bilateral pleural effusions. This likely represents a multifocal pneumonia. There is aspirate material within the dependent portion of the distal trachea which may be the origin of this pneumonia. Mild reactive mediastinal adenopathy.  Mild cardiomegaly.  Left anterior descending coronary artery stent.  Cholelithiasis.  Moderate size hiatal hernia.  Sub cm hypodensity over the upper pole right renal cortex too small to characterize but likely a cyst.  Mild compression deformity over the upper lumbar spine age-indeterminate but likely chronic.   Electronically Signed   By: Marin Olp M.D.   On: 05/12/2014 10:21     CBC  Recent Labs Lab 05/10/14 1522 05/12/14 0535  WBC 11.1* 10.0  HGB 13.6 13.6  HCT 42.8 42.8  PLT 202 212  MCV 92.8 93.7  MCH 29.5 29.8  MCHC 31.8 31.8  RDW 15.9* 15.7*  LYMPHSABS 1.4  --   MONOABS 1.1*  --   EOSABS 0.0  --   BASOSABS 0.0  --     Chemistries   Recent Labs Lab 05/10/14 1522 05/12/14 0535  NA 140 142  K 4.1 4.2  CL 98 98  CO2 28 27  GLUCOSE 111* 153*  BUN 22 16  CREATININE 1.20* 0.79  CALCIUM 9.5 9.4       Time Spent in minutes   35   York, Takiera Mayo L PA-C on 05/13/2014 at 1:45 PM  Between 7am to 7pm - Pager - [phone]  After 7pm go to [email or web address] - password [email or web address]  And look for the night coverage person covering for me after hours  [facility] Office  [phone]

## 2014-05-13 NOTE — Care Management Note (Addendum)
    Page 1 of 2   05/15/2014     3:01:13 PM CARE MANAGEMENT NOTE 05/15/2014  Patient:  Diane Fry, Diane Fry   Account Number:  [id number]  Date Initiated:  05/13/2014  Documentation initiated by:  [facility]  Subjective/Objective Assessment:   CPNA  admit- lives alone.     Action/Plan:   pt eval-hhpt  patient refusing.   Anticipated DC Date:  05/15/2014   Anticipated DC Plan:  [place]  CM consult      [facility] Choice  HOME HEALTH  DURABLE MEDICAL EQUIPMENT   Choice offered to / List presented to:  C-1 Patient   DME arranged  [facility]YGEN      DME agency  [place] arranged  HH-2 PT  [place] - 11 Patient Refused      Status of service:  Completed, signed off Medicare Important Message given?  YES (If response is "NO", the following Medicare IM given date fields will be blank) Date Medicare IM given:  05/13/2014 Medicare IM given by:  [facility] Date Additional Medicare IM given:   Additional Medicare IM given by:    Discharge Disposition:  HOME/SELF CARE  Per UR Regulation:  Reviewed for med. necessity/level of care/duration of stay  If discussed at [place] of Stay Meetings, dates discussed:    Comments:  05/15/14 [place], BSN [phone] patient is for dc today, she is still refusing hh services. Patient needs home oxygen and she chose [facility], referral made to [facility] for home oxygen.  Jermaine with [facility] states they will get set up.  05/14/14 [place], BSN [phone] NCM offered hh agency choices to patient and she states she does not want hhpt at this time.  NCM will continue to follow for dc needs.  05/13/14 [place], BSN [phone] patient lives alone.  NCM will continue to follow for dc needs.

## 2014-05-13 NOTE — Clinical Documentation Improvement (Signed)
Possible Clinical Conditions?  Acute Respiratory Failure Acute on Chronic Respiratory Failure Chronic Respiratory Failure Other Condition Cannot Clinically Determine   Supporting Information:(As per notes) "At the urgent care she had a chest x-ray done which showed multiple patchy areas of nodularity with possible infiltrates. She was found to be hypoxic on room air to 89-90% . She was thus sent to the ED."  Signs & Symptoms:(As per notes)Chest: Coarse crackles bilaterally, diffuse scattered rhonchi  Diagnostics:CXR 05/12/14 IMPRESSION: Patchy bilateral multifocal airspace process worse over the right upper lobe with small bilateral pleural effusions. This likely represents a multifocal pneumonia. There is aspirate material within the dependent portion of the distal trachea which may be the origin of this pneumonia. Mild reactive mediastinal adenopathy.   Thank You, Alessandra Grout, RN, BSN, CCDS,Clinical Documentation Specialist:  [phone]  [phone]=[employer]- Health Information Management

## 2014-05-14 LAB — GLUCOSE, CAPILLARY
GLUCOSE-CAPILLARY: 141 mg/dL — AB (ref 70–99)
Glucose-Capillary: 98 mg/dL (ref 70–99)
Glucose-Capillary: 124 mg/dL — ABNORMAL HIGH (ref 70–99)
Glucose-Capillary: 215 mg/dL — ABNORMAL HIGH (ref 70–99)

## 2014-05-14 MED ORDER — LEVALBUTEROL HCL 0.63 MG/3ML IN NEBU: 0.6300 mg | INHALATION_SOLUTION | RESPIRATORY_TRACT | Status: DC | PRN

## 2014-05-14 MED ORDER — LEVALBUTEROL HCL 0.63 MG/3ML IN NEBU
0.6300 mg | INHALATION_SOLUTION | Freq: Three times a day (TID) | RESPIRATORY_TRACT | Status: DC
  Administered 2014-05-14: 0.63 mg via RESPIRATORY_TRACT
  Filled 2014-05-14 (×6): qty 3

## 2014-05-14 MED ORDER — IPRATROPIUM-ALBUTEROL 0.5-2.5 (3) MG/3ML IN SOLN
3.0000 mL | RESPIRATORY_TRACT | Status: DC
  Administered 2014-05-14 (×2): 3 mL via RESPIRATORY_TRACT
  Filled 2014-05-14 (×3): qty 3

## 2014-05-14 MED ORDER — IPRATROPIUM BROMIDE 0.02 % IN SOLN
0.5000 mg | Freq: Three times a day (TID) | RESPIRATORY_TRACT | Status: DC
  Administered 2014-05-14: 0.5 mg via RESPIRATORY_TRACT
  Filled 2014-05-14 (×3): qty 2.5

## 2014-05-14 NOTE — Progress Notes (Signed)
[facility] Progress Note                                            Patient Demographics  Diane Fry, is a 80 y.o. female, DOB - Apr 13, 1929, OVA:[id number]  Admit date - 05/10/2014     Outpatient Primary MD for the patient is ROBERTS, Diane Given, MD  LOS - 4   Chief Complaint  Patient presents with  . Shortness of Breath  . Cough        Subjective:   Diane Fry today reports some improvement with breathing.  She is not normally on oxygen at home.  She reports she does not want to go to a SNF for rehab.  Her daugther is coming to stay with her tomorrow for a few days.  Assessment & Plan    1. Community acquired bacterial pneumonia with acute hypoxic respiratory failure. Multiple bilateral opacities on CT Chest.  Patient appears to have difficulty taking a deep breath.  Strep pneumo antigen was positive in urine. Continue IV Rocephin.  Supportive care.  Patient remains on oxygen.  Will attempt to wean.  Flutter valve, scheduled nebulizer treatments.  2.  Volume overload.  2D echo was completed and showed normal EF with moderately elevated pulmonary pressures.  Patient has received low dose IV lasix 11/22 and 11/23.    3. Lung nodules on chest x-ray. CT scan with contrast completed.  Will require outpatient pulmonary follow-up.   4.  DM2- ISS. Glucophage held.  CBGs stable.  Lab Results  Component Value Date   HGBA1C 6.5* 09/08/2013    CBG (last 3)   Recent Labs  05/13/14 2222 05/14/14 0802 05/14/14 1155  GLUCAP 158* 98 141*     4. 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 Continue supplementation with chronic steroids.     5. History of CAD. No acute issues stable. On aspirin and statin and beta blocker if blood pressure tolerates.     6. Acute renal failure.  Resolved after gentle  hydration and holding lisinopril.     Code Status: Full  Family Communication: Spoke with dtr Diane Fry. Diane Fry will call me back once she determines whether or not she can come stay with her mother.  Disposition Plan: Home with daughter possibly 11/25.   Procedures:  none    Consults:  none   Medications  Scheduled Meds: . ALPRAZolam  0.25 mg Oral BID  . antiseptic oral rinse  7 mL Mouth Rinse BID  . aspirin EC  81 mg Oral Daily  . atorvastatin  40 mg Oral Daily  . cefTRIAXone (ROCEPHIN)  IV  1 g Intravenous Q24H  . enoxaparin (LOVENOX) injection  40 mg Subcutaneous Q24H  . guaiFENesin  1,200 mg Oral BID  . hydrocortisone  10 mg Oral BID WC  . hydrocortisone  5 mg Oral QHS  . insulin aspart  0-5 Units Subcutaneous QHS  . ipratropium-albuterol  3 mL Nebulization Q4H  . ketorolac  1 drop Both Eyes QID  . levothyroxine  50 mcg Oral QAC breakfast  . metoprolol tartrate  50 mg Oral BID   Continuous Infusions:  PRN Meds:.acetaminophen, albuterol, guaiFENesin-dextromethorphan, HYDROcodone-acetaminophen, ondansetron (ZOFRAN) IV  DVT Prophylaxis  Lovenox    Lab Results  Component Value Date   PLT 212 05/12/2014    Antibiotics    Anti-infectives    Start  Dose/Rate Route Frequency Ordered Stop   05/11/14 1600  cefTRIAXone (ROCEPHIN) 1 g in dextrose 5 % 50 mL IVPB - Premix     1 g100 mL/hr over 30 Minutes Intravenous Every 24 hours 05/10/14 1832 05/18/14 1559   05/11/14 1600  azithromycin (ZITHROMAX) tablet 500 mg  Status:  Discontinued     500 mg Oral Every 24 hours 05/10/14 1832 05/12/14 1403   05/10/14 1545  cefTRIAXone (ROCEPHIN) 1 g in dextrose 5 % 50 mL IVPB     1 g100 mL/hr over 30 Minutes Intravenous  Once 05/10/14 1537 05/10/14 1629   05/10/14 1545  azithromycin (ZITHROMAX) 500 mg in dextrose 5 % 250 mL IVPB     500 mg250 mL/hr over 60 Minutes Intravenous  Once 05/10/14 1537 05/10/14 1749          Objective:   Filed Vitals:   05/14/14 0454  05/14/14 0503 05/14/14 1153 05/14/14 1157  BP: 184/60 178/85    Pulse:      Temp: 97.9 F (36.6 C)     TempSrc: Oral     Resp: 20     Height:      Weight:  75.07 kg (165 lb 8 oz)    SpO2: 100%  79% 93%    Wt Readings from Last 3 Encounters:  05/14/14 75.07 kg (165 lb 8 oz)  09/07/13 76.4 kg (168 lb 6.9 oz)     Intake/Output Summary (Last 24 hours) at 05/14/14 1248 Last data filed at 05/14/14 1001  Gross per 24 hour  Intake    442 ml  Output      0 ml  Net    442 ml     Physical Exam  Awake Alert, Oriented X 3, LOL, No new F.N deficits, Normal affect [place].AT,PERRAL Supple Neck,No JVD, No cervical lymphadenopathy appreciated.  Symmetrical Chest wall movement, Shallow breaths, no wheeze but poor air movement. Mildly increased WOB. RRR,No Gallops,Rubs or new Murmurs, No Parasternal Heave +ve B.Sounds, Abd Soft, No tenderness, No organomegaly appriciated, No rebound - guarding or rigidity. No Cyanosis, Clubbing or edema, No new Rash or bruise      Data Review   Micro Results Recent Results (from the past 240 hour(s))  Culture, blood (routine x 2)     Status: None (Preliminary result)   Collection Time: 05/10/14  3:22 PM  Result Value Ref Range Status   Specimen Description BLOOD LEFT ARM  Final   Special Requests BOTTLES DRAWN AEROBIC AND ANAEROBIC 10CC  Final   Culture  Setup Time   Final    05/10/2014 22:39 Performed at [facility]    Culture   Final           BLOOD CULTURE RECEIVED NO GROWTH TO DATE CULTURE WILL BE HELD FOR 5 DAYS BEFORE ISSUING A FINAL NEGATIVE REPORT Performed at [facility]    Report Status PENDING  Incomplete  Culture, blood (routine x 2)     Status: None (Preliminary result)   Collection Time: 05/10/14  5:41 PM  Result Value Ref Range Status   Specimen Description BLOOD LEFT FOREARM  Final   Special Requests BOTTLES DRAWN AEROBIC ONLY 4 ML  Final   Culture  Setup Time   Final    05/11/2014 01:01 Performed at [facility]    Culture   Final           BLOOD CULTURE RECEIVED NO GROWTH TO DATE CULTURE WILL BE HELD FOR 5 DAYS BEFORE  ISSUING A FINAL NEGATIVE REPORT Performed at [facility]    Report Status PENDING  Incomplete    Radiology Reports Dg Chest 2 View  05/10/2014   CLINICAL DATA:  Cough and shortness of breath.  EXAM: CHEST  2 VIEW  COMPARISON:  None.  FINDINGS: There are multiple poorly defined small nodular densities in both lungs, the largest being 22 x 14 mm just lateral to the left hilum on the PA view. There is also patchy density at the right lung base and in the right middle lobe.  No effusions. No acute osseous abnormality. There is borderline cardiomegaly. Pulmonary vascularity is normal.  IMPRESSION: Multiple patchy areas of nodularity and possible infiltrates. Recommend CT scan of the chest with contrast for further evaluation of the pulmonary nodules.   Electronically Signed   By: Rozetta Nunnery M.D.   On: 05/10/2014 13:10   Ct Chest W Contrast  05/12/2014   CLINICAL DATA:  Shortness of breath/pneumonia.  EXAM: CT CHEST WITH CONTRAST  TECHNIQUE: Multidetector CT imaging of the chest was performed during intravenous contrast administration.  CONTRAST:  159mL OMNIPAQUE IOHEXOL 300 MG/ML  SOLN  COMPARISON:  Chest x-ray 05/10/2014  FINDINGS: Lungs are adequately inflated and demonstrate a patchy bilateral multifocal airspace process with some nodularity likely due to multifocal infection. There is a small right pleural effusion and tiny amount of left pleural fluid. There is evidence of a aspirate material along the dependent portion of the trachea just above the carina which may be the origin of patient's multifocal infectious process which is worse over the right upper lobe.  There is mild cardiomegaly. Left anterior descending coronary artery stent is present. There is mild mediastinal adenopathy likely reactive. No hilar or axillary adenopathy noted. As minimal calcified plaque  over the thoracic aorta. Moderate size hiatal hernia is present.  Images through the upper abdomen demonstrate a 1.7 cm gallstone within a contracted gallbladder. Sub cm hypodensity over the upper pole right renal cortex too small to characterize but likely a cyst. There are mild degenerative changes of the spine with mild compression deformity over the upper lumbar spine, age indeterminate but likely chronic.  IMPRESSION: Patchy bilateral multifocal airspace process worse over the right upper lobe with small bilateral pleural effusions. This likely represents a multifocal pneumonia. There is aspirate material within the dependent portion of the distal trachea which may be the origin of this pneumonia. Mild reactive mediastinal adenopathy.  Mild cardiomegaly.  Left anterior descending coronary artery stent.  Cholelithiasis.  Moderate size hiatal hernia.  Sub cm hypodensity over the upper pole right renal cortex too small to characterize but likely a cyst.  Mild compression deformity over the upper lumbar spine age-indeterminate but likely chronic.   Electronically Signed   By: Marin Olp M.D.   On: 05/12/2014 10:21     CBC  Recent Labs Lab 05/10/14 1522 05/12/14 0535  WBC 11.1* 10.0  HGB 13.6 13.6  HCT 42.8 42.8  PLT 202 212  MCV 92.8 93.7  MCH 29.5 29.8  MCHC 31.8 31.8  RDW 15.9* 15.7*  LYMPHSABS 1.4  --   MONOABS 1.1*  --   EOSABS 0.0  --   BASOSABS 0.0  --     Chemistries   Recent Labs Lab 05/10/14 1522 05/12/14 0535  NA 140 142  K 4.1 4.2  CL 98 98  CO2 28 27  GLUCOSE 111* 153*  BUN 22 16  CREATININE 1.20* 0.79  CALCIUM 9.5 9.4  Time Spent in minutes   35   [place], Bobby Rumpf PA-C on 05/14/2014 at 12:48 PM  Between 7am to 7pm - Pager - [phone]  After 7pm go to [email or web address] - password [email or web address]  And look for the night coverage person covering for me after hours  [facility] Office  [phone]

## 2014-05-14 NOTE — Progress Notes (Signed)
05/14/14 Patient was ambulated with out oxygen sats at 92-95 taking off 02 and breathing fine.

## 2014-05-14 NOTE — Progress Notes (Addendum)
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   Her chest x-ray showed multiple patchy areas of nodularity with possible infiltrates. She was found to be hypoxic on room air to 89-90% . Patient admitted through the ED on 05/10/14 with CAP.    PT Comments    Pt currently with functional limitations due to weakness, decreased endurance, decreased mobility and decreased balance. Pt will benefit from skilled PT to increase independence and safety with mobility to allow discharge to home with HHPT and 24 hour assistance. Pt sitting in chair at start of session and requested using BSC prior to ambulating in hallway. Pt supervision with transfers and able to clean herself on [facility]. Pt ambulated in hallway without AD and on RA. Had one loss of balance and able to right herself with the assistance of PT. Pt denied any dizziness or lightheadedness at time of loss of balance.     Follow Up Recommendations  Home health PT;Supervision/Assistance - 24 hour     Equipment Recommendations  None recommended by PT    Recommendations for Other Services       Precautions / Restrictions Precautions Precautions: None Restrictions Weight Bearing Restrictions: No    Mobility  Bed Mobility                  Transfers Overall transfer level: Needs assistance Equipment used: None Transfers: Sit to/from Stand Sit to Stand: Supervision         General transfer comment: Pt able to safely transfer sit to stand from recliner and from [facility] during treatment session without physical assistance. Pt supervising for safety and  cuing.   Ambulation/Gait Ambulation/Gait assistance: Min guard Ambulation Distance (Feet): 260 Feet Assistive device: None Gait Pattern/deviations: Step-through pattern;Decreased stride length;Wide base of support Gait velocity: Decreased Gait velocity interpretation: Below normal speed for age/gender General Gait Details: Pt able to ambulate to stairs from room and then back around circle before heading back into room. Pt did not require assistive device and ambulated on RA per nurse tech's previous note. Pt had one episode of loss of balance to the Right but was able to stabilize herself with assistance from PT. Pt believed this was because she is not used to walking in socks and denied feeling lightheaded or dizzy.    Stairs Stairs: Yes Stairs assistance: Min guard Stair Management: One rail Right;Step to pattern;Forwards Number of Stairs: 4 General stair comments: Pt able to safely negotiate 4 stairs with use of railing on Right with both ascending and descending. PT min guard for safety.  Wheelchair Mobility    Modified Rankin (Stroke Patients Only)       Balance Overall balance assessment: Needs assistance Sitting-balance support: Feet supported;No upper extremity supported Sitting balance-Leahy Scale: Good Sitting balance - Comments: Pt able to sit on [facility] and clean herself without assistance.    Standing balance support: During functional activity;No upper extremity supported Standing balance-Leahy Scale: Good Standing balance comment: Pt able to safely ambulate in hallway.                     Cognition Arousal/Alertness: Awake/alert Behavior During Therapy:  WFL for tasks assessed/performed Overall Cognitive Status: Within Functional Limits for tasks assessed                      Exercises      General Comments  Pt used BSC for urination at start of session prior to ambulating. Pt able to clean herself. BSC emptied by nurse tech at end of session.        Pertinent Vitals/Pain Pain Assessment: No/denies pain    Home Living                      Prior Function            PT Goals (current goals can now be found in the care plan section) Acute Rehab PT Goals Patient Stated Goal: Go home Progress towards PT goals: Progressing toward goals    Frequency  Min 3X/week    PT Plan Current plan remains appropriate    Co-evaluation             End of Session Equipment Utilized During Treatment: Gait belt Activity Tolerance: Patient tolerated treatment well Patient left: in chair;with call bell/phone within reach;with chair alarm set     Time: 1530-1554 PT Time Calculation (min) (ACUTE ONLY): 24 min  Charges:  $Gait Training: 8-22 mins $Therapeutic Activity: 8-22 mins                    G Codes:      Kennth Vanbenschoten, Tessie Fass 05/14/2014, 5:59 PM

## 2014-05-14 NOTE — Clinical Social Work Psychosocial (Signed)
Clinical Social Work Department BRIEF PSYCHOSOCIAL ASSESSMENT 05/14/2014  Patient:  Diane Fry, Diane Fry     Account Number:  [id number]     Admit date:  05/10/2014  Clinical Social Worker:  Lovey Newcomer  Date/Time:  05/14/2014 02:03 PM  Referred by:  Physician  Date Referred:  05/14/2014 Referred for  SNF Placement   Other Referral:   NA   Interview type:  Patient Other interview type:   Patient alert and oriented at time of assessment.    PSYCHOSOCIAL DATA Living Status:  ALONE Admitted from facility:   Level of care:   Primary support name:  [name] Primary support relationship to patient:  CHILD, ADULT Degree of support available:   Support is good.    CURRENT CONCERNS Current Concerns  Post-Acute Placement   Other Concerns:   NA    SOCIAL WORK ASSESSMENT / PLAN CSW met with patient at bedside as there are concerns about the patient's disposition. Patient reports that she will not consider any kind of SNF placement at this time and plans to return home at discharge. CSW explained that PT has recommended 24 hr supervision at discharge. Patient and daughter confirm that "Diane Fry" will be staying with the patient at home once discharge. Patient appeared calm and engaged in assessment. Patient's daughter sounded overwhelmed over the phone.   Assessment/plan status:  No Further Intervention Required Other assessment/ plan:   NONE   Information/referral to community resources:   NONE    PATIENT'S/FAMILY'S RESPONSE TO PLAN OF CARE: Patient and daughter refusing SNF placement. No other CSW needs identified at this time. CSW signing off.       Liz Beach MSW, [place], [place], [phone]

## 2014-05-14 NOTE — Plan of Care (Signed)
Problem: Discharge Progression Outcomes Goal: Dyspnea controlled Outcome: Progressing Goal: Home O2 if indicated Outcome: Progressing

## 2014-05-15 DIAGNOSIS — I480 Paroxysmal atrial fibrillation: Secondary | ICD-10-CM | POA: Diagnosis present

## 2014-05-15 LAB — GLUCOSE, CAPILLARY
GLUCOSE-CAPILLARY: 145 mg/dL — AB (ref 70–99)
GLUCOSE-CAPILLARY: 145 mg/dL — AB (ref 70–99)
Glucose-Capillary: 95 mg/dL (ref 70–99)

## 2014-05-15 MED ORDER — AMPICILLIN 500 MG PO CAPS: 500.0000 mg | ORAL_CAPSULE | Freq: Three times a day (TID) | ORAL | Status: DC

## 2014-05-15 MED ORDER — GUAIFENESIN ER 600 MG PO TB12: 1200.0000 mg | ORAL_TABLET | Freq: Two times a day (BID) | ORAL | Status: DC

## 2014-05-15 MED ORDER — HYDROCORTISONE 10 MG PO TABS: 10.0000 mg | ORAL_TABLET | Freq: Two times a day (BID) | ORAL | Status: DC

## 2014-05-15 MED ORDER — FUROSEMIDE 10 MG/ML IJ SOLN
20.0000 mg | Freq: Once | INTRAMUSCULAR | Status: AC
  Administered 2014-05-15: 20 mg via INTRAVENOUS
  Filled 2014-05-15: qty 2

## 2014-05-15 MED ORDER — LEVALBUTEROL HCL 0.63 MG/3ML IN NEBU: 0.6300 mg | INHALATION_SOLUTION | RESPIRATORY_TRACT | Status: DC | PRN

## 2014-05-15 MED ORDER — HYDROCORTISONE 5 MG PO TABS: 5.0000 mg | ORAL_TABLET | Freq: Every day | ORAL | Status: DC

## 2014-05-15 NOTE — Progress Notes (Signed)
AVS discharge instructions were reviewed with patient. Patient was given prescription for xopenex and ampicillin to take to her pharmacy. Patient stated that she did not have any questions. Staff will assist pt to her transportation.

## 2014-05-15 NOTE — Discharge Summary (Signed)
Physician Discharge Summary  Diane Fry VZC:[id number] DOB: January 06, 1929 DOA: 05/10/2014  PCP: Myriam Jacobson, MD  Admit date: 05/10/2014 Discharge date: 05/15/2014  Time spent: 45 minutes  Recommendations for Outpatient Follow-up:  1. Patient started on oxygen.  HHRN to assist. 2. Patient politely refused SNF will will discharge to a friends house   Discharge Diagnoses:  Principal Problem:   Community acquired bacterial pneumonia Active Problems:   Pituitary macroadenoma with extrasellar extension   Coronary atherosclerosis of native coronary artery   Type 2 diabetes mellitus   Hypotension   Acute kidney injury   Paroxysmal atrial fibrillation   Discharge Condition: stable on 2 liters of oxygen.  Diet recommendation: heart healthy  Filed Weights   05/10/14 1458 05/10/14 1822 05/14/14 0503  Weight: 81.194 kg (179 lb) 76.794 kg (169 lb 4.8 oz) 75.07 kg (165 lb 8 oz)    History of present illness:  80 year old female with history of hypertension, coronary artery disease s/p PCI in 2004 , type 2 diabetes mellitus, pituitary macroadenoma status post resection on chronic steroids, who presented to the urgent care with cough with purulent expectoration for past 3 days.  At the urgent care she had a chest x-ray done which showed multiple patchy areas of nodularity with possible infiltrates. She was found to be hypoxic at rest on room air to 89-90% . She was thus sent to the ED.  Hospital Course:  1. Community acquired bacterial pneumonia with acute hypoxic respiratory failure. Multiple bilateral opacities on CT Chest. Patient appeared to have difficulty taking a deep breath. Strep pneumo antigen was positive in urine. She was treated with IV rocephin in patient and discharged on ampicillin.  Patient remains on oxygen at the time of discharge (2L continuous). Unable to wean.  Patient was 88% on room air at rest and would drop with any exertion.  She has a recent dx of P-Afib  and is on Xeralto, consequently, Xopenex nebs were used.    2. Moderate Pulmonary HTN. 2D echo was completed and showed normal EF with moderately elevated pulmonary pressures. Patient has received low dose IV lasix 11/22 and 11/23 and is feeling better.  She will be discharged to home with oxygen as her saturations drop to 87% with ambulation.   3. Lung nodules on chest x-ray. CT scan with contrast completed. Will require outpatient pulmonary follow-up.   4. DM2- ISS. Glucophage held. CBGs stable.   Recent Labs    Lab Results  Component Value Date   HGBA1C 6.5* 09/08/2013      CBG (last 3)   Recent Labs (last 2 labs)      Recent Labs  05/13/14 2222 05/14/14 0802 05/14/14 1155  GLUCAP 158* 98 141*       4. 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 Continue supplementation with chronic steroids.     5. History of CAD. No acute issues stable. On aspirin and statin and beta blocker if blood pressure tolerates.     6. Acute renal failure. Resolved after gentle hydration and holding lisinopril.    7. Per patient she is on Xeralto due a recent diagnosis of paroxysmal afib (Dr. Terrence Dupont).  EKG inpatient shows NSR.    Discharge Exam: Filed Vitals:   05/14/14 2243 05/15/14 0607 05/15/14 0932 05/15/14 0950  BP: 145/59 142/61    Pulse: 70 61 72   Temp: 98.1 F (36.7 C) 97.3 F (36.3 C)    TempSrc: Oral Oral    Resp: 20 20 16  Height:      Weight:      SpO2: 97% 99% 97% 90%   Awake Alert, Oriented X 3, LOL, No new F.N deficits, Normal affect. Lying flat in bed.   [place].AT,PERRAL Supple Neck,No JVD, No cervical lymphadenopathy appreciated.  Symmetrical Chest wall movement, Shallow breaths, no wheeze but poor air movement. Mildly increased WOB. RRR,No Gallops,Rubs or new Murmurs, No Parasternal Heave +ve B.Sounds, Abd Soft, No tenderness, No organomegaly appreciated, No rebound. No Cyanosis, Clubbing or edema, No new Rash or  bruise   Discharge Instructions   Discharge Instructions    Diet - low sodium heart healthy    Complete by:  As directed      Increase activity slowly    Complete by:  As directed           Current Discharge Medication List    START taking these medications   Details  ampicillin (PRINCIPEN) 500 MG capsule Take 1 capsule (500 mg total) by mouth 3 (three) times daily. Qty: 12 capsule, Refills: 0    guaiFENesin (MUCINEX) 600 MG 12 hr tablet Take 2 tablets (1,200 mg total) by mouth 2 (two) times daily.    levalbuterol (XOPENEX) 0.63 MG/3ML nebulizer solution Take 3 mLs (0.63 mg total) by nebulization every 4 (four) hours as needed for wheezing or shortness of breath. Qty: 3 mL, Refills: 12      CONTINUE these medications which have CHANGED   Details  !! hydrocortisone (CORTEF) 10 MG tablet Take 1 tablet (10 mg total) by mouth 2 (two) times daily with a meal. Qty: 20 tablet, Refills: 0    !! hydrocortisone (CORTEF) 5 MG tablet Take 1 tablet (5 mg total) by mouth at bedtime. Qty: 10 tablet, Refills: 0     !! - Potential duplicate medications found. Please discuss with provider.    CONTINUE these medications which have NOT CHANGED   Details  ALPRAZolam (XANAX) 0.25 MG tablet Take 0.25 mg by mouth 2 (two) times daily.    aspirin EC 81 MG tablet Take 81 mg by mouth daily.    atorvastatin (LIPITOR) 80 MG tablet Take 40 mg by mouth daily.    HYDROcodone-acetaminophen (NORCO/VICODIN) 5-325 MG per tablet Take 1 tablet by mouth 2 (two) times daily.    levothyroxine (SYNTHROID, LEVOTHROID) 50 MCG tablet Take 50 mcg by mouth daily.    metFORMIN (GLUCOPHAGE) 500 MG tablet Take 500 mg by mouth daily.    metoprolol (LOPRESSOR) 50 MG tablet Take 25 mg by mouth 2 (two) times daily.     PROLENSA 0.07 % SOLN Place 1 drop into both eyes daily.    ramipril (ALTACE) 10 MG capsule Take 10 mg by mouth 2 (two) times daily.    XARELTO 15 MG TABS tablet Take 15 mg by mouth daily. with  food Refills: 3      STOP taking these medications     hydrochlorothiazide (MICROZIDE) 12.5 MG capsule        No Known Allergies Follow-up Information    Follow up with Christinia Gully, MD On 05/23/2014.   Specialty:  Pulmonary Disease   Why:  10:45 am.  Arrive 15 minutes early and bring all of your medications with you. Thx.   Contact information:   [address] [address] [phone]       Follow up with ROBERTS, Sharol Given, MD In 2 weeks.   Specialty:  Internal Medicine   Why:  follow up for strep pneumo   Contact  information:   [place], [place] [address] [phone]        The results of significant diagnostics from this hospitalization (including imaging, microbiology, ancillary and laboratory) are listed below for reference.    Significant Diagnostic Studies: Dg Chest 2 View  05/10/2014   CLINICAL DATA:  Cough and shortness of breath.  EXAM: CHEST  2 VIEW  COMPARISON:  None.  FINDINGS: There are multiple poorly defined small nodular densities in both lungs, the largest being 22 x 14 mm just lateral to the left hilum on the PA view. There is also patchy density at the right lung base and in the right middle lobe.  No effusions. No acute osseous abnormality. There is borderline cardiomegaly. Pulmonary vascularity is normal.  IMPRESSION: Multiple patchy areas of nodularity and possible infiltrates. Recommend CT scan of the chest with contrast for further evaluation of the pulmonary nodules.   Electronically Signed   By: Rozetta Nunnery M.D.   On: 05/10/2014 13:10   Ct Chest W Contrast  05/12/2014   CLINICAL DATA:  Shortness of breath/pneumonia.  EXAM: CT CHEST WITH CONTRAST  TECHNIQUE: Multidetector CT imaging of the chest was performed during intravenous contrast administration.  CONTRAST:  180mL OMNIPAQUE IOHEXOL 300 MG/ML  SOLN  COMPARISON:  Chest x-ray 05/10/2014  FINDINGS: Lungs are adequately inflated and demonstrate a patchy bilateral  multifocal airspace process with some nodularity likely due to multifocal infection. There is a small right pleural effusion and tiny amount of left pleural fluid. There is evidence of a aspirate material along the dependent portion of the trachea just above the carina which may be the origin of patient's multifocal infectious process which is worse over the right upper lobe.  There is mild cardiomegaly. Left anterior descending coronary artery stent is present. There is mild mediastinal adenopathy likely reactive. No hilar or axillary adenopathy noted. As minimal calcified plaque over the thoracic aorta. Moderate size hiatal hernia is present.  Images through the upper abdomen demonstrate a 1.7 cm gallstone within a contracted gallbladder. Sub cm hypodensity over the upper pole right renal cortex too small to characterize but likely a cyst. There are mild degenerative changes of the spine with mild compression deformity over the upper lumbar spine, age indeterminate but likely chronic.  IMPRESSION: Patchy bilateral multifocal airspace process worse over the right upper lobe with small bilateral pleural effusions. This likely represents a multifocal pneumonia. There is aspirate material within the dependent portion of the distal trachea which may be the origin of this pneumonia. Mild reactive mediastinal adenopathy.  Mild cardiomegaly.  Left anterior descending coronary artery stent.  Cholelithiasis.  Moderate size hiatal hernia.  Sub cm hypodensity over the upper pole right renal cortex too small to characterize but likely a cyst.  Mild compression deformity over the upper lumbar spine age-indeterminate but likely chronic.   Electronically Signed   By: Marin Olp M.D.   On: 05/12/2014 10:21    Microbiology: Recent Results (from the past 240 hour(s))  Culture, blood (routine x 2)     Status: None (Preliminary result)   Collection Time: 05/10/14  3:22 PM  Result Value Ref Range Status   Specimen  Description BLOOD LEFT ARM  Final   Special Requests BOTTLES DRAWN AEROBIC AND ANAEROBIC 10CC  Final   Culture  Setup Time   Final    05/10/2014 22:39 Performed at [place]   Final  BLOOD CULTURE RECEIVED NO GROWTH TO DATE CULTURE WILL BE HELD FOR 5 DAYS BEFORE ISSUING A FINAL NEGATIVE REPORT Performed at [facility]    Report Status PENDING  Incomplete  Culture, blood (routine x 2)     Status: None (Preliminary result)   Collection Time: 05/10/14  5:41 PM  Result Value Ref Range Status   Specimen Description BLOOD LEFT FOREARM  Final   Special Requests BOTTLES DRAWN AEROBIC ONLY 4 ML  Final   Culture  Setup Time   Final    05/11/2014 01:01 Performed at [facility]    Culture   Final           BLOOD CULTURE RECEIVED NO GROWTH TO DATE CULTURE WILL BE HELD FOR 5 DAYS BEFORE ISSUING A FINAL NEGATIVE REPORT Performed at [facility]    Report Status PENDING  Incomplete     Labs: Basic Metabolic Panel:  Recent Labs Lab 05/10/14 1522 05/12/14 0535  NA 140 142  K 4.1 4.2  CL 98 98  CO2 28 27  GLUCOSE 111* 153*  BUN 22 16  CREATININE 1.20* 0.79  CALCIUM 9.5 9.4   CBC:  Recent Labs Lab 05/10/14 1522 05/12/14 0535  WBC 11.1* 10.0  NEUTROABS 8.6*  --   HGB 13.6 13.6  HCT 42.8 42.8  MCV 92.8 93.7  PLT 202 212   CBG:  Recent Labs Lab 05/14/14 1155 05/14/14 1647 05/14/14 2240 05/15/14 0750 05/15/14 1140  GLUCAP 141* 124* 215* 95 145*       Signed:  YorkBobby Rumpf, PA-C  [facility] 05/15/2014, 12:00 PM

## 2014-05-15 NOTE — Plan of Care (Signed)
Problem: Discharge Progression Outcomes Goal: Dyspnea controlled Outcome: Progressing Goal: O2 sats > or equal 90% or at baseline Outcome: Progressing Goal: Independent ADLs or Home Health Care Outcome: Progressing Goal: Discharge plan in place and appropriate Outcome: Completed/Met Date Met:  05/15/14 Goal: Pain controlled with appropriate interventions Outcome: Adequate for Discharge Goal: Tolerating diet Outcome: Adequate for Discharge Goal: Activity appropriate for discharge plan Outcome: Progressing

## 2014-05-15 NOTE — Progress Notes (Signed)
Pt refuses neb tx at this time. Pt states she doesn't feel short of breath

## 2014-05-15 NOTE — Discharge Instructions (Signed)
Please complete your antibiotics as prescribed.   See Dr. Melvyn Novas as scheduled as you have lung nodules that need to be followed.

## 2014-05-16 LAB — CULTURE, BLOOD (ROUTINE X 2): Culture: NO GROWTH

## 2014-05-17 LAB — CULTURE, BLOOD (ROUTINE X 2): CULTURE: NO GROWTH

## 2014-05-17 MED ORDER — LEVALBUTEROL HCL 0.63 MG/3ML IN NEBU: 0.6300 mg | INHALATION_SOLUTION | RESPIRATORY_TRACT | Status: DC | PRN

## 2014-05-21 DEATH — deceased

## 2014-05-23 ENCOUNTER — Institutional Professional Consult (permissible substitution): Payer: Medicare Other | Admitting: Internal Medicine

## 2014-05-24 ENCOUNTER — Encounter: Payer: Self-pay | Admitting: Internal Medicine

## 2014-06-24 ENCOUNTER — Ambulatory Visit
Admission: RE | Admit: 2014-06-24 | Discharge: 2014-06-24 | Disposition: A | Discharge status: Home / Self Care | Payer: Medicare Other | Source: Ambulatory Visit | Attending: Internal Medicine | Admitting: Internal Medicine

## 2014-06-24 ENCOUNTER — Other Ambulatory Visit: Payer: Self-pay | Admitting: Internal Medicine

## 2014-06-24 DIAGNOSIS — R0602 Shortness of breath: Secondary | ICD-10-CM

## 2014-10-11 ENCOUNTER — Other Ambulatory Visit (HOSPITAL_COMMUNITY): Payer: Self-pay | Admitting: Cardiology

## 2014-10-11 DIAGNOSIS — R079 Chest pain, unspecified: Secondary | ICD-10-CM

## 2014-10-18 ENCOUNTER — Encounter (HOSPITAL_COMMUNITY): Payer: Medicare Other

## 2014-10-21 ENCOUNTER — Encounter (HOSPITAL_COMMUNITY)
Admission: RE | Admit: 2014-10-21 | Discharge: 2014-10-21 | Disposition: A | Discharge status: Home / Self Care | Payer: Medicare Other | Source: Ambulatory Visit | Attending: Cardiology | Admitting: Cardiology

## 2014-10-21 ENCOUNTER — Encounter (HOSPITAL_COMMUNITY): Payer: Medicare Other

## 2014-10-21 DIAGNOSIS — R079 Chest pain, unspecified: Secondary | ICD-10-CM | POA: Insufficient documentation

## 2014-10-21 LAB — NM MYOCAR MULTI W/SPECT W/WALL MOTION / EF
CHL CUP STRESS STAGE 1 DBP: 74 mmHg
CHL CUP STRESS STAGE 1 GRADE: 0 %
CHL CUP STRESS STAGE 1 SBP: 146 mmHg
CHL CUP STRESS STAGE 3 DBP: 72 mmHg
CHL CUP STRESS STAGE 3 SPEED: 0 mph
CHL CUP STRESS STAGE 4 DBP: 72 mmHg
CHL CUP STRESS STAGE 4 GRADE: 0 %
CHL CUP STRESS STAGE 4 SPEED: 0 mph
CHL CUP STRESS STAGE 5 DBP: 73 mmHg
CHL CUP STRESS STAGE 5 SBP: 127 mmHg
CHL CUP STRESS STAGE 5 SPEED: 0 mph
CSEPPBP: 127 mmHg
CSEPPHR: 65 {beats}/min
CSEPPMHR: 47 %
Estimated workload: 1 METS
Stage 1 HR: 54 {beats}/min
Stage 1 Speed: 0 mph
Stage 2 Grade: 0 %
Stage 2 HR: 54 {beats}/min
Stage 2 Speed: 0 mph
Stage 3 Grade: 0 %
Stage 3 HR: 67 {beats}/min
Stage 3 SBP: 137 mmHg
Stage 4 HR: 66 {beats}/min
Stage 4 SBP: 130 mmHg
Stage 5 Grade: 0 %
Stage 5 HR: 65 {beats}/min

## 2014-10-21 MED ORDER — TECHNETIUM TC 99M SESTAMIBI GENERIC - CARDIOLITE
10.0000 | Freq: Once | INTRAVENOUS | Status: AC | PRN
  Administered 2014-10-21: 10 via INTRAVENOUS

## 2014-10-21 MED ORDER — REGADENOSON 0.4 MG/5ML IV SOLN
INTRAVENOUS | Status: AC
  Filled 2014-10-21: qty 5

## 2014-10-21 MED ORDER — REGADENOSON 0.4 MG/5ML IV SOLN
0.4000 mg | Freq: Once | INTRAVENOUS | Status: AC
  Administered 2014-10-21: 0.4 mg via INTRAVENOUS

## 2014-10-21 MED ORDER — REGADENOSON 0.4 MG/5ML IV SOLN
0.4000 mg | Freq: Once | INTRAVENOUS | Status: DC
Start: 2014-10-21 — End: 2014-10-27

## 2014-10-21 MED ORDER — TECHNETIUM TC 99M SESTAMIBI GENERIC - CARDIOLITE
30.0000 | Freq: Once | INTRAVENOUS | Status: AC | PRN
  Administered 2014-10-21: 30 via INTRAVENOUS

## 2014-10-31 ENCOUNTER — Ambulatory Visit (HOSPITAL_COMMUNITY)
Admission: RE | Admit: 2014-10-31 | Discharge: 2014-11-01 | Disposition: A | Discharge status: Home / Self Care | Payer: Medicare Other | Source: Ambulatory Visit | Attending: Cardiology | Admitting: Cardiology

## 2014-10-31 ENCOUNTER — Encounter (HOSPITAL_COMMUNITY): Payer: Self-pay | Admitting: Cardiology

## 2014-10-31 ENCOUNTER — Encounter (HOSPITAL_COMMUNITY)
Admission: RE | Disposition: A | Discharge status: Home / Self Care | Payer: Medicare Other | Source: Ambulatory Visit | Attending: Cardiology

## 2014-10-31 DIAGNOSIS — I2582 Chronic total occlusion of coronary artery: Secondary | ICD-10-CM | POA: Insufficient documentation

## 2014-10-31 DIAGNOSIS — I209 Angina pectoris, unspecified: Secondary | ICD-10-CM | POA: Diagnosis present

## 2014-10-31 DIAGNOSIS — I2510 Atherosclerotic heart disease of native coronary artery without angina pectoris: Secondary | ICD-10-CM | POA: Diagnosis not present

## 2014-10-31 HISTORY — PX: CORONARY ANGIOPLASTY WITH STENT PLACEMENT: SHX49

## 2014-10-31 HISTORY — DX: Pneumonia, unspecified organism: J18.9

## 2014-10-31 HISTORY — PX: PERCUTANEOUS CORONARY STENT INTERVENTION (PCI-S): SHX5485

## 2014-10-31 HISTORY — DX: Anxiety disorder, unspecified: F41.9

## 2014-10-31 HISTORY — PX: CARDIAC CATHETERIZATION: SHX172

## 2014-10-31 LAB — GLUCOSE, CAPILLARY
GLUCOSE-CAPILLARY: 103 mg/dL — AB (ref 65–99)
GLUCOSE-CAPILLARY: 98 mg/dL (ref 65–99)
Glucose-Capillary: 102 mg/dL — ABNORMAL HIGH (ref 65–99)

## 2014-10-31 LAB — POCT ACTIVATED CLOTTING TIME
ACTIVATED CLOTTING TIME: 356 s
Activated Clotting Time: 435 seconds

## 2014-10-31 LAB — PROTIME-INR
INR: 1.04 (ref 0.00–1.49)
Prothrombin Time: 13.7 seconds (ref 11.6–15.2)

## 2014-10-31 SURGERY — LEFT HEART CATH AND CORONARY ANGIOGRAPHY
Anesthesia: LOCAL

## 2014-10-31 MED ORDER — NITROGLYCERIN IN D5W 200-5 MCG/ML-% IV SOLN
INTRAVENOUS | Status: DC | PRN
  Administered 2014-10-31: 5 ug/min via INTRAVENOUS

## 2014-10-31 MED ORDER — LEVOTHYROXINE SODIUM 50 MCG PO TABS
50.0000 ug | ORAL_TABLET | Freq: Every day | ORAL | Status: DC
  Administered 2014-11-01: 50 ug via ORAL
  Filled 2014-10-31: qty 1

## 2014-10-31 MED ORDER — ATORVASTATIN CALCIUM 40 MG PO TABS
40.0000 mg | ORAL_TABLET | Freq: Every day | ORAL | Status: DC
  Administered 2014-10-31: 20:00:00 40 mg via ORAL
  Filled 2014-10-31: qty 1

## 2014-10-31 MED ORDER — BIVALIRUDIN 250 MG IV SOLR
250.0000 mg | INTRAVENOUS | Status: DC | PRN
  Administered 2014-10-31: 1.75 mg/kg/h via INTRAVENOUS

## 2014-10-31 MED ORDER — CLOPIDOGREL BISULFATE 75 MG PO TABS: 75.0000 mg | ORAL_TABLET | Freq: Every day | ORAL | Status: DC

## 2014-10-31 MED ORDER — FENTANYL CITRATE (PF) 100 MCG/2ML IJ SOLN
INTRAMUSCULAR | Status: AC
  Filled 2014-10-31: qty 2

## 2014-10-31 MED ORDER — ASPIRIN 81 MG PO CHEW
81.0000 mg | CHEWABLE_TABLET | ORAL | Status: AC
  Administered 2014-10-31: 81 mg via ORAL

## 2014-10-31 MED ORDER — ASPIRIN 81 MG PO CHEW
81.0000 mg | CHEWABLE_TABLET | Freq: Every day | ORAL | Status: DC
  Administered 2014-11-01: 08:00:00 81 mg via ORAL
  Filled 2014-10-31: qty 1

## 2014-10-31 MED ORDER — SODIUM CHLORIDE 0.9 % WEIGHT BASED INFUSION: 75.0000 mL/h | INTRAVENOUS | Status: DC

## 2014-10-31 MED ORDER — SODIUM CHLORIDE 0.9 % IV SOLN
INTRAVENOUS | Status: AC
  Administered 2014-10-31: 12:00:00 via INTRAVENOUS

## 2014-10-31 MED ORDER — SODIUM CHLORIDE 0.9 % IJ SOLN: 3.0000 mL | INTRAMUSCULAR | Status: DC | PRN

## 2014-10-31 MED ORDER — NITROGLYCERIN IN D5W 200-5 MCG/ML-% IV SOLN
INTRAVENOUS | Status: AC
  Filled 2014-10-31: qty 250

## 2014-10-31 MED ORDER — BIVALIRUDIN 250 MG IV SOLR
INTRAVENOUS | Status: AC
Start: 2014-10-31 — End: 2014-10-31
  Filled 2014-10-31: qty 250

## 2014-10-31 MED ORDER — FAMOTIDINE IN NACL 20-0.9 MG/50ML-% IV SOLN
INTRAVENOUS | Status: AC
  Filled 2014-10-31: qty 50

## 2014-10-31 MED ORDER — ONDANSETRON HCL 4 MG/2ML IJ SOLN: 4.0000 mg | Freq: Four times a day (QID) | INTRAMUSCULAR | Status: DC | PRN

## 2014-10-31 MED ORDER — SODIUM CHLORIDE 0.9 % IJ SOLN: 3.0000 mL | Freq: Two times a day (BID) | INTRAMUSCULAR | Status: DC

## 2014-10-31 MED ORDER — CLOPIDOGREL BISULFATE 300 MG PO TABS
ORAL_TABLET | ORAL | Status: AC
  Filled 2014-10-31: qty 1

## 2014-10-31 MED ORDER — SODIUM CHLORIDE 0.9 % IV SOLN: 250.0000 mL | INTRAVENOUS | Status: DC | PRN

## 2014-10-31 MED ORDER — GUAIFENESIN-DM 100-10 MG/5ML PO SYRP
15.0000 mL | ORAL_SOLUTION | ORAL | Status: DC | PRN
  Administered 2014-10-31: 15 mL via ORAL
  Filled 2014-10-31: qty 15

## 2014-10-31 MED ORDER — INSULIN ASPART 100 UNIT/ML ~~LOC~~ SOLN
0.0000 [IU] | Freq: Three times a day (TID) | SUBCUTANEOUS | Status: DC
Start: 2014-10-31 — End: 2014-11-01
  Administered 2014-11-01 (×2): 1 [IU] via SUBCUTANEOUS

## 2014-10-31 MED ORDER — MIDAZOLAM HCL 2 MG/2ML IJ SOLN
INTRAMUSCULAR | Status: AC
Start: 2014-10-31 — End: 2014-10-31
  Filled 2014-10-31: qty 2

## 2014-10-31 MED ORDER — SODIUM CHLORIDE 0.9 % IJ SOLN
3.0000 mL | Freq: Two times a day (BID) | INTRAMUSCULAR | Status: DC
  Administered 2014-11-01: 3 mL via INTRAVENOUS

## 2014-10-31 MED ORDER — HEPARIN (PORCINE) IN NACL 2-0.9 UNIT/ML-% IJ SOLN
INTRAMUSCULAR | Status: AC
  Filled 2014-10-31: qty 1000

## 2014-10-31 MED ORDER — IOHEXOL 350 MG/ML SOLN
INTRAVENOUS | Status: DC | PRN
Start: 2014-10-31 — End: 2014-10-31
  Administered 2014-10-31: 200 mL via INTRA_ARTERIAL

## 2014-10-31 MED ORDER — NITROGLYCERIN IN D5W 200-5 MCG/ML-% IV SOLN
5.0000 ug/min | INTRAVENOUS | Status: DC
  Administered 2014-10-31: 5 ug/min via INTRAVENOUS

## 2014-10-31 MED ORDER — ACETAMINOPHEN 325 MG PO TABS
650.0000 mg | ORAL_TABLET | ORAL | Status: DC | PRN
  Filled 2014-10-31: qty 2

## 2014-10-31 MED ORDER — METOPROLOL TARTRATE 12.5 MG HALF TABLET
12.5000 mg | ORAL_TABLET | Freq: Two times a day (BID) | ORAL | Status: DC
  Administered 2014-10-31 – 2014-11-01 (×2): 12.5 mg via ORAL
  Filled 2014-10-31 (×2): qty 1

## 2014-10-31 MED ORDER — CLOPIDOGREL BISULFATE 75 MG PO TABS
75.0000 mg | ORAL_TABLET | Freq: Every day | ORAL | Status: DC
  Administered 2014-11-01: 75 mg via ORAL
  Filled 2014-10-31: qty 1

## 2014-10-31 MED ORDER — FAMOTIDINE IN NACL 20-0.9 MG/50ML-% IV SOLN
INTRAVENOUS | Status: DC | PRN
  Administered 2014-10-31: 20 mg via INTRAVENOUS

## 2014-10-31 MED ORDER — ALPRAZOLAM 0.25 MG PO TABS
0.2500 mg | ORAL_TABLET | Freq: Every day | ORAL | Status: DC
  Administered 2014-10-31: 0.25 mg via ORAL
  Filled 2014-10-31: qty 1

## 2014-10-31 MED ORDER — CLOPIDOGREL BISULFATE 300 MG PO TABS
ORAL_TABLET | ORAL | Status: DC | PRN
  Administered 2014-10-31: 300 mg via ORAL

## 2014-10-31 MED ORDER — HYDROCODONE-ACETAMINOPHEN 5-325 MG PO TABS
1.0000 | ORAL_TABLET | Freq: Two times a day (BID) | ORAL | Status: DC | PRN
  Administered 2014-10-31 (×2): 1 via ORAL
  Filled 2014-10-31 (×2): qty 1

## 2014-10-31 MED ORDER — LIDOCAINE HCL (PF) 1 % IJ SOLN
INTRAMUSCULAR | Status: AC
  Filled 2014-10-31: qty 30

## 2014-10-31 MED ORDER — NITROGLYCERIN 1 MG/10 ML FOR IR/CATH LAB
INTRA_ARTERIAL | Status: AC
  Filled 2014-10-31: qty 10

## 2014-10-31 MED ORDER — BIVALIRUDIN 250 MG IV SOLR
INTRAVENOUS | Status: AC
  Filled 2014-10-31: qty 250

## 2014-10-31 MED ORDER — ASPIRIN 81 MG PO CHEW
CHEWABLE_TABLET | ORAL | Status: AC
  Filled 2014-10-31: qty 1

## 2014-10-31 MED ORDER — SODIUM CHLORIDE 0.9 % WEIGHT BASED INFUSION
225.0000 mL/h | INTRAVENOUS | Status: DC
  Administered 2014-10-31: 225 mL/h via INTRAVENOUS

## 2014-10-31 MED ORDER — NITROGLYCERIN 1 MG/10 ML FOR IR/CATH LAB
INTRA_ARTERIAL | Status: DC | PRN
  Administered 2014-10-31: 100 ug via INTRA_ARTERIAL
  Administered 2014-10-31: 200 ug via INTRA_ARTERIAL
  Administered 2014-10-31: 100 ug via INTRA_ARTERIAL

## 2014-10-31 MED ORDER — MIDAZOLAM HCL 2 MG/2ML IJ SOLN
INTRAMUSCULAR | Status: DC | PRN
  Administered 2014-10-31 (×2): 1 mg via INTRAVENOUS

## 2014-10-31 MED ORDER — BIVALIRUDIN BOLUS VIA INFUSION - CUPID
INTRAVENOUS | Status: DC | PRN
  Administered 2014-10-31: 56.85 mg via INTRAVENOUS

## 2014-10-31 MED ORDER — AMIODARONE HCL 200 MG PO TABS
100.0000 mg | ORAL_TABLET | Freq: Every day | ORAL | Status: DC
  Administered 2014-10-31: 20:00:00 100 mg via ORAL
  Filled 2014-10-31: qty 1

## 2014-10-31 MED ORDER — FENTANYL CITRATE (PF) 100 MCG/2ML IJ SOLN
INTRAMUSCULAR | Status: DC | PRN
  Administered 2014-10-31 (×2): 25 ug via INTRAVENOUS

## 2014-10-31 SURGICAL SUPPLY — 31 items
BALLN EMERGE MR PUSH 1.5X20 (BALLOONS) ×3
BALLN EUPHORA RX 2.5X20 (BALLOONS) ×3
BALLN ~~LOC~~ EMERGE MR 3.0X20 (BALLOONS) ×3
BALLN ~~LOC~~ EMERGE MR 3.0X30 (BALLOONS) ×3
BALLN ~~LOC~~ EMERGE MR 3.5X30 (BALLOONS) ×3
BALLOON EMERGE MR PUSH 1.5X20 (BALLOONS) ×1 IMPLANT
BALLOON EUPHORA RX 2.5X20 (BALLOONS) IMPLANT
BALLOON ~~LOC~~ EMERGE MR 3.0X20 (BALLOONS) ×1 IMPLANT
BALLOON ~~LOC~~ EMERGE MR 3.0X30 (BALLOONS) IMPLANT
BALLOON ~~LOC~~ EMERGE MR 3.5X30 (BALLOONS) ×1 IMPLANT
CATH INFINITI 5FR MULTPACK ANG (CATHETERS) ×3 IMPLANT
CATH MICROGUIDE FINCRSS 150 CM (MICROCATHETER) ×1 IMPLANT
CATH SITESEER 5F NTR (CATHETERS) ×2 IMPLANT
GUIDE CATH RUNWAY 6FR VL3 (CATHETERS) ×1 IMPLANT
KIT ENCORE 26 ADVANTAGE (KITS) ×2 IMPLANT
KIT HEART LEFT (KITS) ×3 IMPLANT
MICROGUIDE FINECROSS 150 CM (MICROCATHETER) ×3
PACK CARDIAC CATHETERIZATION (CUSTOM PROCEDURE TRAY) ×3 IMPLANT
SHEATH PINNACLE 5F 10CM (SHEATH) ×3 IMPLANT
SHEATH PINNACLE 6F 10CM (SHEATH) ×2 IMPLANT
STENT XIENCE ALPINE RX 3.0X38 (Permanent Stent) ×2 IMPLANT
STENT XIENCE ALPINE RX 3.5X33 (Permanent Stent) ×1 IMPLANT
SYR MEDRAD MARK V 150ML (SYRINGE) ×3 IMPLANT
TRANSDUCER W/STOPCOCK (MISCELLANEOUS) ×3 IMPLANT
TUBING CIL FLEX 10 FLL-RA (TUBING) ×1 IMPLANT
WIRE ASAHI FIELDER XT 190CM (WIRE) ×1 IMPLANT
WIRE ASAHI MIRACLEBROS-6 180CM (WIRE) ×1 IMPLANT
WIRE ASAHI MIRACLEBROS12 180CM (WIRE) ×1 IMPLANT
WIRE EMERALD 3MM-J .035X150CM (WIRE) ×3 IMPLANT
WIRE PT2 MS 185 (WIRE) ×1 IMPLANT
WIRE RUNTHROUGH .014X300CM (WIRE) ×1 IMPLANT

## 2014-10-31 NOTE — Interval H&P Note (Signed)
Cath Lab Visit (complete for each Cath Lab visit)  Clinical Evaluation Leading to the Procedure:   ACS: No.  Non-ACS:    Anginal Classification: CCS III  Anti-ischemic medical therapy: Maximal Therapy (2 or more classes of medications)  Non-Invasive Test Results: High-risk stress test findings: cardiac mortality >3%/year  Prior CABG: No previous CABG      History and Physical Interval Note:  10/31/2014 7:42 AM  Diane Fry  has presented today for surgery, with the diagnosis of * No pre-op diagnosis entered *  The various methods of treatment have been discussed with the patient and family. After consideration of risks, benefits and other options for treatment, the patient has consented to  Procedure(s): Left Heart Cath and Coronary Angiography (N/A) as a surgical intervention .  The patient's history has been reviewed, patient examined, no change in status, stable for surgery.  I have reviewed the patient's chart and labs.  Questions were answered to the patient's satisfaction.     Charolette Forward

## 2014-10-31 NOTE — H&P (Signed)
Printed H&P in the chart needs to be scanned 

## 2014-11-01 ENCOUNTER — Encounter (HOSPITAL_COMMUNITY): Payer: Self-pay | Admitting: General Practice

## 2014-11-01 DIAGNOSIS — I2510 Atherosclerotic heart disease of native coronary artery without angina pectoris: Secondary | ICD-10-CM | POA: Diagnosis not present

## 2014-11-01 DIAGNOSIS — I2582 Chronic total occlusion of coronary artery: Secondary | ICD-10-CM | POA: Diagnosis not present

## 2014-11-01 LAB — CBC
HCT: 33.5 % — ABNORMAL LOW (ref 36.0–46.0)
Hemoglobin: 10.6 g/dL — ABNORMAL LOW (ref 12.0–15.0)
MCH: 28.5 pg (ref 26.0–34.0)
MCHC: 31.6 g/dL (ref 30.0–36.0)
MCV: 90.1 fL (ref 78.0–100.0)
Platelets: 180 10*3/uL (ref 150–400)
RBC: 3.72 MIL/uL — ABNORMAL LOW (ref 3.87–5.11)
RDW: 15.1 % (ref 11.5–15.5)
WBC: 5.9 10*3/uL (ref 4.0–10.5)

## 2014-11-01 LAB — GLUCOSE, CAPILLARY
GLUCOSE-CAPILLARY: 125 mg/dL — AB (ref 65–99)
Glucose-Capillary: 142 mg/dL — ABNORMAL HIGH (ref 65–99)

## 2014-11-01 LAB — BASIC METABOLIC PANEL
Anion gap: 10 (ref 5–15)
BUN: 19 mg/dL (ref 6–20)
CHLORIDE: 97 mmol/L — AB (ref 101–111)
CO2: 34 mmol/L — AB (ref 22–32)
Calcium: 8.5 mg/dL — ABNORMAL LOW (ref 8.9–10.3)
Creatinine, Ser: 1.02 mg/dL — ABNORMAL HIGH (ref 0.44–1.00)
GFR calc Af Amer: 57 mL/min — ABNORMAL LOW (ref >60)
GFR calc non Af Amer: 49 mL/min — ABNORMAL LOW (ref >60)
Glucose, Bld: 109 mg/dL — ABNORMAL HIGH (ref 65–99)
Potassium: 3.1 mmol/L — ABNORMAL LOW (ref 3.5–5.1)
Sodium: 141 mmol/L (ref 135–145)

## 2014-11-01 MED ORDER — ISOSORBIDE MONONITRATE ER 60 MG PO TB24
60.0000 mg | ORAL_TABLET | Freq: Every day | ORAL | Status: DC
  Administered 2014-11-01: 09:00:00 60 mg via ORAL
  Filled 2014-11-01: qty 1

## 2014-11-01 MED ORDER — POTASSIUM CHLORIDE CRYS ER 20 MEQ PO TBCR
40.0000 meq | EXTENDED_RELEASE_TABLET | Freq: Once | ORAL | Status: AC
  Administered 2014-11-01: 09:00:00 40 meq via ORAL
  Filled 2014-11-01: qty 2

## 2014-11-01 MED ORDER — ALPRAZOLAM 0.25 MG PO TABS: 0.2500 mg | ORAL_TABLET | Freq: Two times a day (BID) | ORAL | Status: DC

## 2014-11-01 MED ORDER — ALPRAZOLAM 0.25 MG PO TABS
0.2500 mg | ORAL_TABLET | Freq: Two times a day (BID) | ORAL | Status: DC | PRN
  Administered 2014-11-01: 0.25 mg via ORAL
  Filled 2014-11-01: qty 1

## 2014-11-01 MED ORDER — METFORMIN HCL 500 MG PO TABS: 500.0000 mg | ORAL_TABLET | Freq: Every day | ORAL | Status: AC

## 2014-11-01 MED ORDER — METOPROLOL TARTRATE 25 MG PO TABS: 25.0000 mg | ORAL_TABLET | Freq: Two times a day (BID) | ORAL | Status: DC

## 2014-11-01 MED ORDER — NITROGLYCERIN 0.4 MG SL SUBL: 0.4000 mg | SUBLINGUAL_TABLET | SUBLINGUAL | Status: DC | PRN

## 2014-11-01 MED ORDER — LEVALBUTEROL HCL 1.25 MG/0.5ML IN NEBU
1.2500 mg | INHALATION_SOLUTION | RESPIRATORY_TRACT | Status: DC | PRN
  Administered 2014-11-01: 1.25 mg via RESPIRATORY_TRACT
  Filled 2014-11-01 (×2): qty 0.5

## 2014-11-01 MED ORDER — NITROGLYCERIN 0.4 MG SL SUBL: 0.4000 mg | SUBLINGUAL_TABLET | SUBLINGUAL | Status: AC | PRN

## 2014-11-01 MED ORDER — AZITHROMYCIN 250 MG PO TABS: ORAL_TABLET | ORAL | Status: DC

## 2014-11-01 MED FILL — Lidocaine HCl Local Preservative Free (PF) Inj 1%: INTRAMUSCULAR | Qty: 30 | Status: AC

## 2014-11-01 MED FILL — Heparin Sodium (Porcine) 2 Unit/ML in Sodium Chloride 0.9%: INTRAMUSCULAR | Qty: 1000 | Status: AC

## 2014-11-01 NOTE — Discharge Summary (Signed)
Discharge summary dictated on 11/01/2014 dictation number is [phone]

## 2014-11-01 NOTE — Discharge Instructions (Signed)
Coronary Angiogram with Stent °Coronary angiography with stent placement is a procedure to widen or open a narrow blood vessel of the heart (coronary artery). When a coronary artery becomes partially blocked, it decreases blood flow to that area. This may lead to chest pain or a heart attack (myocardial infarction). Arteries may become blocked by cholesterol buildup (plaque) in the lining or wall.  °A stent is a small piece of metal that looks like a mesh or a spring. Stent placement may be done right after a coronary angiography in which a blocked artery is found or as a treatment for a heart attack.  °LET YOUR HEALTH CARE PROVIDER KNOW ABOUT: °· Any allergies you have.   °· All medicines you are taking, including vitamins, herbs, eye drops, creams, and over-the-counter medicines.   °· Previous problems you or members of your family have had with the use of anesthetics.   °· Any blood disorders you have.   °· Previous surgeries you have had.   °· Medical conditions you have. °RISKS AND COMPLICATIONS °Generally, coronary angiography with stent is a safe procedure. However, problems can occur and include: °· Damage to the heart or its blood vessels.   °· A return of blockage.   °· Bleeding, infection, or bruising at the insertion site.   °· A collection of blood under the skin (hematoma) at the insertion site. °· Blood clot in another part of the body.   °· Kidney injury.   °· Allergic reaction to the dye or contrast used.   °· Bleeding into the abdomen (retroperitoneal bleeding). °BEFORE THE PROCEDURE °· Do not eat or drink anything after midnight on the night before the procedure or as directed by your health care provider.  °· Ask your health care provider about changing or stopping your regular medicines. This is especially important if you are taking diabetes medicines or blood thinners. °· Your health care provider will make sure you understand the procedure as well as the risks and potential problems  associated with the procedure.   °PROCEDURE °· You may be given a medicine to help you relax before and during the procedure (sedative). This medicine will be given through an IV tube that is put into one of your veins.   °· The area where the catheter will be inserted will be shaved and cleaned. This is usually done in the groin but may be done in the fold of your arm (near your elbow) or in the wrist.    °· A medicine will be given to numb the area where the catheter will be inserted (local anesthetic).   °· The catheter will be inserted into an artery using a guide wire. A type of X-ray (fluoroscopy) will be used to help guide the catheter to the opening of the blocked artery.   °· A dye will then be injected into the catheter, and X-rays will be taken. The dye will help to show where any narrowing or blockages are located in the heart arteries.   °· A tiny wire will be guided to the blocked spot, and a balloon will be inflated to make the artery wider. The stent will be expanded and will crush the plaque into the wall of the vessel. The stent will hold the area open like a scaffolding and improve the blood flow.   °· Sometimes the artery may be made wider using a laser or other tools to remove plaque.   °· When the blood flow is better, the catheter will be removed. The lining of the artery will grow over the stent, which stays where it was placed.   °  AFTER THE PROCEDURE °· If the procedure is done through the leg, you will be kept in bed lying flat for about 6 hours. You will be instructed to not bend or cross your legs.   °· The insertion site will be checked frequently.   °· The pulse in your feet or wrist will be checked frequently.   °· Additional blood tests, X-rays, and electrocardiography may be done. °Document Released: 12/12/2002 Document Revised: 10/22/2013 Document Reviewed: 12/14/2012 °[employer]® Patient Information ©2015 [employer]. This information is not intended to replace advice given to you  by your health care provider. Make sure you discuss any questions you have with your health care provider. ° °

## 2014-11-01 NOTE — Progress Notes (Signed)
CARDIAC REHAB PHASE I   PRE:  Rate/Rhythm: 56 SR  BP:  Supine: 177/65  Sitting:   Standing:    SaO2: 95 3.5L, 93 2L, 94RA  MODE:  Ambulation: 340 ft   POST:  Rate/Rhythm: 73 SR  BP:  Supine:   Sitting: 168/62  Standing:    SaO2: 92 2L  SATURATION QUALIFICATIONS: (This note is used to comply with regulatory documentation for home oxygen)  Patient Saturations on Room Air at Rest = 94%  Patient Saturations on Room Air while Ambulating = 84%  Patient Saturations on 2 Liters of oxygen while Ambulating = 93%  Please briefly explain why patient needs home oxygen:  Pt. walked 340 ft with walker without CP, pt. Did experience slight SOB. Pt desaturated to 84% while walking. Encouraged purse lip breathing. Placed pt. On 2L of O2 and increased to 93%. Did take 2 standing rest breaks and pt stopped walking with walker after 1st rest break, pt. Was steady and comfortable without walker. Returned to recliner with LE elevated. Educated was completed on [place], diabetes diet, exercise guidelines and stent card. Pt. Declined CRPII. Pt was less anxious and stated that she felt better after walk. [id number]  Latrell Potempa D Rodderick Holtzer,MS,ACSM-RCEP 11/01/2014 9:51 AM

## 2014-11-02 NOTE — Discharge Summary (Signed)
[name], [name]                 ACCOUNT NO.:  [id number]  MEDICAL RECORD NO.:  [id number]  LOCATION:  6C02C                        FACILITY:  [facility]  PHYSICIAN:  Ilamae Geng N. Terrence Dupont, M.D. DATE OF BIRTH:  1929-04-19  DATE OF ADMISSION:  10/31/2014 DATE OF DISCHARGE:  11/01/2014                              DISCHARGE SUMMARY   ADMITTING DIAGNOSES: 1. Exertional dyspnea, probably anginal equivalent, positive nuclear     stress test, rule out progression of coronary artery     disease/restenosis. 2. Coronary artery disease, history of non-Q-wave myocardial     infarction in 2004, requiring percutaneous transluminal coronary     angioplasty, stenting to left anterior descending coronary artery     x2. 3. Hypertension. 4. Hyperlipidemia. 5. Hypothyroidism. 6. Morbid obesity. 7. Osteoarthritis. 8. Status post paroxysmal atrial fibrillation.  FINAL DIAGNOSES: 1. Exertional dyspnea, probably anginal equivalent.  Positive nuclear     stress test status post cardiac cath/percutaneous transluminal     coronary angioplasty stenting x2 to chronically occluded left     anterior descending coronary artery with excellent angiographic     results. 2. Coronary artery disease, history of non-Q-wave myocardial     infarction in the past in 2004, requiring percutaneous transluminal     coronary angioplasty stenting to left anterior descending coronary     artery x2. 3. Hypertension. 4. Hyperlipidemia. 5. Hypothyroidism. 6. Morbid obesity. 7. History of paroxysmal atrial fibrillation. 8. Osteoarthritis.  MEDICATIONS:  Discharge home medications are: 1. Nitrostat 0.4 mg sublingual, use as directed. 2. Amiodarone 200 mg 1/2 tablet daily. 3. Aspirin 81 mg 1 tablet daily. 4. Atorvastatin 40 mg daily. 5. Clopidogrel 75 mg daily. 6. Mucinex 1200 mg twice daily. 7. Zithromax 500 mg today and then 250 mg everyday for 4 more days. 8. Hydrochlorothiazide 12.5 mg 1 tablet daily. 9. Imdur 30 mg  daily. 10.Xopenex inhaler as before. 11.Synthroid 50 mcg daily. 12.Metformin 500 mg daily, starting from tomorrow. 13.Polyethylene glycol, propylene glycol eye drops as before. 14.Xanax dose has been reduced to 0.25 mg twice daily. 15.Metoprolol has been reduced to 25 mg twice daily. 16.The patient has been advised to stop ampicillin, furosemide,     Vicodin, hydrocortisone, and Xarelto.  DIET:  Low-salt, low-cholesterol 1800 calories ADA diet.  Monitor blood pressure and blood sugar daily and chart.  Post cardiac cath/PTCA stent instructions have been given.  The patient will be scheduled for phase 2 cardiac rehab as an outpatient.  CONDITION AT DISCHARGE:  Stable.  BRIEF HISTORY AND HOSPITAL COURSE:  [name] is an 81 year old female with past medical history significant for coronary artery disease, history of non-Q-wave myocardial infarction in remote past, status post PTCA stenting to proximal and mid LAD, requiring 3.0 x 33 mm long Cypher drug-eluting stent in proximal LAD and 2.5 x 28 mm long Cypher drug- eluting stent in mid LAD in March 2004, subsequently had restenosis in proximal stent, requiring PTCA, hypertension, hypercholesteremia, history of paroxysmal atrial fibrillation, depression, chronic kidney disease, anemia, hypothyroidism, obesity, was seen in my office, complaining of shortness of breath with minimal exertion associated with feeling tired, fatigued, and no energy.  The patient denies any chest pain, nausea,  vomiting, diaphoresis.  Denies palpitation, lightheadedness, or syncope.  The patient subsequently underwent Lexiscan Myoview which showed large area of reversible ischemia in anterolateral wall with EF of 75%.  Due to exertional dyspnea probably angina equivalent and strongly positive nuclear stress test, discussed with the patient regarding left cardiac cath, possible PTCA stenting, its risks and benefits and consents for PCI.  PHYSICAL EXAMINATION:   GENERAL:  She was alert, awake, oriented x3. VITAL SIGNS:  Blood pressure was 164/77, pulse was 59. HEENT:  Conjunctivae pink. NECK:  Supple.  No JVD.  No bruit. LUNGS:  Clear to auscultation without rhonchi or rales. CARDIOVASCULAR:  S1, S2 normal.  She was bradycardic.  There was soft systolic murmur.  No S3, gallop. ABDOMEN:  Soft.  Bowel sounds present.  Nontender. EXTREMITIES:  There is no clubbing, cyanosis, or edema.  LABORATORY DATA:  Postprocedure sodium was 141, potassium 3.1 which has been replaced, BUN 19, creatinine 1.02 which has come down from 1.26. Her blood sugar remains between 98 to 125.  EKG this morning showed sinus bradycardia with first-degree AV block and nonspecific T-wave changes.  BRIEF HOSPITAL COURSE:  The patient was a.m. admit and underwent left cardiac cath with selective left and right coronary angiography, left ventriculography, and PTCA stenting to chronically occluded LAD with excellent angiographic results.  The patient tolerated the procedure well.  There were no complications.  Postprocedure, the patient did not have any anginal chest pain.  Phase 1 cardiac rehab was called.  The patient is ambulating in hallway and room without any problems.  Her groin is stable with no evidence of hematoma or bruit. The patient will be discharged home on above medications and will be followed up in my office in 1 week.     [name]. Terrence Dupont, M.D.     MNH/MEDQ  D:  11/01/2014  T:  11/02/2014  Job:  [id number]  cc:   [name]. Terrence Dupont, M.D.

## 2014-11-11 ENCOUNTER — Ambulatory Visit
Admission: RE | Admit: 2014-11-11 | Discharge: 2014-11-11 | Disposition: A | Discharge status: Home / Self Care | Payer: Medicare Other | Source: Ambulatory Visit | Attending: Internal Medicine | Admitting: Internal Medicine

## 2014-11-11 ENCOUNTER — Other Ambulatory Visit: Payer: Self-pay | Admitting: Internal Medicine

## 2014-11-11 DIAGNOSIS — R05 Cough: Secondary | ICD-10-CM

## 2014-11-11 DIAGNOSIS — R059 Cough, unspecified: Secondary | ICD-10-CM

## 2014-11-24 ENCOUNTER — Other Ambulatory Visit (HOSPITAL_COMMUNITY): Payer: Self-pay

## 2014-11-24 ENCOUNTER — Encounter (HOSPITAL_COMMUNITY): Payer: Self-pay | Admitting: *Deleted

## 2014-11-24 ENCOUNTER — Inpatient Hospital Stay (HOSPITAL_COMMUNITY)
Admission: EM | Admit: 2014-11-24 | Discharge: 2014-11-28 | DRG: 293 | Disposition: A | Discharge status: Home / Self Care | Payer: Medicare Other | Attending: Cardiology | Admitting: Cardiology

## 2014-11-24 ENCOUNTER — Emergency Department (HOSPITAL_COMMUNITY): Payer: Medicare Other

## 2014-11-24 DIAGNOSIS — F419 Anxiety disorder, unspecified: Secondary | ICD-10-CM | POA: Diagnosis present

## 2014-11-24 DIAGNOSIS — E785 Hyperlipidemia, unspecified: Secondary | ICD-10-CM | POA: Diagnosis present

## 2014-11-24 DIAGNOSIS — I480 Paroxysmal atrial fibrillation: Secondary | ICD-10-CM | POA: Diagnosis present

## 2014-11-24 DIAGNOSIS — E119 Type 2 diabetes mellitus without complications: Secondary | ICD-10-CM | POA: Diagnosis present

## 2014-11-24 DIAGNOSIS — I2510 Atherosclerotic heart disease of native coronary artery without angina pectoris: Secondary | ICD-10-CM | POA: Diagnosis present

## 2014-11-24 DIAGNOSIS — Z7902 Long term (current) use of antithrombotics/antiplatelets: Secondary | ICD-10-CM | POA: Diagnosis not present

## 2014-11-24 DIAGNOSIS — Z955 Presence of coronary angioplasty implant and graft: Secondary | ICD-10-CM

## 2014-11-24 DIAGNOSIS — I10 Essential (primary) hypertension: Secondary | ICD-10-CM | POA: Diagnosis present

## 2014-11-24 DIAGNOSIS — E039 Hypothyroidism, unspecified: Secondary | ICD-10-CM | POA: Diagnosis present

## 2014-11-24 DIAGNOSIS — R0600 Dyspnea, unspecified: Secondary | ICD-10-CM | POA: Diagnosis present

## 2014-11-24 DIAGNOSIS — R0902 Hypoxemia: Secondary | ICD-10-CM

## 2014-11-24 DIAGNOSIS — I252 Old myocardial infarction: Secondary | ICD-10-CM | POA: Diagnosis not present

## 2014-11-24 DIAGNOSIS — I5023 Acute on chronic systolic (congestive) heart failure: Principal | ICD-10-CM | POA: Diagnosis present

## 2014-11-24 DIAGNOSIS — I509 Heart failure, unspecified: Secondary | ICD-10-CM

## 2014-11-24 DIAGNOSIS — Z6829 Body mass index (BMI) 29.0-29.9, adult: Secondary | ICD-10-CM | POA: Diagnosis not present

## 2014-11-24 DIAGNOSIS — E876 Hypokalemia: Secondary | ICD-10-CM | POA: Diagnosis present

## 2014-11-24 DIAGNOSIS — Z79899 Other long term (current) drug therapy: Secondary | ICD-10-CM

## 2014-11-24 DIAGNOSIS — R0603 Acute respiratory distress: Secondary | ICD-10-CM

## 2014-11-24 DIAGNOSIS — Z7982 Long term (current) use of aspirin: Secondary | ICD-10-CM

## 2014-11-24 LAB — CBC WITH DIFFERENTIAL/PLATELET
BASOS ABS: 0 10*3/uL (ref 0.0–0.1)
Basophils Relative: 0 % (ref 0–1)
EOS ABS: 0.2 10*3/uL (ref 0.0–0.7)
EOS PCT: 3 % (ref 0–5)
HCT: 36.2 % (ref 36.0–46.0)
HEMOGLOBIN: 11.6 g/dL — AB (ref 12.0–15.0)
LYMPHS ABS: 1.3 10*3/uL (ref 0.7–4.0)
Lymphocytes Relative: 17 % (ref 12–46)
MCH: 28.7 pg (ref 26.0–34.0)
MCHC: 32 g/dL (ref 30.0–36.0)
MCV: 89.6 fL (ref 78.0–100.0)
Monocytes Absolute: 0.6 10*3/uL (ref 0.1–1.0)
Monocytes Relative: 8 % (ref 3–12)
NEUTROS ABS: 5.6 10*3/uL (ref 1.7–7.7)
NEUTROS PCT: 72 % (ref 43–77)
Platelets: 272 10*3/uL (ref 150–400)
RBC: 4.04 MIL/uL (ref 3.87–5.11)
RDW: 15.2 % (ref 11.5–15.5)
WBC: 7.7 10*3/uL (ref 4.0–10.5)

## 2014-11-24 LAB — COMPREHENSIVE METABOLIC PANEL
ALT: 20 U/L (ref 14–54)
ANION GAP: 9 (ref 5–15)
AST: 23 U/L (ref 15–41)
Albumin: 3.7 g/dL (ref 3.5–5.0)
Alkaline Phosphatase: 75 U/L (ref 38–126)
BILIRUBIN TOTAL: 1.1 mg/dL (ref 0.3–1.2)
BUN: 11 mg/dL (ref 6–20)
CALCIUM: 9.3 mg/dL (ref 8.9–10.3)
CO2: 33 mmol/L — AB (ref 22–32)
Chloride: 99 mmol/L — ABNORMAL LOW (ref 101–111)
Creatinine, Ser: 1.06 mg/dL — ABNORMAL HIGH (ref 0.44–1.00)
GFR calc Af Amer: 54 mL/min — ABNORMAL LOW (ref >60)
GFR, EST NON AFRICAN AMERICAN: 47 mL/min — AB (ref >60)
GLUCOSE: 140 mg/dL — AB (ref 65–99)
Potassium: 3.8 mmol/L (ref 3.5–5.1)
Sodium: 141 mmol/L (ref 135–145)
TOTAL PROTEIN: 7.7 g/dL (ref 6.5–8.1)

## 2014-11-24 LAB — URINALYSIS, ROUTINE W REFLEX MICROSCOPIC
Bilirubin Urine: NEGATIVE
Glucose, UA: NEGATIVE mg/dL
Hgb urine dipstick: NEGATIVE
KETONES UR: NEGATIVE mg/dL
LEUKOCYTES UA: NEGATIVE
Nitrite: NEGATIVE
Protein, ur: NEGATIVE mg/dL
Specific Gravity, Urine: 1.005 (ref 1.005–1.030)
Urobilinogen, UA: 0.2 mg/dL (ref 0.0–1.0)
pH: 7.5 (ref 5.0–8.0)

## 2014-11-24 LAB — TROPONIN I: Troponin I: <0.03 ng/mL (ref <0.031)

## 2014-11-24 LAB — I-STAT TROPONIN, ED: TROPONIN I, POC: 0.02 ng/mL (ref 0.00–0.08)

## 2014-11-24 LAB — MRSA PCR SCREENING: MRSA by PCR: NEGATIVE

## 2014-11-24 LAB — BRAIN NATRIURETIC PEPTIDE: B Natriuretic Peptide: 608.3 pg/mL — ABNORMAL HIGH (ref 0.0–100.0)

## 2014-11-24 LAB — I-STAT CG4 LACTIC ACID, ED: Lactic Acid, Venous: 1.22 mmol/L (ref 0.5–2.0)

## 2014-11-24 MED ORDER — VANCOMYCIN HCL IN DEXTROSE 1-5 GM/200ML-% IV SOLN: 1000.0000 mg | Freq: Once | INTRAVENOUS | Status: DC

## 2014-11-24 MED ORDER — FUROSEMIDE 10 MG/ML IJ SOLN
40.0000 mg | Freq: Two times a day (BID) | INTRAMUSCULAR | Status: DC
  Administered 2014-11-24 – 2014-11-27 (×7): 40 mg via INTRAVENOUS
  Filled 2014-11-24 (×7): qty 4

## 2014-11-24 MED ORDER — ISOSORBIDE MONONITRATE ER 30 MG PO TB24
30.0000 mg | ORAL_TABLET | Freq: Every morning | ORAL | Status: DC
  Administered 2014-11-24 – 2014-11-28 (×5): 30 mg via ORAL
  Filled 2014-11-24 (×5): qty 1

## 2014-11-24 MED ORDER — METOPROLOL TARTRATE 25 MG PO TABS
25.0000 mg | ORAL_TABLET | Freq: Two times a day (BID) | ORAL | Status: DC
  Administered 2014-11-24 – 2014-11-28 (×7): 25 mg via ORAL
  Filled 2014-11-24 (×10): qty 1

## 2014-11-24 MED ORDER — HEPARIN SODIUM (PORCINE) 5000 UNIT/ML IJ SOLN
5000.0000 [IU] | Freq: Three times a day (TID) | INTRAMUSCULAR | Status: DC
  Administered 2014-11-24 – 2014-11-28 (×12): 5000 [IU] via SUBCUTANEOUS
  Filled 2014-11-24 (×16): qty 1

## 2014-11-24 MED ORDER — GUAIFENESIN ER 600 MG PO TB12
1200.0000 mg | ORAL_TABLET | Freq: Two times a day (BID) | ORAL | Status: DC
  Administered 2014-11-24 – 2014-11-28 (×8): 1200 mg via ORAL
  Filled 2014-11-24 (×9): qty 2

## 2014-11-24 MED ORDER — ALPRAZOLAM 0.25 MG PO TABS
0.2500 mg | ORAL_TABLET | Freq: Two times a day (BID) | ORAL | Status: DC
  Administered 2014-11-24 – 2014-11-28 (×9): 0.25 mg via ORAL
  Filled 2014-11-24 (×9): qty 1

## 2014-11-24 MED ORDER — SODIUM CHLORIDE 0.9 % IV SOLN
250.0000 mL | INTRAVENOUS | Status: DC | PRN
Start: 2014-11-24 — End: 2014-11-28

## 2014-11-24 MED ORDER — HYDROCODONE-ACETAMINOPHEN 5-325 MG PO TABS
1.0000 | ORAL_TABLET | Freq: Two times a day (BID) | ORAL | Status: DC | PRN
Start: 2014-11-24 — End: 2014-11-26
  Administered 2014-11-24 – 2014-11-25 (×2): 1 via ORAL
  Filled 2014-11-24 (×3): qty 1

## 2014-11-24 MED ORDER — FUROSEMIDE 10 MG/ML IJ SOLN
40.0000 mg | Freq: Once | INTRAMUSCULAR | Status: AC
  Administered 2014-11-24: 40 mg via INTRAVENOUS
  Filled 2014-11-24: qty 4

## 2014-11-24 MED ORDER — METFORMIN HCL 500 MG PO TABS
500.0000 mg | ORAL_TABLET | Freq: Every day | ORAL | Status: DC
  Administered 2014-11-25 – 2014-11-28 (×4): 500 mg via ORAL
  Filled 2014-11-24 (×4): qty 1

## 2014-11-24 MED ORDER — LEVOTHYROXINE SODIUM 50 MCG PO TABS
50.0000 ug | ORAL_TABLET | Freq: Every day | ORAL | Status: DC
  Administered 2014-11-25 – 2014-11-27 (×3): 50 ug via ORAL
  Filled 2014-11-24 (×4): qty 1

## 2014-11-24 MED ORDER — NITROGLYCERIN 0.4 MG SL SUBL: 0.4000 mg | SUBLINGUAL_TABLET | SUBLINGUAL | Status: DC | PRN

## 2014-11-24 MED ORDER — ASPIRIN EC 81 MG PO TBEC
162.0000 mg | DELAYED_RELEASE_TABLET | Freq: Every day | ORAL | Status: DC
  Administered 2014-11-24 – 2014-11-28 (×5): 162 mg via ORAL
  Filled 2014-11-24 (×5): qty 2

## 2014-11-24 MED ORDER — AMIODARONE HCL 100 MG PO TABS
100.0000 mg | ORAL_TABLET | Freq: Every day | ORAL | Status: DC
  Administered 2014-11-24 – 2014-11-28 (×5): 100 mg via ORAL
  Filled 2014-11-24 (×5): qty 1

## 2014-11-24 MED ORDER — SODIUM CHLORIDE 0.9 % IJ SOLN: 3.0000 mL | INTRAMUSCULAR | Status: DC | PRN

## 2014-11-24 MED ORDER — NITROGLYCERIN IN D5W 200-5 MCG/ML-% IV SOLN
10.0000 ug/min | INTRAVENOUS | Status: DC
  Administered 2014-11-24: 10 ug/min via INTRAVENOUS
  Filled 2014-11-24: qty 250

## 2014-11-24 MED ORDER — IPRATROPIUM BROMIDE 0.02 % IN SOLN
0.5000 mg | Freq: Once | RESPIRATORY_TRACT | Status: AC
  Administered 2014-11-24: 0.5 mg via RESPIRATORY_TRACT
  Filled 2014-11-24: qty 2.5

## 2014-11-24 MED ORDER — ATORVASTATIN CALCIUM 40 MG PO TABS
40.0000 mg | ORAL_TABLET | Freq: Every day | ORAL | Status: DC
  Administered 2014-11-24 – 2014-11-28 (×5): 40 mg via ORAL
  Filled 2014-11-24 (×5): qty 1

## 2014-11-24 MED ORDER — ACETAMINOPHEN 325 MG PO TABS
650.0000 mg | ORAL_TABLET | ORAL | Status: DC | PRN
  Administered 2014-11-25 (×2): 650 mg via ORAL
  Filled 2014-11-24 (×2): qty 2

## 2014-11-24 MED ORDER — ALBUTEROL SULFATE (2.5 MG/3ML) 0.083% IN NEBU
5.0000 mg | INHALATION_SOLUTION | Freq: Once | RESPIRATORY_TRACT | Status: AC
  Administered 2014-11-24: 5 mg via RESPIRATORY_TRACT
  Filled 2014-11-24: qty 6

## 2014-11-24 MED ORDER — DEXTROSE 5 % IV SOLN
2.0000 g | Freq: Once | INTRAVENOUS | Status: DC
  Filled 2014-11-24: qty 2

## 2014-11-24 MED ORDER — ONDANSETRON HCL 4 MG/2ML IJ SOLN: 4.0000 mg | Freq: Four times a day (QID) | INTRAMUSCULAR | Status: DC | PRN

## 2014-11-24 MED ORDER — VANCOMYCIN HCL IN DEXTROSE 750-5 MG/150ML-% IV SOLN: 750.0000 mg | Freq: Two times a day (BID) | INTRAVENOUS | Status: DC

## 2014-11-24 MED ORDER — SODIUM CHLORIDE 0.9 % IJ SOLN
3.0000 mL | Freq: Two times a day (BID) | INTRAMUSCULAR | Status: DC
  Administered 2014-11-25 (×2): 3 mL via INTRAVENOUS
  Administered 2014-11-26: 09:00:00 via INTRAVENOUS
  Administered 2014-11-26 – 2014-11-28 (×4): 3 mL via INTRAVENOUS

## 2014-11-24 MED ORDER — CLOPIDOGREL BISULFATE 75 MG PO TABS
75.0000 mg | ORAL_TABLET | Freq: Every day | ORAL | Status: DC
  Administered 2014-11-24 – 2014-11-28 (×5): 75 mg via ORAL
  Filled 2014-11-24 (×5): qty 1

## 2014-11-24 MED ORDER — ASPIRIN 81 MG PO CHEW
CHEWABLE_TABLET | ORAL | Status: AC
Start: 2014-11-24 — End: 2014-11-24
  Filled 2014-11-24: qty 2

## 2014-11-24 NOTE — ED Notes (Signed)
RT at bedside placing bi-pap.  Pt continues to deny pain.  Pt hypertensive.  PA notified.

## 2014-11-24 NOTE — Progress Notes (Signed)
Patient placed on Bipap per MD order.  Currently tolerating well.  Will continue to monitor.  

## 2014-11-24 NOTE — ED Notes (Signed)
Lab called Re pt bnp and cmp.

## 2014-11-24 NOTE — H&P (Signed)
Referring Physician:  BREON DISS is an 81 y.o. female.                       Chief Complaint: Shortness of breath  HPI: 81 year old female has shortness of breath progressively worsening for the past one week. Last month she had PTCA stenting of LAD x 2. Past medical history is significant for hypertension, hyperlipidemia, hypothyroidism,Obesity and Paroxysmal atrial fibrillation.   Past Medical History  Diagnosis Date  . Hypertension   . Coronary artery disease   . Diabetes mellitus without complication   . Pneumonia 2015  . Anxiety       Past Surgical History  Procedure Laterality Date  . Craniotomy N/A 09/07/2013    Procedure: TRANSSPHENOIDAL RESECTION OF PITUITARY MACROADENOMA WITH ABDOMINAL FAT GRAPH;  Surgeon: Erline Levine, MD;  Location: [facility];  Service: Neurosurgery;  Laterality: N/A;  CRANIOTOMY HYPOPHYSECTOMY TRANSNASAL APPROACH  . Transnasal approach N/A 09/07/2013    Procedure: TRANSNASAL APPROACH;  Surgeon: Izora Gala, MD;  Location: [facility];  Service: ENT;  Laterality: N/A;  . Cardiac catheterization N/A 10/31/2014    Procedure: Left Heart Cath and Coronary Angiography;  Surgeon: Charolette Forward, MD;  Location: [facility];  Service: Cardiovascular;  Laterality: N/A;  . Percutaneous coronary stent intervention (pci-s)  10/31/2014    Procedure: Percutaneous Coronary Stent Intervention (Pci-S);  Surgeon: Charolette Forward, MD;  Location: [facility];  Service: Cardiovascular;;  . Coronary angioplasty with stent placement  10/31/2014    LAD   . Cataract extraction      No family history on file. Social History:  reports that she has never smoked. She has never used smokeless tobacco. She reports that she does not drink alcohol or use illicit drugs.  Allergies: No Known Allergies   (Not in a hospital admission)  Results for orders placed or performed during the hospital encounter of 11/24/14 (from the past 48 hour(s))  CBC with  Differential/Platelet     Status: Abnormal   Collection Time: 11/24/14 10:40 AM  Result Value Ref Range   WBC 7.7 4.0 - 10.5 K/uL   RBC 4.04 3.87 - 5.11 MIL/uL   Hemoglobin 11.6 (L) 12.0 - 15.0 g/dL   HCT 36.2 36.0 - 46.0 %   MCV 89.6 78.0 - 100.0 fL   MCH 28.7 26.0 - 34.0 pg   MCHC 32.0 30.0 - 36.0 g/dL   RDW 15.2 11.5 - 15.5 %   Platelets 272 150 - 400 K/uL   Neutrophils Relative % 72 43 - 77 %   Neutro Abs 5.6 1.7 - 7.7 K/uL   Lymphocytes Relative 17 12 - 46 %   Lymphs Abs 1.3 0.7 - 4.0 K/uL   Monocytes Relative 8 3 - 12 %   Monocytes Absolute 0.6 0.1 - 1.0 K/uL   Eosinophils Relative 3 0 - 5 %   Eosinophils Absolute 0.2 0.0 - 0.7 K/uL   Basophils Relative 0 0 - 1 %   Basophils Absolute 0.0 0.0 - 0.1 K/uL  Comprehensive metabolic panel     Status: Abnormal   Collection Time: 11/24/14 10:40 AM  Result Value Ref Range   Sodium 141 135 - 145 mmol/L   Potassium 3.8 3.5 - 5.1 mmol/L   Chloride 99 (L) 101 - 111 mmol/L   CO2 33 (H) 22 - 32 mmol/L   Glucose, Bld 140 (H) 65 - 99 mg/dL   BUN 11 6 - 20  mg/dL   Creatinine, Ser 1.06 (H) 0.44 - 1.00 mg/dL   Calcium 9.3 8.9 - 10.3 mg/dL   Total Protein 7.7 6.5 - 8.1 g/dL   Albumin 3.7 3.5 - 5.0 g/dL   AST 23 15 - 41 U/L   ALT 20 14 - 54 U/L   Alkaline Phosphatase 75 38 - 126 U/L   Total Bilirubin 1.1 0.3 - 1.2 mg/dL   GFR calc non Af Amer 47 (L) >60 mL/min   GFR calc Af Amer 54 (L) >60 mL/min    Comment: (NOTE) The eGFR has been calculated using the CKD EPI equation. This calculation has not been validated in all clinical situations. eGFR's persistently <60 mL/min signify possible Chronic Kidney Disease.    Anion gap 9 5 - 15  Brain natriuretic peptide     Status: Abnormal   Collection Time: 11/24/14 10:40 AM  Result Value Ref Range   B Natriuretic Peptide 608.3 (H) 0.0 - 100.0 pg/mL  I-stat troponin, ED  (not at [facility], [facility])     Status: None   Collection Time: 11/24/14 10:57 AM  Result Value Ref Range   Troponin i, poc 0.02  0.00 - 0.08 ng/mL   Comment 3            Comment: Due to the release kinetics of cTnI, a negative result within the first hours of the onset of symptoms does not rule out myocardial infarction with certainty. If myocardial infarction is still suspected, repeat the test at appropriate intervals.   I-Stat CG4 Lactic Acid, ED     Status: None   Collection Time: 11/24/14 10:59 AM  Result Value Ref Range   Lactic Acid, Venous 1.22 0.5 - 2.0 mmol/L   Dg Chest Port 1 View  11/24/2014   CLINICAL DATA:  Short of breath, pneumonia  EXAM: PORTABLE CHEST - 1 VIEW  COMPARISON:  Radiograph 11/11/2014  FINDINGS: Normal cardiac silhouette. There is bibasilar airspace opacities. Upper lungs are clear. No pneumothorax.  IMPRESSION: Bibasilar airspace disease suggests pulmonary edema.   Electronically Signed   By: Suzy Bouchard M.D.   On: 11/24/2014 11:29    Review Of Systems Constitutional: Denies fever, chills, diaphoresis, appetite change and fatigue.  HEENT: Congestion, Denies eye pain, ear pain, congestion, sore throat, rhinorrhea, sneezing, mouth sores, trouble swallowing, neck pain,  Respiratory: Dyspnea on exertion, productive cough, chest congestion, Denieschest tightness,and wheezing.  Cardiovascular: Denies chest pain, palpitations and leg swelling.  Gastrointestinal: Denies nausea, vomiting, abdominal pain, diarrhea, blood in stool  Genitourinary: Denies dysuria, hematuria, flank pain and difficulty urinating.  Endocrine: Denies: hot or cold intolerance, , polyuria, polydipsia. Musculoskeletal: Denies myalgias, back pain, joint pain Skin: Denies rash and wound.  Neurological: Denies dizziness, seizures, syncope, weakness, light-headedness, numbness and headaches.  Psychiatric/Behavioral: Denies confusion  Blood pressure 139/59, pulse 60, temperature 98.6 F (37 C), temperature source Rectal, resp. rate 18, SpO2 98 %. Physical Exam: HEENT: Normocephalic, atraumatic, Conjunctivae  pink. Sclera-white. PERL. NECK: Supple. + JVD. No bruit. LUNGS: Crackles  to auscultation without rhonchi or rales. CARDIOVASCULAR: S1, S2 normal. She was bradycardic. There was soft systolic murmur. . ABDOMEN: Soft. Bowel sounds present. Nontender. EXTREMITIES: There is no clubbing, cyanosis,. Trace edema present. CNS: Grossly intact cranial nerves. Moves all 4 extremities.  Assessment/Plan Acute on chronic left heart systolic failure. CAD S/P angioplasty/stenting of LAD. Hypertension Type II DM S/P Pituitary macroadenoma  Admit/IV lasix/Oxygen-Bipap/IV NTG.  Birdie Riddle, MD  11/24/2014, 1:35 PM

## 2014-11-24 NOTE — Progress Notes (Signed)
Patient transported to 2H28 on nasal cannula.  Bipap was taken with patient for standby in case patient enters respiratory distress again.  Unit RT aware.  Will continue to monitor.

## 2014-11-24 NOTE — ED Notes (Signed)
MD at bedside. 

## 2014-11-24 NOTE — Progress Notes (Signed)
Patient taken off of Bipap and placed on 4L nasal cannula.  Currently tolerating well.  Will continue to monitor. 

## 2014-11-24 NOTE — ED Notes (Signed)
Called pharmacy for antibiotics 

## 2014-11-24 NOTE — ED Provider Notes (Signed)
CSN: [id number]     Arrival date & time 11/24/14  0929 History   First MD Initiated Contact with Patient 11/24/14 0957     Chief Complaint  Patient presents with  . Shortness of Breath     (Consider location/radiation/quality/duration/timing/severity/associated sxs/prior Treatment) Patient is a 81 y.o. female presenting with shortness of breath. The history is provided by the patient. No language interpreter was used.  Shortness of Breath Associated symptoms: no chest pain and no fever   Stormi ZADA HASER is an 81 y/o F with PMHx of HTN, CAD, DM, pneumonia, anxiety presenting to the ED with shortness of breath that has been ongoing for one week. Patient reported that she has been short of breath at rest and with ambulation. Patient reported that since last night the shortness of breath has gotten worse. Reported that she was recently in the hospital for stent placement x 2 and was in cardiac rehab. Patient reported that she was having a cough for the past couple of days, was seen by her PCP who started the patient on Azithromycin that was completed 2 days ago. Patient reported that she was diagnosed with bronchitis at this time. Patient reported that she has been having mild cough for the past couple of days, has resolved 2 days ago. Patient reported that she's never had to use oxygen before, reported that this is new. Denied chest pain, leg swelling, fever, chills, recent travel, hemoptysis. PCP Dr. Mancel Bale Cardiologist Dr. Terrence Dupont   Past Medical History  Diagnosis Date  . Hypertension   . Coronary artery disease   . Diabetes mellitus without complication   . Pneumonia 2015  . Anxiety    Past Surgical History  Procedure Laterality Date  . Craniotomy N/A 09/07/2013    Procedure: TRANSSPHENOIDAL RESECTION OF PITUITARY MACROADENOMA WITH ABDOMINAL FAT GRAPH;  Surgeon: Erline Levine, MD;  Location: [facility];  Service: Neurosurgery;  Laterality: N/A;  CRANIOTOMY HYPOPHYSECTOMY TRANSNASAL APPROACH   . Transnasal approach N/A 09/07/2013    Procedure: TRANSNASAL APPROACH;  Surgeon: Izora Gala, MD;  Location: [facility];  Service: ENT;  Laterality: N/A;  . Cardiac catheterization N/A 10/31/2014    Procedure: Left Heart Cath and Coronary Angiography;  Surgeon: Charolette Forward, MD;  Location: [facility];  Service: Cardiovascular;  Laterality: N/A;  . Percutaneous coronary stent intervention (pci-s)  10/31/2014    Procedure: Percutaneous Coronary Stent Intervention (Pci-S);  Surgeon: Charolette Forward, MD;  Location: [place] [facility];  Service: Cardiovascular;;  . Coronary angioplasty with stent placement  10/31/2014    LAD   . Cataract extraction     No family history on file. History  Substance Use Topics  . Smoking status: Never Smoker   . Smokeless tobacco: Never Used  . Alcohol Use: No   OB History    No data available     Review of Systems  Constitutional: Negative for fever and chills.  Respiratory: Positive for shortness of breath.   Cardiovascular: Negative for chest pain and leg swelling.      Allergies  Review of patient's allergies indicates no known allergies.  Home Medications   Prior to Admission medications   Medication Sig Start Date End Date Taking? Authorizing Provider  ALPRAZolam (XANAX) 0.25 MG tablet Take 1 tablet (0.25 mg total) by mouth 2 (two) times daily. 11/01/14  Yes Charolette Forward, MD  amiodarone (PACERONE) 200 MG tablet Take 100 mg by mouth daily.   Yes Historical Provider, MD  aspirin EC 81  MG tablet Take 162 mg by mouth daily.    Yes Historical Provider, MD  atorvastatin (LIPITOR) 80 MG tablet Take 40 mg by mouth daily. 08/21/13  Yes Historical Provider, MD  clopidogrel (PLAVIX) 75 MG tablet Take 75 mg by mouth daily.   Yes Historical Provider, MD  furosemide (LASIX) 20 MG tablet Take 20 mg by mouth daily.   Yes Historical Provider, MD  guaiFENesin (MUCINEX) 600 MG 12 hr tablet Take 2 tablets (1,200 mg total) by mouth 2 (two) times daily.  05/15/14  Yes Bobby Rumpf [place], PA-C  isosorbide mononitrate (IMDUR) 30 MG 24 hr tablet Take 30 mg by mouth every morning. 10/11/14  Yes Historical Provider, MD  levothyroxine (SYNTHROID, LEVOTHROID) 50 MCG tablet Take 50 mcg by mouth daily. 08/23/13  Yes Historical Provider, MD  metFORMIN (GLUCOPHAGE) 500 MG tablet Take 1 tablet (500 mg total) by mouth daily. 11/02/14  Yes Charolette Forward, MD  metoprolol (LOPRESSOR) 25 MG tablet Take 1 tablet (25 mg total) by mouth 2 (two) times daily. 11/01/14  Yes Charolette Forward, MD  Polyethyl Glycol-Propyl Glycol 0.4-0.3 % SOLN Apply 1 drop to eye as needed (dry eyes).    Yes Historical Provider, MD  azithromycin (ZITHROMAX Z-PAK) 250 MG tablet 2 tablets by mouth today and then one tablet daily for 4 more days Patient not taking: Reported on 11/24/2014 11/01/14   Charolette Forward, MD  hydrochlorothiazide (MICROZIDE) 12.5 MG capsule Take 12.5 mg by mouth daily. 10/05/14   Historical Provider, MD  levalbuterol Penne Lash) 0.63 MG/3ML nebulizer solution Take 3 mLs (0.63 mg total) by nebulization every 4 (four) hours as needed for wheezing or shortness of breath. DX Chr Diastolic CHF ICD 10 - 130.86 Patient not taking: Reported on 10/30/2014 05/17/14   Thurnell Lose, MD  nitroGLYCERIN (NITROSTAT) 0.4 MG SL tablet Place 1 tablet (0.4 mg total) under the tongue every 5 (five) minutes as needed for chest pain. 11/01/14   Charolette Forward, MD   BP 167/60 mmHg  Pulse 67  Temp(Src) 98.6 F (37 C) (Rectal)  Resp 20  SpO2 95% Physical Exam  Constitutional: She is oriented to person, place, and time. She appears well-developed and well-nourished.  HENT:  Head: Normocephalic and atraumatic.  Eyes: Conjunctivae and EOM are normal. Pupils are equal, round, and reactive to light. Right eye exhibits no discharge. Left eye exhibits no discharge.  Neck: Normal range of motion. Neck supple.  Cardiovascular: Normal rate, regular rhythm and normal heart sounds.   Pulses:      Radial pulses are  2+ on the right side, and 2+ on the left side.       Dorsalis pedis pulses are 2+ on the right side, and 2+ on the left side.  Cap refill < 3 seconds Negative swelling or pitting edema noted to the lower extremities bilaterally   Pulmonary/Chest: No respiratory distress. She has no wheezes. She has rales (diffusely to upper and lower lobes bilaterally). She exhibits no tenderness.  Patient currently on nasal cannula at 4 L/m Patient using abdominal wall muscles for breathing Decreased breath sounds to upper and lower lobes bilaterally, positive rales  Musculoskeletal: Normal range of motion.  Neurological: She is alert and oriented to person, place, and time. No cranial nerve deficit. She exhibits normal muscle tone. Coordination normal. GCS eye subscore is 4. GCS verbal subscore is 5. GCS motor subscore is 6.  Skin: Skin is warm and dry. No rash noted. She is not diaphoretic. No erythema.  Psychiatric: She has  a normal mood and affect. Her behavior is normal. Thought content normal.  Nursing note and vitals reviewed.   ED Course  Procedures (including critical care time) Labs Review Labs Reviewed  CBC WITH DIFFERENTIAL/PLATELET - Abnormal; Notable for the following:    Hemoglobin 11.6 (*)    All other components within normal limits  COMPREHENSIVE METABOLIC PANEL - Abnormal; Notable for the following:    Chloride 99 (*)    CO2 33 (*)    Glucose, Bld 140 (*)    Creatinine, Ser 1.06 (*)    GFR calc non Af Amer 47 (*)    GFR calc Af Amer 54 (*)    All other components within normal limits  BRAIN NATRIURETIC PEPTIDE - Abnormal; Notable for the following:    B Natriuretic Peptide 608.3 (*)    All other components within normal limits  TROPONIN I  I-STAT TROPOININ, ED  I-STAT CG4 LACTIC ACID, ED    Imaging Review Dg Chest Port 1 View  11/24/2014   CLINICAL DATA:  Short of breath, pneumonia  EXAM: PORTABLE CHEST - 1 VIEW  COMPARISON:  Radiograph 11/11/2014  FINDINGS: Normal cardiac  silhouette. There is bibasilar airspace opacities. Upper lungs are clear. No pneumothorax.  IMPRESSION: Bibasilar airspace disease suggests pulmonary edema.   Electronically Signed   By: Suzy Bouchard M.D.   On: 11/24/2014 11:29     EKG Interpretation   Date/Time:  Sunday November 24 2014 09:47:54 EDT Ventricular Rate:  65 PR Interval:  216 QRS Duration: 80 QT Interval:  446 QTC Calculation: 463 R Axis:   69 Text Interpretation:  Sinus rhythm with 1st degree A-V block ST \\T\\ T wave  abnormality, consider inferior ischemia Abnormal ECG Reconfirmed by  JACUBOWITZ  MD, SAM ([id number]) on 11/24/2014 11:57:18 AM       10 :19 AM Dr. Winfred Leeds at bedside assessing patient.   11:03 AM Patient wheezing and breathing harder. Albuterol ordered.   11: 26 AM Patient did not respond well to albuterol inhaler. Patient will be placed on BiPAP - continue to have tachypnea and difficulty breathing.  11:37 AM Chest x-ray has resulted identifying pulmonary edema. Awaiting BNP and CMP. Patient will be placed on BiPAP. Suspicion for possible fluid overload, CHF exacerbation.  11:49 AM Patient currently on BiPAP, tolerating well.   12:37 PM discussed labs in great detail with attending physician, Dr. Cathleen Fears who recommended patient to be placed on IV nitroglycerin infusion and Lasix IV 40 mg to be administered for CHF exacerbation.  12:44 PM Patient doing well on the BiPAP. Patient able to hold a conversation. Discussed the likelihood of CHF exacerbation, discussed plan for admission with patient who agrees to plan of care.  12:50 PM Spoke with Dr. Dyann Kief from [facility] hospitalist, recommended to speak with Dr. Terrence Dupont because he normally accepts his patients.  12:54 PM Spoke with Dr. Terrence Dupont - agreed that patient needs to be admitted. Recommended speak with Dr. Doylene Canard since he is on call today for admission.   1:01 PM This provider spoke with Dr. Doylene Canard, - discussed case, labs, imaging, ED course in great  detail. Patient to be admitted to Dr. Doylene Canard.   1:19 PM Dr. Doylene Canard at bedside.   CRITICAL CARE Performed by: Jamse Mead   Total critical care time: 40  Critical care time was exclusive of separately billable procedures and treating other patients.  Critical care was necessary to treat or prevent imminent or life-threatening deterioration.  Critical care was time spent personally by me  on the following activities: development of treatment plan with patient and/or surrogate as well as nursing, discussions with consultants, evaluation of patient's response to treatment, examination of patient, obtaining history from patient or surrogate, ordering and performing treatments and interventions, ordering and review of laboratory studies, ordering and review of radiographic studies, pulse oximetry and re-evaluation of patient's condition.   MDM   Final diagnoses:  CHF exacerbation  Hypoxia  Respiratory distress   Medications  nitroGLYCERIN 50 mg in dextrose 5 % 250 mL (0.2 mg/mL) infusion (10 mcg/min Intravenous New Bag/Given 11/24/14 1309)  albuterol (PROVENTIL) (2.5 MG/3ML) 0.083% nebulizer solution 5 mg (5 mg Nebulization Given 11/24/14 1105)  ipratropium (ATROVENT) nebulizer solution 0.5 mg (0.5 mg Nebulization Given 11/24/14 1105)  furosemide (LASIX) injection 40 mg (40 mg Intravenous Given 11/24/14 1308)    Filed Vitals:   11/24/14 0953 11/24/14 1105 11/24/14 1110 11/24/14 1152  BP: 152/116   167/60  Pulse: 64   67  Temp: 97.4 F (36.3 C)  98.6 F (37 C)   TempSrc: Oral  Rectal   Resp: 18   20  SpO2: 96% 95%     This provider reviewed the patient's chart. Patient was admitted to the hospital and a cardiac cath was performed on 10/31/2014 by Dr. Terrence Dupont. Patient had 2 stents placed secondary to chronically occluded LAD. Patient was discharged to phase II cardiac rehabilitation. Has been experiencing shortness of breath for the past week, at rest and with exertion. Recently  treated for bronchitis, was placed on azithromycin that was completed 2 days ago. EKG noted sinus rhythm with first-degree AV block-abnormal. I-STAT troponin negative elevation. BNP mildly elevated at 608.3. CBC negative elevated leukocytosis. Hemoglobin 11.6, hematocrit 36.2. CMP 1.06. Lactic acid negative elevation. Portable chest x-ray noted bibasally or airspace disease suggest pulmonary edema. Upon arrival to the emergency department, patient had a pulse ox of 84-86% on room air. Patient was immediately placed on nasal cannula, oxygen therapy. Patient currently placed on 4 L/m. patient given albuterol inhaler in ED setting without relief. Patient had increase in work for breathing - patient placed on BiPAP - tolerating well. Negative elevation of leukocytosis, negative elevated lactic acid, negative temperature-doubt pneumonia at this time. Suspicion to be CHF exacerbation. Patient given IV Lasix and started on IV nitroglycerin in ED setting. Spoke with Dr. Doylene Canard - patient to be admitted under his care. Discussed plan for admission with patient who is in accordance to plan of care.  Jamse Mead, PA-C 11/24/14 1321  Orlie Dakin, MD 11/24/14 1701

## 2014-11-24 NOTE — ED Notes (Signed)
Pt reports continued SOB since may when she had 2 stents placed. Pt was told that she has bronchitis by her PCP. Pt has not had any relief after finishing meds.

## 2014-11-24 NOTE — ED Provider Notes (Signed)
Complains of shortness of breath progressively worsening for the past one week and she presents today as breathing got worse today. She denies cough denies fever denies pain anywhere. On exam alert speaks in sentences. HEENT exam no facial asymmetry neck supple no JVD heart regular rate and rhythm Lungs with diffuse crackles extremities without edema  Orlie Dakin, MD 11/24/14 1025

## 2014-11-25 ENCOUNTER — Inpatient Hospital Stay (HOSPITAL_COMMUNITY): Payer: Medicare Other

## 2014-11-25 LAB — BASIC METABOLIC PANEL
Anion gap: 9 (ref 5–15)
BUN: 16 mg/dL (ref 6–20)
CO2: 34 mmol/L — AB (ref 22–32)
Calcium: 8.6 mg/dL — ABNORMAL LOW (ref 8.9–10.3)
Chloride: 98 mmol/L — ABNORMAL LOW (ref 101–111)
Creatinine, Ser: 1.15 mg/dL — ABNORMAL HIGH (ref 0.44–1.00)
GFR calc Af Amer: 49 mL/min — ABNORMAL LOW (ref >60)
GFR calc non Af Amer: 42 mL/min — ABNORMAL LOW (ref >60)
Glucose, Bld: 93 mg/dL (ref 65–99)
POTASSIUM: 3.2 mmol/L — AB (ref 3.5–5.1)
SODIUM: 141 mmol/L (ref 135–145)

## 2014-11-25 LAB — TROPONIN I

## 2014-11-25 MED ORDER — POTASSIUM CHLORIDE CRYS ER 20 MEQ PO TBCR
40.0000 meq | EXTENDED_RELEASE_TABLET | Freq: Once | ORAL | Status: AC
  Administered 2014-11-25: 40 meq via ORAL
  Filled 2014-11-25: qty 2

## 2014-11-25 MED ORDER — PERFLUTREN LIPID MICROSPHERE
1.0000 mL | INTRAVENOUS | Status: AC | PRN
  Filled 2014-11-25: qty 10

## 2014-11-25 MED ORDER — POTASSIUM CHLORIDE ER 10 MEQ PO TBCR
20.0000 meq | EXTENDED_RELEASE_TABLET | Freq: Two times a day (BID) | ORAL | Status: DC
  Administered 2014-11-25 – 2014-11-28 (×7): 20 meq via ORAL
  Filled 2014-11-25 (×10): qty 2

## 2014-11-25 NOTE — Care Management Note (Signed)
Case Management Note  Patient Details  Name: Diane Fry MRN: [id number] Date of Birth: 1928-12-02  Subjective/Objective:         Adm w shortness of breath           Action/Plan: lives alone, pcp dr Janeice Robinson   Expected Discharge Date:                  Expected Discharge Plan:  [place]  In-House Referral:     Discharge planning Services     Post Acute Care Choice:    Choice offered to:     DME Arranged:    DME Agency:     HH Arranged:    [place] Agency:     Status of Service:     Medicare Important Message Given:    Date Medicare IM Given:    Medicare IM give by:    Date Additional Medicare IM Given:    Additional Medicare Important Message give by:     If discussed at [place] of Stay Meetings, dates discussed:    Additional Comments: ur review done  Lacretia Leigh, RN 11/25/2014, 9:19 AM

## 2014-11-25 NOTE — Progress Notes (Signed)
Subjective:  Patient denies any chest pain.  States breathing is improved.  Denies any palpitation, lightheadedness or syncope.  Denies any fever or chills.  Objective:  Vital Signs in the last 24 hours: Temp:  [97.8 F (36.6 C)-98.5 F (36.9 C)] 97.8 F (36.6 C) (06/06 1600) Pulse Rate:  [49-67] 61 (06/06 1700) Resp:  [13-32] 23 (06/06 1700) BP: (90-170)/(31-111) 134/61 mmHg (06/06 1700) SpO2:  [92 %-100 %] 98 % (06/06 1700) Weight:  [78.336 kg (172 lb 11.2 oz)] 78.336 kg (172 lb 11.2 oz) (06/06 0400)  Intake/Output from previous day: 06/05 0701 - 06/06 0700 In: -  Out: 2010 [Urine:2010] Intake/Output from this shift: Total I/O In: 560 [P.O.:480; I.V.:80] Out: 1800 [Urine:1800]  Physical Exam: Neck: no adenopathy, no carotid bruit and supple, symmetrical, trachea midline Lungs: decreased breath sounds at bases with bibasilar rales Heart: regular rate and rhythm, S1, S2 normal and soft systolic murmur and S3 gallop noted Abdomen: soft, non-tender; bowel sounds normal; no masses,  no organomegaly Extremities: extremities normal, atraumatic, no cyanosis or edema  Lab Results:  Recent Labs  11/24/14 1040  WBC 7.7  HGB 11.6*  PLT 272    Recent Labs  11/24/14 1040 11/25/14 0551  NA 141 141  K 3.8 3.2*  CL 99* 98*  CO2 33* 34*  GLUCOSE 140* 93  BUN 11 16  CREATININE 1.06* 1.15*    Recent Labs  11/24/14 2239 11/25/14 0551  TROPONINI <0.03 <0.03   Hepatic Function Panel  Recent Labs  11/24/14 1040  PROT 7.7  ALBUMIN 3.7  AST 23  ALT 20  ALKPHOS 75  BILITOT 1.1   No results for input(s): CHOL in the last 72 hours. No results for input(s): PROTIME in the last 72 hours.  Imaging: Imaging results have been reviewed and Dg Chest 2 View  11/25/2014   CLINICAL DATA:  CHF, followup, history hypertension, diabetes, coronary artery disease  EXAM: CHEST  2 VIEW  COMPARISON:  11/24/2014  FINDINGS: Enlargement of cardiac silhouette with pulmonary vascular  congestion.  Improved pulmonary edema.  Atherosclerotic calcification aorta.  Small bibasilar effusions.  No pneumothorax.  Bones demineralized.  IMPRESSION: Improved pulmonary edema.   Electronically Signed   By: Lavonia Dana M.D.   On: 11/25/2014 07:59   Dg Chest Port 1 View  11/24/2014   CLINICAL DATA:  Short of breath, pneumonia  EXAM: PORTABLE CHEST - 1 VIEW  COMPARISON:  Radiograph 11/11/2014  FINDINGS: Normal cardiac silhouette. There is bibasilar airspace opacities. Upper lungs are clear. No pneumothorax.  IMPRESSION: Bibasilar airspace disease suggests pulmonary edema.   Electronically Signed   By: Suzy Bouchard M.D.   On: 11/24/2014 11:29    Cardiac Studies:  Assessment/Plan:  Resolving acute congestive heart failure secondary to preserved LV systolic function Status post recent PCI.  2.  Chronically occluded LAD. Coronary artery disease, history of non-Q-wave MI in the past, status post PCI to LAD in 2004 Hypertension Hyperlipidemia Hypothyroidism. Morbid obesity. History of paroxysmal atrial fibrillation. Osteoarthritis. Hypokalemia Plan  replace K Check labs in a.m.   LOS: 1 day    Charolette Forward 11/25/2014, 5:08 PM

## 2014-11-25 NOTE — Progress Notes (Signed)
Pt stated she was doing well on nasal cannula and did not need BiPAP, RT informed pt she changed her mind to call for RT

## 2014-11-25 NOTE — Progress Notes (Signed)
  Echocardiogram 2D Echocardiogram has been performed.  Darlina Sicilian M 11/25/2014, 11:34 AM

## 2014-11-26 LAB — BASIC METABOLIC PANEL
ANION GAP: 10 (ref 5–15)
BUN: 21 mg/dL — AB (ref 6–20)
CO2: 33 mmol/L — ABNORMAL HIGH (ref 22–32)
CREATININE: 1.31 mg/dL — AB (ref 0.44–1.00)
Calcium: 8.6 mg/dL — ABNORMAL LOW (ref 8.9–10.3)
Chloride: 98 mmol/L — ABNORMAL LOW (ref 101–111)
GFR calc non Af Amer: 36 mL/min — ABNORMAL LOW (ref >60)
GFR, EST AFRICAN AMERICAN: 42 mL/min — AB (ref >60)
GLUCOSE: 99 mg/dL (ref 65–99)
POTASSIUM: 4.1 mmol/L (ref 3.5–5.1)
SODIUM: 141 mmol/L (ref 135–145)

## 2014-11-26 LAB — CBC
HCT: 30.8 % — ABNORMAL LOW (ref 36.0–46.0)
HEMOGLOBIN: 9.6 g/dL — AB (ref 12.0–15.0)
MCH: 27.8 pg (ref 26.0–34.0)
MCHC: 31.2 g/dL (ref 30.0–36.0)
MCV: 89.3 fL (ref 78.0–100.0)
Platelets: 221 10*3/uL (ref 150–400)
RBC: 3.45 MIL/uL — ABNORMAL LOW (ref 3.87–5.11)
RDW: 15.3 % (ref 11.5–15.5)
WBC: 5.2 10*3/uL (ref 4.0–10.5)

## 2014-11-26 LAB — MAGNESIUM: MAGNESIUM: 1.7 mg/dL (ref 1.7–2.4)

## 2014-11-26 MED ORDER — HYDROCODONE-ACETAMINOPHEN 5-325 MG PO TABS
1.0000 | ORAL_TABLET | Freq: Two times a day (BID) | ORAL | Status: DC | PRN
  Administered 2014-11-26 – 2014-11-27 (×2): 1 via ORAL
  Filled 2014-11-26 (×2): qty 1

## 2014-11-26 MED FILL — Perflutren Lipid Microsphere IV Susp 1.1 MG/ML: INTRAVENOUS | Qty: 10 | Status: AC

## 2014-11-26 NOTE — Progress Notes (Signed)
Subjective:  Patient denies any chest pain states breathing is improved denies any palpitation lightheadedness or syncope.  Objective:  Vital Signs in the last 24 hours: Temp:  [97.6 F (36.4 C)-98.6 F (37 C)] 98.6 F (37 C) (06/07 0800) Pulse Rate:  [51-74] 74 (06/07 0902) Resp:  [13-27] 14 (06/07 0800) BP: (95-156)/(40-82) 156/71 mmHg (06/07 0902) SpO2:  [92 %-100 %] 96 % (06/07 0800) FiO2 (%):  [40 %] 40 % (06/07 0210) Weight:  [76.295 kg (168 lb 3.2 oz)] 76.295 kg (168 lb 3.2 oz) (06/07 0543)  Intake/Output from previous day: 06/06 0701 - 06/07 0700 In: 697.3 [P.O.:480; I.V.:217.3] Out: 2615 [Urine:2615] Intake/Output from this shift: Total I/O In: 6 [I.V.:6] Out: -   Physical Exam: Neck: no adenopathy, no carotid bruit, no JVD and supple, symmetrical, trachea midline Lungs: Decrease pressor at bases with faint rales Heart: regular rate and rhythm, S1, S2 normal and Soft systolic murmur and S3 gallop noted Abdomen: soft, non-tender; bowel sounds normal; no masses,  no organomegaly Extremities: extremities normal, atraumatic, no cyanosis or edema  Lab Results:  Recent Labs  11/24/14 1040 11/26/14 0300  WBC 7.7 5.2  HGB 11.6* 9.6*  PLT 272 221    Recent Labs  11/25/14 0551 11/26/14 0300  NA 141 141  K 3.2* 4.1  CL 98* 98*  CO2 34* 33*  GLUCOSE 93 99  BUN 16 21*  CREATININE 1.15* 1.31*    Recent Labs  11/24/14 2239 11/25/14 0551  TROPONINI <0.03 <0.03   Hepatic Function Panel  Recent Labs  11/24/14 1040  PROT 7.7  ALBUMIN 3.7  AST 23  ALT 20  ALKPHOS 75  BILITOT 1.1   No results for input(s): CHOL in the last 72 hours. No results for input(s): PROTIME in the last 72 hours.  Imaging: Imaging results have been reviewed and Dg Chest 2 View  11/25/2014   CLINICAL DATA:  CHF, followup, history hypertension, diabetes, coronary artery disease  EXAM: CHEST  2 VIEW  COMPARISON:  11/24/2014  FINDINGS: Enlargement of cardiac silhouette with  pulmonary vascular congestion.  Improved pulmonary edema.  Atherosclerotic calcification aorta.  Small bibasilar effusions.  No pneumothorax.  Bones demineralized.  IMPRESSION: Improved pulmonary edema.   Electronically Signed   By: Lavonia Dana M.D.   On: 11/25/2014 07:59   Dg Chest Port 1 View  11/24/2014   CLINICAL DATA:  Short of breath, pneumonia  EXAM: PORTABLE CHEST - 1 VIEW  COMPARISON:  Radiograph 11/11/2014  FINDINGS: Normal cardiac silhouette. There is bibasilar airspace opacities. Upper lungs are clear. No pneumothorax.  IMPRESSION: Bibasilar airspace disease suggests pulmonary edema.   Electronically Signed   By: Suzy Bouchard M.D.   On: 11/24/2014 11:29    Cardiac Studies:  Assessment/Plan:  Resolving acute congestive heart failure secondary to preserved LV systolic function Status post recent PCI to Chronically occluded LAD. Coronary artery disease, history of non-Q-wave MI in the past, status post PCI to LAD in 2004 Hypertension Hyperlipidemia Hypothyroidism. Morbid obesity. History of paroxysmal atrial fibrillation. Osteoarthritis. Status post Hypokalemia Plan OT PT consult Transfer to telemetry Check labs in a.m.  LOS: 2 days    Charolette Forward 11/26/2014, 9:15 AM

## 2014-11-26 NOTE — Progress Notes (Signed)
Pt. C/o shooting, throbbing, pain to her legs. Pt. States she takes vicodin to help control the pain. Dr. Terrence Dupont made aware. New orders received. RN will continue to monitor pt. For changes in condition. Abdulkareem Badolato, Katherine Roan'

## 2014-11-27 LAB — BASIC METABOLIC PANEL
Anion gap: 10 (ref 5–15)
BUN: 20 mg/dL (ref 6–20)
CO2: 34 mmol/L — ABNORMAL HIGH (ref 22–32)
Calcium: 9 mg/dL (ref 8.9–10.3)
Chloride: 96 mmol/L — ABNORMAL LOW (ref 101–111)
Creatinine, Ser: 1.23 mg/dL — ABNORMAL HIGH (ref 0.44–1.00)
GFR, EST AFRICAN AMERICAN: 45 mL/min — AB (ref >60)
GFR, EST NON AFRICAN AMERICAN: 39 mL/min — AB (ref >60)
Glucose, Bld: 93 mg/dL (ref 65–99)
Potassium: 4.5 mmol/L (ref 3.5–5.1)
Sodium: 140 mmol/L (ref 135–145)

## 2014-11-27 LAB — CBC
HEMATOCRIT: 33.5 % — AB (ref 36.0–46.0)
Hemoglobin: 10.4 g/dL — ABNORMAL LOW (ref 12.0–15.0)
MCH: 27.7 pg (ref 26.0–34.0)
MCHC: 31 g/dL (ref 30.0–36.0)
MCV: 89.3 fL (ref 78.0–100.0)
Platelets: 205 10*3/uL (ref 150–400)
RBC: 3.75 MIL/uL — ABNORMAL LOW (ref 3.87–5.11)
RDW: 15.1 % (ref 11.5–15.5)
WBC: 6.2 10*3/uL (ref 4.0–10.5)

## 2014-11-27 LAB — BRAIN NATRIURETIC PEPTIDE: B NATRIURETIC PEPTIDE 5: 154.3 pg/mL — AB (ref 0.0–100.0)

## 2014-11-27 MED ORDER — LOSARTAN POTASSIUM 25 MG PO TABS
25.0000 mg | ORAL_TABLET | Freq: Every day | ORAL | Status: DC
  Administered 2014-11-27 – 2014-11-28 (×2): 25 mg via ORAL
  Filled 2014-11-27 (×2): qty 1

## 2014-11-27 NOTE — Evaluation (Addendum)
Physical Therapy Evaluation Patient Details Name: Diane Fry MRN: [id number] DOB: October 08, 1928 Today's Date: 11/27/2014   History of Present Illness  81 year old female admitted with progressively worsening SOB for the past one week. Last month she had PTCA stenting of LAD x 2. Past medical history is significant for hypertension, hyperlipidemia, hypothyroidism,Obesity and Paroxysmal atrial fibrillation.   Clinical Impression  Pt is at or close to baseline functioning and should be safe at home with available assist . There are no further acute PT needs.    She will likely need to build up her endurance before return to work. Will sign off at this time.     Follow Up Recommendations No PT follow up    Equipment Recommendations  None recommended by PT    Recommendations for Other Services       Precautions / Restrictions Precautions Precautions: None      Mobility  Bed Mobility Overal bed mobility: Modified Independent             General bed mobility comments: transitioned smoothly  Transfers Overall transfer level: Modified independent                  Ambulation/Gait Ambulation/Gait assistance: Independent Ambulation Distance (Feet): 250 Feet Assistive device: None Gait Pattern/deviations: Step-through pattern     General Gait Details: short slower steps, but steady  Stairs Stairs: Yes Stairs assistance: Modified independent (Device/Increase time) Stair Management: One rail Left;Alternating pattern;Step to pattern;Forwards Number of Stairs: 2 General stair comments: safe with rail  Wheelchair Mobility    Modified Rankin (Stroke Patients Only)       Balance Overall balance assessment: No apparent balance deficits (not formally assessed)                                           Pertinent Vitals/Pain Pain Assessment: No/denies pain    Home Living Family/patient expects to be discharged to:: Private residence Living  Arrangements: Alone Available Help at Discharge: Other (Comment) (unsure how much assist she has since brother died.) Type of Home: House Home Access: Stairs to enter Entrance Stairs-Rails: Psychiatric nurse of Steps: 4 Home Layout: One level Home Equipment: Bedside commode;Shower seat      Prior Function Level of Independence: Independent         Comments: Works as Engineer, drilling        Extremity/Trunk Assessment   Upper Extremity Assessment: Defer to OT evaluation           Lower Extremity Assessment: Overall WFL for tasks assessed      Cervical / Trunk Assessment: Normal  Communication   Communication: No difficulties  Cognition Arousal/Alertness: Awake/alert Behavior During Therapy: WFL for tasks assessed/performed Overall Cognitive Status: Within Functional Limits for tasks assessed                      General Comments      Exercises        Assessment/Plan    PT Assessment Patent does not need any further PT services  PT Diagnosis Other (comment) (decreased activity tolerance)   PT Problem List    PT Treatment Interventions     PT Goals (Current goals can be found in the Care Plan section) Acute Rehab PT Goals PT Goal Formulation: All assessment and education complete, DC therapy  Frequency     Barriers to discharge        Co-evaluation               End of Session   Activity Tolerance: Patient tolerated treatment well Patient left: Other (comment) (sitting EOB) Nurse Communication: Mobility status         Time: 9518-8416 PT Time Calculation (min) (ACUTE ONLY): 22 min   Charges:   PT Evaluation $Initial PT Evaluation Tier I: 1 Procedure     PT G Codes:        Jadarion Halbig, Tessie Fass 11/27/2014, 10:12 AM 11/27/2014  Donnella Sham, PT [phone]  (pager)

## 2014-11-27 NOTE — Evaluation (Signed)
Occupational Therapy Evaluation Patient Details Name: Diane Fry MRN: [id number] DOB: Nov 14, 1928 Today's Date: 11/27/2014    History of Present Illness 81 year old female admitted with progressively worsening SOB for the past one week. Last month she had PTCA stenting of LAD x 2. Past medical history is significant for hypertension, hyperlipidemia, hypothyroidism,Obesity and Paroxysmal atrial fibrillation.    Clinical Impression   Pt is performing at a modified independent level.  02 sats were 94% on RA with activity.  No further OT needs.   Follow Up Recommendations  No OT follow up    Equipment Recommendations  None recommended by OT    Recommendations for Other Services       Precautions / Restrictions Precautions Precautions: None      Mobility Bed Mobility                  Transfers Overall transfer level: Modified independent Equipment used: None                  Balance                                            ADL Overall ADL's : At baseline                                             Vision     Perception     Praxis      Pertinent Vitals/Pain Pain Assessment: No/denies pain     Hand Dominance Right   Extremity/Trunk Assessment Upper Extremity Assessment Upper Extremity Assessment: Overall WFL for tasks assessed   Lower Extremity Assessment Lower Extremity Assessment: Overall WFL for tasks assessed   Cervical / Trunk Assessment Cervical / Trunk Assessment: Normal   Communication Communication Communication: No difficulties   Cognition Arousal/Alertness: Awake/alert Behavior During Therapy: WFL for tasks assessed/performed Overall Cognitive Status: Within Functional Limits for tasks assessed                     General Comments       Exercises       Shoulder Instructions      Home Living Family/patient expects to be discharged to:: Private residence Living  Arrangements: Alone Available Help at Discharge: Available PRN/intermittently;Friend(s) Type of Home: House Home Access: Stairs to enter [employer] of Steps: 4 Entrance Stairs-Rails: Right;Left Home Layout: One level     Bathroom Shower/Tub: Teacher, early years/pre: Standard     Home Equipment: Bedside commode;Shower seat          Prior Functioning/Environment Level of Independence: Independent        Comments: Works as Actor    OT Diagnosis:     OT Problem List:     OT Treatment/Interventions:      OT Goals(Current goals can be found in the care plan section) Acute Rehab OT Goals Patient Stated Goal: return to work  OT Frequency:     Barriers to D/C:            Co-evaluation              End of Session Nurse Communication:  (aware of 02 sats, ok to leave 02 off)  Activity Tolerance: Patient tolerated  treatment well Patient left: in chair;with call bell/phone within reach   Time: 1345-1407 OT Time Calculation (min): 22 min Charges:  OT General Charges $OT Visit: 1 Procedure OT Evaluation $Initial OT Evaluation Tier I: 1 Procedure G-Codes:    Malka So 11/27/2014, 2:07 PM  [phone]

## 2014-11-27 NOTE — Progress Notes (Signed)
Patient's assessment and medication pass completed.  Patient is stable and no complications at this time, will continue to monitor patient. Ruben Im RN

## 2014-11-27 NOTE — Progress Notes (Signed)
Subjective:  Doing well states breathing has improved denies any chest pain. Discussed with patient regarding skilled nursing facility states she still works at [employer] and would like to work a few more years and wanted to go home.   Objective:  Vital Signs in the last 24 hours: Temp:  [98.1 F (36.7 C)-98.6 F (37 C)] 98.6 F (37 C) (06/08 1055) Pulse Rate:  [55-65] 60 (06/08 1055) Resp:  [16-18] 16 (06/08 1055) BP: (137-151)/(57-78) 138/70 mmHg (06/08 1055) SpO2:  [97 %-98 %] 98 % (06/08 1055) Weight:  [75.025 kg (165 lb 6.4 oz)-76.295 kg (168 lb 3.2 oz)] 76.295 kg (168 lb 3.2 oz) (06/08 0500)  Intake/Output from previous day: 06/07 0701 - 06/08 0700 In: 746 [P.O.:720; I.V.:26] Out: 1600 [Urine:1600] Intake/Output from this shift: Total I/O In: 120 [P.O.:120] Out: 0   Physical Exam: Neck: no adenopathy, no carotid bruit, no JVD and supple, symmetrical, trachea midline Lungs: Decreased breath sound at bases with faint rales Heart: regular rate and rhythm, S1, S2 normal and Soft systolic murmur and S3 gallop noted Abdomen: soft, non-tender; bowel sounds normal; no masses,  no organomegaly Extremities: extremities normal, atraumatic, no cyanosis or edema  Lab Results:  Recent Labs  11/26/14 0300 11/27/14 0405  WBC 5.2 6.2  HGB 9.6* 10.4*  PLT 221 205    Recent Labs  11/26/14 0300 11/27/14 0405  NA 141 140  K 4.1 4.5  CL 98* 96*  CO2 33* 34*  GLUCOSE 99 93  BUN 21* 20  CREATININE 1.31* 1.23*    Recent Labs  11/24/14 2239 11/25/14 0551  TROPONINI <0.03 <0.03   Hepatic Function Panel No results for input(s): PROT, ALBUMIN, AST, ALT, ALKPHOS, BILITOT, BILIDIR, IBILI in the last 72 hours. No results for input(s): CHOL in the last 72 hours. No results for input(s): PROTIME in the last 72 hours.  Imaging: Imaging results have been reviewed and No results found.  Cardiac Studies:  Assessment/Plan:  Resolving acute congestive heart failure secondary to  preserved LV systolic function Status post recent PCI to Chronically occluded LAD. Coronary artery disease, history of non-Q-wave MI in the past, status post PCI to LAD in 2004 Hypertension Hyperlipidemia Hypothyroidism. Morbid obesity. History of paroxysmal atrial fibrillation. Osteoarthritis. Status post Hypokalemia Plan As per orders Increase ambulation Possible discharge tomorrow if stable  LOS: 3 days    Charolette Forward 11/27/2014, 11:59 AM

## 2014-11-28 MED ORDER — POTASSIUM CHLORIDE ER 20 MEQ PO TBCR: 20.0000 meq | EXTENDED_RELEASE_TABLET | Freq: Every day | ORAL | Status: DC

## 2014-11-28 MED ORDER — LOSARTAN POTASSIUM 25 MG PO TABS: 25.0000 mg | ORAL_TABLET | Freq: Every day | ORAL | Status: DC

## 2014-11-28 MED ORDER — LEVOTHYROXINE SODIUM 50 MCG PO TABS
50.0000 ug | ORAL_TABLET | Freq: Every day | ORAL | Status: DC
  Administered 2014-11-28: 50 ug via ORAL
  Filled 2014-11-28 (×2): qty 1

## 2014-11-28 MED ORDER — METOPROLOL TARTRATE 25 MG PO TABS: 12.5000 mg | ORAL_TABLET | Freq: Two times a day (BID) | ORAL | Status: AC

## 2014-11-28 MED ORDER — FUROSEMIDE 10 MG/ML IJ SOLN
40.0000 mg | Freq: Two times a day (BID) | INTRAMUSCULAR | Status: DC
  Administered 2014-11-28: 40 mg via INTRAVENOUS
  Filled 2014-11-28 (×2): qty 4

## 2014-11-28 MED ORDER — FUROSEMIDE 40 MG PO TABS: 40.0000 mg | ORAL_TABLET | Freq: Two times a day (BID) | ORAL | Status: DC

## 2014-11-28 NOTE — Progress Notes (Signed)
Home discharge instructions discussed with patient. Discussed diet, activity, medications and follow up appointment. Cardiac monitor discontinued. CCMD notified. Patient able to verbally use teachback correctly. Patient understands instructions.

## 2014-11-28 NOTE — Discharge Summary (Signed)
Discharge summary dictated on 11/28/2014 dictation number is [phone]

## 2014-11-28 NOTE — Plan of Care (Signed)
Problem: Phase I Progression Outcomes Goal: EF % per last Echo/documented,Core Reminder form on chart Outcome: Completed/Met Date Met:  11/28/14 EF = 55-60% per ECHO performed on 11/25/14.

## 2014-11-28 NOTE — Discharge Instructions (Signed)

## 2014-11-28 NOTE — Discharge Summary (Signed)
Diane Fry                 ACCOUNT NO.:  [id number]  MEDICAL RECORD NO.:  [id number]  LOCATION:  7E14C                        FACILITY:  [facility]  PHYSICIAN:  Diane Fry, M.D. DATE OF BIRTH:  [birth year]  DATE OF ADMISSION:  11/24/2014 DATE OF DISCHARGE:  11/28/2014                              DISCHARGE SUMMARY   ADMITTING DIAGNOSES: 1. Acute congestive heart failure. 2. Coronary artery disease, history of non-Q-wave myocardial     infarction in the past, status post PTCA stenting to LAD,     subsequently had PTCA stenting to chronically occluded LAD in May     2016. 3. Hypertension. 4. Hyperlipidemia. 5. Hypothyroidism. 6. Morbid obesity. 7. Osteoarthritis. 8. History of paroxysmal atrial fibrillation.  DISCHARGE DIAGNOSES: 1. Status post decompensated congestive heart failure secondary to     preserved left ventricular systolic function. 2. Coronary artery disease, history of non-Q-wave infarction in 2004     subsequently requiring PCI to LAD and also had PCI to chronically     occluded LAD in May 2016. 3. Hypertension. 4. Hyperlipidemia. 5. Hypothyroidism. 6. Morbid obesity. 7. Osteoarthritis. 8. History of paroxysmal atrial fibrillation.  DISCHARGE HOME MEDICATIONS: 1. Losartan 25 mg 1 tablet daily. 2. Potassium chloride 20 mEq 1 tablet daily. 3. Amiodarone 200 mg half tablet daily. 4. Aspirin 81 mg 1 tablet daily. 5. Atorvastatin 40 mg daily. 6. Clopidogrel 75 mg daily. 7. Isosorbide mononitrate 30 mg daily. 8. Levothyroxine 50 mcg daily. 9. Metformin 500 mg daily. 10.Nitrostat 0.4 mg sublingual use as directed. 11.Polyethylene glycol eyedrops as before. 12.Lasix 40 mg twice daily. 13.Metoprolol tartrate 12.5 mg twice daily. 14.The patient has been advised to stop alprazolam, azithromycin, and     Mucinex.  DIET:  Low salt low cholesterol 1800 calories ADA diet.  Heart failure instructions have been given.  The patient has been advised to  monitor weight daily.  CONDITION AT DISCHARGE:  Stable.  FOLLOWUP:  Follow up with me in 1 week.  BRIEF HISTORY AND HOSPITAL COURSE:  Diane Fry is an 81 year old female with past medical history significant for multiple medical problems, i.e., coronary artery disease, history of non-Q-wave myocardial infarction in remote past, subsequently had PCI to LAD, was noted to have chronically occluded LAD requiring PTCA stenting x2 in May 2016, approximately 4 weeks ago, hypertension, hyperlipidemia, hypothyroidism, obesity, history of paroxysmal AFib who was admitted by Dr. Doylene Canard because of worsening shortness of breath for 1 week and was noted to be in mild congestive heart failure.  PHYSICAL EXAMINATION:  VITAL SIGNS:  Her blood pressure was 139/59, pulse 60, she was afebrile. HEENT:  Conjunctivae were pink. NECK:  Supple.  Positive JVD. LUNGS:  She has crackles to auscultation without rhonchi. CARDIOVASCULAR:  S1, S2 were normal.  There was soft systolic murmur. ABDOMEN:  Soft.  Bowel sounds were present. EXTREMITIES:  There is no clubbing, cyanosis.  There was trace edema.  LABORATORY DATA:  Sodium was 141, potassium 3.8, BUN 11, creatinine 1.06.  Her BNP was 608.  Two sets of cardiac enzymes were normal. Lactic acid level was normal 1.22.  Hemoglobin was 11.6, hematocrit 36.2, white count of 7.7.  The ProBNP was  154, which is trending down. Her labs yesterday, sodium 140, potassium 4.5, BUN 20, creatinine 1.23. Hemoglobin 10.4, hematocrit 33.5, white count of 6.2.  BRIEF HOSPITAL COURSE:  The patient was admitted to telemetry unit.  MI was ruled out by serial enzymes and EKG.  The patient was started on IV Lasix with good diuresis and resolution of her shortness of breath. Discussed with the patient regarding skilled nursing facility, but states she has a lot of help at home and is actively working at [employer] and wishes to work for few more years.  The patient has been  counseled regarding diet, compliance with medication and followup.  Heart failure instructions have been given.  The patient will be discharged home on above medications and will be followed up in my office in 1 week.     Diane Fry, M.D.     MNH/MEDQ  D:  11/28/2014  T:  11/28/2014  Job:  [id number]

## 2014-11-28 NOTE — Progress Notes (Signed)
Patient's friend here to take patient home. Patient taken out of hospital for discharge.

## 2014-11-28 NOTE — Care Management Note (Signed)
Case Management Note  Patient Details  Name: Diane Fry MRN: [id number] Date of Birth: 16-Apr-1929  Subjective/Objective:     Admitted with CHF              Action/Plan: CM following for DCP, no therapy recommended by physical or occupational therapy at this time.  Expected Discharge Date:    possibly 11/30/2014              Expected Discharge Plan:  Home  Status of Service:   Inprogress  Medicare Important Message Given:  Yes Date Medicare IM Given:  11/28/14 Medicare IM give by:  Mindi Slicker RN  Royston Bake, RN,BSN,MHA [phone] 11/28/2014, 11:17 AM

## 2014-12-11 ENCOUNTER — Other Ambulatory Visit: Payer: Self-pay

## 2014-12-11 DIAGNOSIS — Z1231 Encounter for screening mammogram for malignant neoplasm of breast: Secondary | ICD-10-CM

## 2014-12-13 ENCOUNTER — Ambulatory Visit
Admission: RE | Admit: 2014-12-13 | Discharge: 2014-12-13 | Disposition: A | Discharge status: Home / Self Care | Payer: Medicare Other | Source: Ambulatory Visit

## 2014-12-13 DIAGNOSIS — Z1231 Encounter for screening mammogram for malignant neoplasm of breast: Secondary | ICD-10-CM

## 2015-07-15 ENCOUNTER — Ambulatory Visit
Admission: RE | Admit: 2015-07-15 | Discharge: 2015-07-15 | Disposition: A | Discharge status: Home / Self Care | Payer: Medicare Other | Source: Ambulatory Visit | Attending: Internal Medicine | Admitting: Internal Medicine

## 2015-07-15 ENCOUNTER — Other Ambulatory Visit: Payer: Self-pay | Admitting: Internal Medicine

## 2015-07-15 DIAGNOSIS — R05 Cough: Secondary | ICD-10-CM

## 2015-07-15 DIAGNOSIS — R059 Cough, unspecified: Secondary | ICD-10-CM

## 2015-09-02 ENCOUNTER — Other Ambulatory Visit: Payer: Self-pay | Admitting: Internal Medicine

## 2015-09-02 ENCOUNTER — Ambulatory Visit
Admission: RE | Admit: 2015-09-02 | Discharge: 2015-09-02 | Disposition: A | Discharge status: Home / Self Care | Payer: Medicare Other | Source: Ambulatory Visit | Attending: Internal Medicine | Admitting: Internal Medicine

## 2015-09-02 DIAGNOSIS — R0602 Shortness of breath: Secondary | ICD-10-CM

## 2015-10-20 IMAGING — CR DG CHEST 1V PORT
1 series · 1 of 1 positions shown · non-contrast
Comparison: Radiograph 11/11/2014

CLINICAL DATA: Short of breath, pneumonia

EXAM:
PORTABLE CHEST - 1 VIEW

[AP]
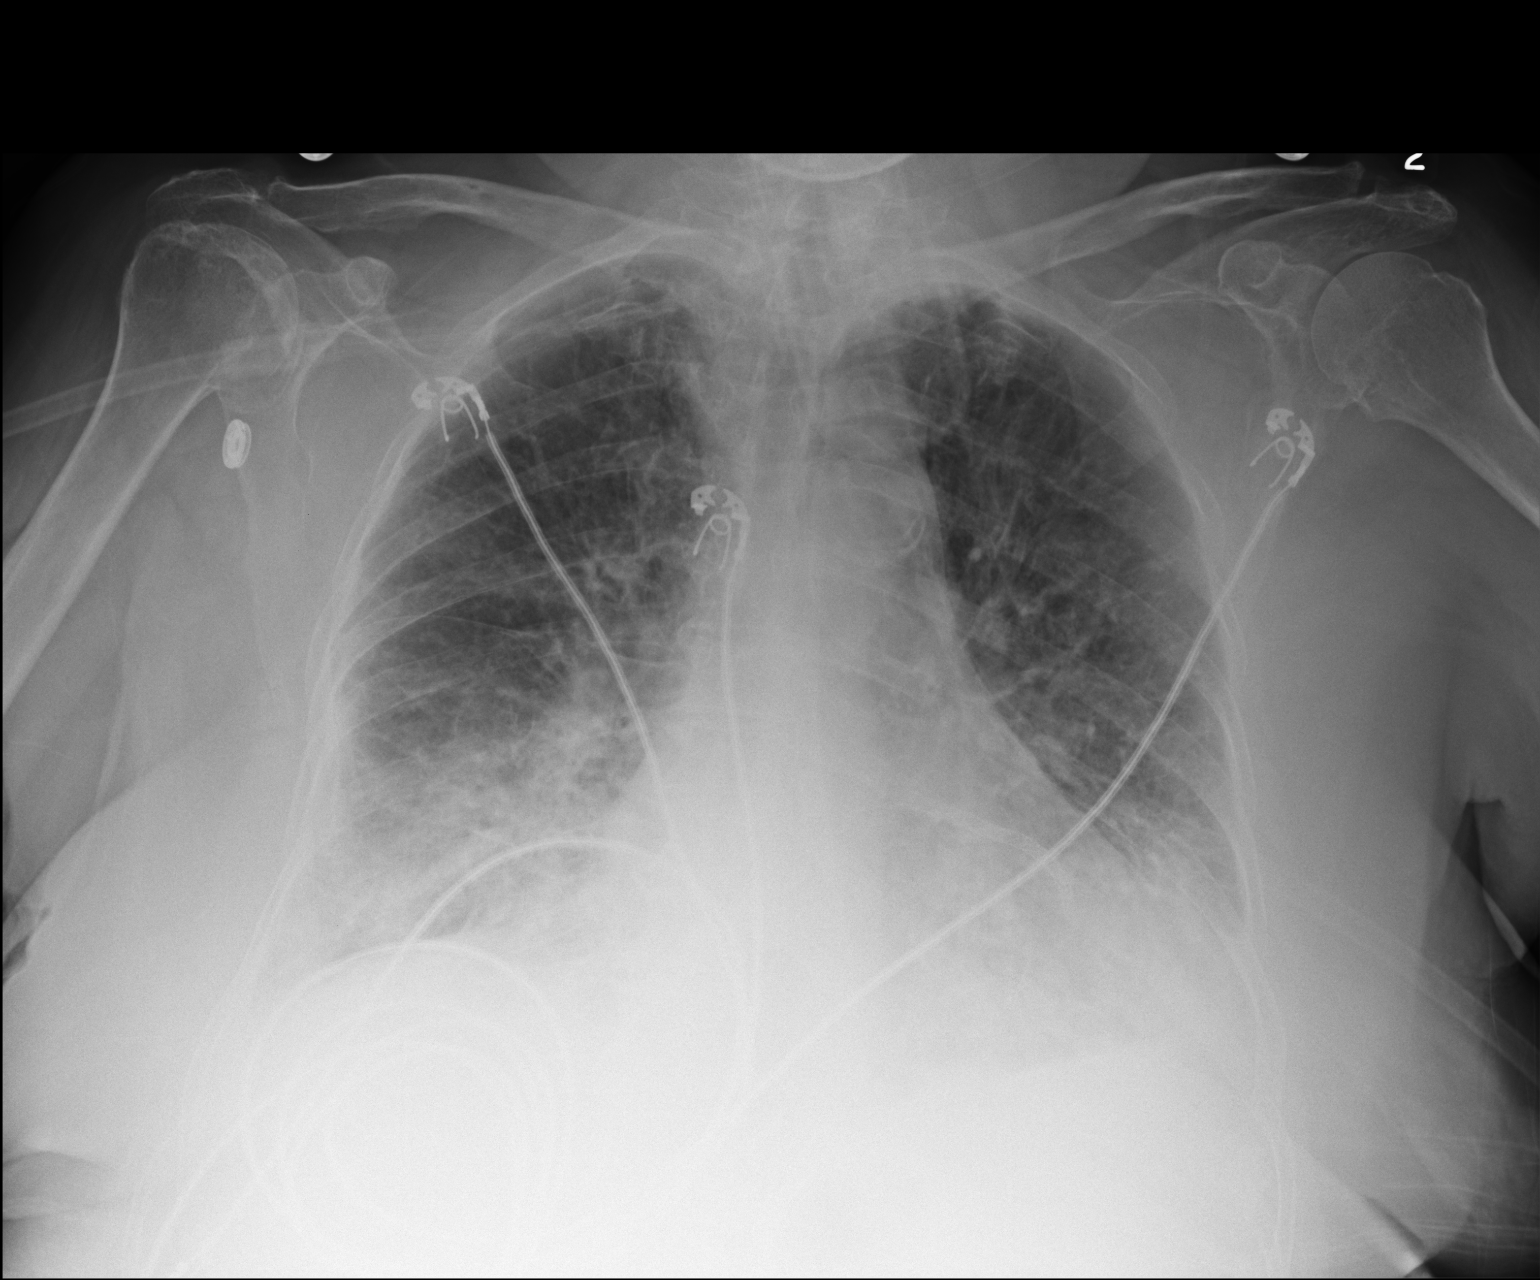

[1 of 1 positions shown; findings below may reference images not displayed]

FINDINGS: Normal cardiac silhouette. There is bibasilar airspace opacities.
Upper lungs are clear. No pneumothorax.
IMPRESSION: Bibasilar airspace disease suggests pulmonary edema.

## 2015-11-26 ENCOUNTER — Ambulatory Visit
Admission: RE | Admit: 2015-11-26 | Discharge: 2015-11-26 | Disposition: A | Discharge status: Home / Self Care | Payer: Medicare Other | Source: Ambulatory Visit | Attending: Internal Medicine | Admitting: Internal Medicine

## 2015-11-26 ENCOUNTER — Other Ambulatory Visit: Payer: Self-pay | Admitting: Internal Medicine

## 2015-11-26 DIAGNOSIS — R0602 Shortness of breath: Secondary | ICD-10-CM

## 2016-08-02 ENCOUNTER — Ambulatory Visit
Admission: RE | Admit: 2016-08-02 | Discharge: 2016-08-02 | Disposition: A | Discharge status: Home / Self Care | Payer: Medicare Other | Source: Ambulatory Visit | Attending: Internal Medicine | Admitting: Internal Medicine

## 2016-08-02 ENCOUNTER — Other Ambulatory Visit: Payer: Self-pay | Admitting: Internal Medicine

## 2016-08-02 DIAGNOSIS — R0602 Shortness of breath: Secondary | ICD-10-CM

## 2016-09-30 ENCOUNTER — Encounter (HOSPITAL_COMMUNITY)
Admission: EM | Disposition: A | Discharge status: Skilled Nursing Facility | Payer: Self-pay | Source: Home / Self Care | Attending: Internal Medicine

## 2016-09-30 ENCOUNTER — Encounter (HOSPITAL_COMMUNITY): Payer: Self-pay | Admitting: Emergency Medicine

## 2016-09-30 ENCOUNTER — Inpatient Hospital Stay (HOSPITAL_COMMUNITY): Payer: Medicare Other | Admitting: Anesthesiology

## 2016-09-30 ENCOUNTER — Emergency Department (HOSPITAL_COMMUNITY): Payer: Medicare Other

## 2016-09-30 ENCOUNTER — Inpatient Hospital Stay (HOSPITAL_COMMUNITY): Payer: Medicare Other

## 2016-09-30 ENCOUNTER — Inpatient Hospital Stay (HOSPITAL_COMMUNITY)
Admission: EM | Admit: 2016-09-30 | Discharge: 2016-10-04 | DRG: 481 | Disposition: A | Discharge status: Skilled Nursing Facility | Payer: Medicare Other | Attending: Internal Medicine | Admitting: Internal Medicine

## 2016-09-30 DIAGNOSIS — Z8781 Personal history of (healed) traumatic fracture: Secondary | ICD-10-CM | POA: Diagnosis not present

## 2016-09-30 DIAGNOSIS — D62 Acute posthemorrhagic anemia: Secondary | ICD-10-CM | POA: Diagnosis not present

## 2016-09-30 DIAGNOSIS — E039 Hypothyroidism, unspecified: Secondary | ICD-10-CM | POA: Diagnosis present

## 2016-09-30 DIAGNOSIS — S72002A Fracture of unspecified part of neck of left femur, initial encounter for closed fracture: Secondary | ICD-10-CM | POA: Diagnosis not present

## 2016-09-30 DIAGNOSIS — F419 Anxiety disorder, unspecified: Secondary | ICD-10-CM | POA: Diagnosis present

## 2016-09-30 DIAGNOSIS — I110 Hypertensive heart disease with heart failure: Secondary | ICD-10-CM | POA: Diagnosis not present

## 2016-09-30 DIAGNOSIS — I2510 Atherosclerotic heart disease of native coronary artery without angina pectoris: Secondary | ICD-10-CM | POA: Diagnosis not present

## 2016-09-30 DIAGNOSIS — W010XXA Fall on same level from slipping, tripping and stumbling without subsequent striking against object, initial encounter: Secondary | ICD-10-CM | POA: Diagnosis present

## 2016-09-30 DIAGNOSIS — M25552 Pain in left hip: Secondary | ICD-10-CM | POA: Diagnosis present

## 2016-09-30 DIAGNOSIS — I480 Paroxysmal atrial fibrillation: Secondary | ICD-10-CM | POA: Diagnosis not present

## 2016-09-30 DIAGNOSIS — Z7982 Long term (current) use of aspirin: Secondary | ICD-10-CM | POA: Diagnosis not present

## 2016-09-30 DIAGNOSIS — Z7902 Long term (current) use of antithrombotics/antiplatelets: Secondary | ICD-10-CM

## 2016-09-30 DIAGNOSIS — E119 Type 2 diabetes mellitus without complications: Secondary | ICD-10-CM | POA: Diagnosis present

## 2016-09-30 DIAGNOSIS — Y92019 Unspecified place in single-family (private) house as the place of occurrence of the external cause: Secondary | ICD-10-CM

## 2016-09-30 DIAGNOSIS — S7222XA Displaced subtrochanteric fracture of left femur, initial encounter for closed fracture: Secondary | ICD-10-CM | POA: Diagnosis not present

## 2016-09-30 DIAGNOSIS — Z955 Presence of coronary angioplasty implant and graft: Secondary | ICD-10-CM | POA: Diagnosis not present

## 2016-09-30 DIAGNOSIS — I5022 Chronic systolic (congestive) heart failure: Secondary | ICD-10-CM | POA: Diagnosis present

## 2016-09-30 DIAGNOSIS — Z7984 Long term (current) use of oral hypoglycemic drugs: Secondary | ICD-10-CM

## 2016-09-30 DIAGNOSIS — Z419 Encounter for procedure for purposes other than remedying health state, unspecified: Secondary | ICD-10-CM

## 2016-09-30 DIAGNOSIS — I700 Atherosclerosis of aorta: Secondary | ICD-10-CM | POA: Diagnosis present

## 2016-09-30 DIAGNOSIS — Z9849 Cataract extraction status, unspecified eye: Secondary | ICD-10-CM | POA: Diagnosis not present

## 2016-09-30 DIAGNOSIS — D649 Anemia, unspecified: Secondary | ICD-10-CM | POA: Diagnosis not present

## 2016-09-30 HISTORY — PX: FEMUR IM NAIL: SHX1597

## 2016-09-30 LAB — GLUCOSE, CAPILLARY
GLUCOSE-CAPILLARY: 104 mg/dL — AB (ref 65–99)
GLUCOSE-CAPILLARY: 145 mg/dL — AB (ref 65–99)

## 2016-09-30 LAB — CBC WITH DIFFERENTIAL/PLATELET
Basophils Absolute: 0 10*3/uL (ref 0.0–0.1)
Basophils Relative: 0 %
EOS ABS: 0 10*3/uL (ref 0.0–0.7)
EOS PCT: 0 %
HCT: 34.7 % — ABNORMAL LOW (ref 36.0–46.0)
Hemoglobin: 11.6 g/dL — ABNORMAL LOW (ref 12.0–15.0)
LYMPHS ABS: 0.9 10*3/uL (ref 0.7–4.0)
Lymphocytes Relative: 9 %
MCH: 30.4 pg (ref 26.0–34.0)
MCHC: 33.4 g/dL (ref 30.0–36.0)
MCV: 91.1 fL (ref 78.0–100.0)
MONO ABS: 0.5 10*3/uL (ref 0.1–1.0)
Monocytes Relative: 6 %
Neutro Abs: 8 10*3/uL — ABNORMAL HIGH (ref 1.7–7.7)
Neutrophils Relative %: 85 %
PLATELETS: 166 10*3/uL (ref 150–400)
RBC: 3.81 MIL/uL — AB (ref 3.87–5.11)
RDW: 15.5 % (ref 11.5–15.5)
WBC: 9.5 10*3/uL (ref 4.0–10.5)

## 2016-09-30 LAB — BASIC METABOLIC PANEL
Anion gap: 11 (ref 5–15)
BUN: 36 mg/dL — AB (ref 6–20)
CHLORIDE: 102 mmol/L (ref 101–111)
CO2: 29 mmol/L (ref 22–32)
CREATININE: 1.37 mg/dL — AB (ref 0.44–1.00)
Calcium: 9 mg/dL (ref 8.9–10.3)
GFR calc Af Amer: 39 mL/min — ABNORMAL LOW (ref >60)
GFR calc non Af Amer: 34 mL/min — ABNORMAL LOW (ref >60)
Glucose, Bld: 159 mg/dL — ABNORMAL HIGH (ref 65–99)
Potassium: 3.1 mmol/L — ABNORMAL LOW (ref 3.5–5.1)
SODIUM: 142 mmol/L (ref 135–145)

## 2016-09-30 LAB — PROTIME-INR
INR: 1.11
PROTHROMBIN TIME: 14.4 s (ref 11.4–15.2)

## 2016-09-30 LAB — CREATININE, SERUM
CREATININE: 1.24 mg/dL — AB (ref 0.44–1.00)
GFR calc non Af Amer: 38 mL/min — ABNORMAL LOW (ref >60)
GFR, EST AFRICAN AMERICAN: 44 mL/min — AB (ref >60)

## 2016-09-30 LAB — CK: Total CK: 79 U/L (ref 38–234)

## 2016-09-30 LAB — I-STAT TROPONIN, ED: Troponin i, poc: 0.01 ng/mL (ref 0.00–0.08)

## 2016-09-30 LAB — ABO/RH: ABO/RH(D): A POS

## 2016-09-30 SURGERY — INSERTION, INTRAMEDULLARY ROD, FEMUR
Anesthesia: General | Laterality: Left

## 2016-09-30 MED ORDER — FUROSEMIDE 20 MG PO TABS
40.0000 mg | ORAL_TABLET | Freq: Two times a day (BID) | ORAL | Status: DC
  Administered 2016-10-01 (×2): 40 mg via ORAL
  Filled 2016-09-30 (×2): qty 1

## 2016-09-30 MED ORDER — MORPHINE SULFATE (PF) 2 MG/ML IV SOLN
2.0000 mg | INTRAVENOUS | Status: DC | PRN
  Administered 2016-10-01 (×2): 2 mg via INTRAVENOUS
  Filled 2016-09-30 (×3): qty 1

## 2016-09-30 MED ORDER — ONDANSETRON HCL 4 MG/2ML IJ SOLN: 4.0000 mg | Freq: Once | INTRAMUSCULAR | Status: DC | PRN

## 2016-09-30 MED ORDER — OMEGA-3-ACID ETHYL ESTERS 1 G PO CAPS
1.0000 g | ORAL_CAPSULE | Freq: Every day | ORAL | Status: DC
  Administered 2016-09-30 – 2016-10-04 (×3): 1 g via ORAL
  Filled 2016-09-30 (×4): qty 1

## 2016-09-30 MED ORDER — LIDOCAINE 2% (20 MG/ML) 5 ML SYRINGE
INTRAMUSCULAR | Status: DC | PRN
  Administered 2016-09-30: 80 mg via INTRAVENOUS

## 2016-09-30 MED ORDER — HYDROCODONE-ACETAMINOPHEN 5-325 MG PO TABS
1.0000 | ORAL_TABLET | Freq: Four times a day (QID) | ORAL | Status: DC | PRN
  Administered 2016-09-30 – 2016-10-01 (×2): 1 via ORAL
  Filled 2016-09-30 (×2): qty 1

## 2016-09-30 MED ORDER — PHENYLEPHRINE 40 MCG/ML (10ML) SYRINGE FOR IV PUSH (FOR BLOOD PRESSURE SUPPORT)
PREFILLED_SYRINGE | INTRAVENOUS | Status: AC
  Filled 2016-09-30: qty 10

## 2016-09-30 MED ORDER — METOCLOPRAMIDE HCL 5 MG PO TABS: 5.0000 mg | ORAL_TABLET | Freq: Three times a day (TID) | ORAL | Status: DC | PRN

## 2016-09-30 MED ORDER — ACETAMINOPHEN 10 MG/ML IV SOLN
1000.0000 mg | Freq: Once | INTRAVENOUS | Status: DC | PRN
  Administered 2016-09-30: 1000 mg via INTRAVENOUS

## 2016-09-30 MED ORDER — ONDANSETRON HCL 4 MG/2ML IJ SOLN
INTRAMUSCULAR | Status: AC
  Filled 2016-09-30: qty 2

## 2016-09-30 MED ORDER — ENOXAPARIN SODIUM 30 MG/0.3ML ~~LOC~~ SOLN: 30.0000 mg | SUBCUTANEOUS | Status: DC

## 2016-09-30 MED ORDER — CEFAZOLIN SODIUM-DEXTROSE 2-4 GM/100ML-% IV SOLN
2.0000 g | INTRAVENOUS | Status: AC
  Administered 2016-09-30: 2 g via INTRAVENOUS

## 2016-09-30 MED ORDER — ALPRAZOLAM 0.25 MG PO TABS
0.2500 mg | ORAL_TABLET | Freq: Four times a day (QID) | ORAL | Status: DC | PRN
  Administered 2016-10-01 – 2016-10-02 (×2): 0.25 mg via ORAL
  Filled 2016-09-30 (×2): qty 1

## 2016-09-30 MED ORDER — MIDAZOLAM HCL 2 MG/2ML IJ SOLN
INTRAMUSCULAR | Status: AC
Start: 2016-09-30 — End: 2016-09-30
  Filled 2016-09-30: qty 2

## 2016-09-30 MED ORDER — ISOSORBIDE MONONITRATE ER 30 MG PO TB24
30.0000 mg | ORAL_TABLET | Freq: Every morning | ORAL | Status: DC
  Administered 2016-10-01 – 2016-10-04 (×3): 30 mg via ORAL
  Filled 2016-09-30 (×3): qty 1

## 2016-09-30 MED ORDER — 0.9 % SODIUM CHLORIDE (POUR BTL) OPTIME
TOPICAL | Status: DC | PRN
  Administered 2016-09-30: 1000 mL

## 2016-09-30 MED ORDER — METHOCARBAMOL 1000 MG/10ML IJ SOLN
500.0000 mg | Freq: Four times a day (QID) | INTRAVENOUS | Status: DC | PRN
  Administered 2016-09-30: 22:00:00 500 mg via INTRAVENOUS
  Filled 2016-09-30 (×2): qty 5

## 2016-09-30 MED ORDER — ASPIRIN EC 81 MG PO TBEC
81.0000 mg | DELAYED_RELEASE_TABLET | Freq: Every day | ORAL | Status: DC
  Administered 2016-10-01: 81 mg via ORAL
  Filled 2016-09-30: qty 1

## 2016-09-30 MED ORDER — PROPOFOL 10 MG/ML IV BOLUS
INTRAVENOUS | Status: DC | PRN
  Administered 2016-09-30: 100 mg via INTRAVENOUS

## 2016-09-30 MED ORDER — SUGAMMADEX SODIUM 200 MG/2ML IV SOLN
INTRAVENOUS | Status: DC | PRN
  Administered 2016-09-30: 150 mg via INTRAVENOUS

## 2016-09-30 MED ORDER — FENTANYL CITRATE (PF) 250 MCG/5ML IJ SOLN
INTRAMUSCULAR | Status: AC
  Filled 2016-09-30: qty 5

## 2016-09-30 MED ORDER — FENTANYL CITRATE (PF) 250 MCG/5ML IJ SOLN
INTRAMUSCULAR | Status: DC | PRN
  Administered 2016-09-30 (×5): 50 ug via INTRAVENOUS

## 2016-09-30 MED ORDER — ACETAMINOPHEN 10 MG/ML IV SOLN
INTRAVENOUS | Status: AC
  Administered 2016-09-30: 1000 mg via INTRAVENOUS
  Filled 2016-09-30: qty 100

## 2016-09-30 MED ORDER — ONDANSETRON HCL 4 MG/2ML IJ SOLN: 4.0000 mg | Freq: Four times a day (QID) | INTRAMUSCULAR | Status: DC | PRN

## 2016-09-30 MED ORDER — LACTATED RINGERS IV SOLN
INTRAVENOUS | Status: DC | PRN
  Administered 2016-09-30: 18:00:00 via INTRAVENOUS

## 2016-09-30 MED ORDER — MORPHINE SULFATE (PF) 2 MG/ML IV SOLN: 0.5000 mg | INTRAVENOUS | Status: DC | PRN

## 2016-09-30 MED ORDER — DOCUSATE SODIUM 100 MG PO CAPS
100.0000 mg | ORAL_CAPSULE | Freq: Two times a day (BID) | ORAL | Status: DC
  Administered 2016-09-30 – 2016-10-04 (×8): 100 mg via ORAL
  Filled 2016-09-30 (×8): qty 1

## 2016-09-30 MED ORDER — ACETAMINOPHEN 650 MG RE SUPP: 650.0000 mg | Freq: Four times a day (QID) | RECTAL | Status: DC | PRN

## 2016-09-30 MED ORDER — MORPHINE SULFATE (PF) 4 MG/ML IV SOLN
4.0000 mg | INTRAVENOUS | Status: DC | PRN
  Administered 2016-09-30: 4 mg via INTRAVENOUS
  Filled 2016-09-30: qty 1

## 2016-09-30 MED ORDER — EPHEDRINE SULFATE-NACL 50-0.9 MG/10ML-% IV SOSY
PREFILLED_SYRINGE | INTRAVENOUS | Status: DC | PRN
  Administered 2016-09-30: 15 mg via INTRAVENOUS

## 2016-09-30 MED ORDER — HYDROCODONE-ACETAMINOPHEN 5-325 MG PO TABS: 1.0000 | ORAL_TABLET | ORAL | Status: DC | PRN

## 2016-09-30 MED ORDER — CEFAZOLIN SODIUM-DEXTROSE 2-4 GM/100ML-% IV SOLN
2.0000 g | Freq: Four times a day (QID) | INTRAVENOUS | Status: AC
  Administered 2016-09-30 – 2016-10-01 (×3): 2 g via INTRAVENOUS
  Filled 2016-09-30 (×3): qty 100

## 2016-09-30 MED ORDER — EPHEDRINE 5 MG/ML INJ
INTRAVENOUS | Status: AC
  Filled 2016-09-30: qty 10

## 2016-09-30 MED ORDER — FENTANYL CITRATE (PF) 100 MCG/2ML IJ SOLN
25.0000 ug | INTRAMUSCULAR | Status: DC | PRN
  Administered 2016-09-30: 50 ug via INTRAVENOUS
  Administered 2016-09-30 (×2): 25 ug via INTRAVENOUS

## 2016-09-30 MED ORDER — LIDOCAINE 2% (20 MG/ML) 5 ML SYRINGE
INTRAMUSCULAR | Status: AC
  Filled 2016-09-30: qty 5

## 2016-09-30 MED ORDER — POVIDONE-IODINE 10 % EX SWAB: 2.0000 "application " | Freq: Once | CUTANEOUS | Status: DC

## 2016-09-30 MED ORDER — METOPROLOL TARTRATE 12.5 MG HALF TABLET
12.5000 mg | ORAL_TABLET | Freq: Two times a day (BID) | ORAL | Status: DC
  Administered 2016-09-30 – 2016-10-04 (×8): 12.5 mg via ORAL
  Filled 2016-09-30 (×8): qty 1

## 2016-09-30 MED ORDER — CEFAZOLIN SODIUM-DEXTROSE 2-4 GM/100ML-% IV SOLN
INTRAVENOUS | Status: AC
  Filled 2016-09-30: qty 100

## 2016-09-30 MED ORDER — AMIODARONE HCL 100 MG PO TABS: 100.0000 mg | ORAL_TABLET | ORAL | Status: DC

## 2016-09-30 MED ORDER — METHOCARBAMOL 500 MG PO TABS
500.0000 mg | ORAL_TABLET | Freq: Four times a day (QID) | ORAL | Status: DC | PRN
  Administered 2016-10-01 – 2016-10-04 (×7): 500 mg via ORAL
  Filled 2016-09-30 (×7): qty 1

## 2016-09-30 MED ORDER — ROCURONIUM BROMIDE 50 MG/5ML IV SOSY
PREFILLED_SYRINGE | INTRAVENOUS | Status: AC
  Filled 2016-09-30: qty 5

## 2016-09-30 MED ORDER — SODIUM CHLORIDE 0.9 % IV SOLN: 30.0000 meq | Freq: Once | INTRAVENOUS | Status: DC

## 2016-09-30 MED ORDER — METOCLOPRAMIDE HCL 5 MG/ML IJ SOLN: 5.0000 mg | Freq: Three times a day (TID) | INTRAMUSCULAR | Status: DC | PRN

## 2016-09-30 MED ORDER — SUGAMMADEX SODIUM 200 MG/2ML IV SOLN
INTRAVENOUS | Status: AC
  Filled 2016-09-30: qty 2

## 2016-09-30 MED ORDER — ONDANSETRON HCL 4 MG PO TABS: 4.0000 mg | ORAL_TABLET | Freq: Four times a day (QID) | ORAL | Status: DC | PRN

## 2016-09-30 MED ORDER — PHENYLEPHRINE HCL 10 MG/ML IJ SOLN
INTRAMUSCULAR | Status: AC
Start: 2016-09-30 — End: 2016-09-30
  Filled 2016-09-30: qty 1

## 2016-09-30 MED ORDER — CLOPIDOGREL BISULFATE 75 MG PO TABS
75.0000 mg | ORAL_TABLET | Freq: Every day | ORAL | Status: DC
  Administered 2016-09-30 – 2016-10-01 (×2): 75 mg via ORAL
  Filled 2016-09-30 (×2): qty 1

## 2016-09-30 MED ORDER — LACTATED RINGERS IV SOLN
INTRAVENOUS | Status: DC
  Administered 2016-09-30 – 2016-10-01 (×2): via INTRAVENOUS

## 2016-09-30 MED ORDER — INSULIN ASPART 100 UNIT/ML ~~LOC~~ SOLN
0.0000 [IU] | Freq: Three times a day (TID) | SUBCUTANEOUS | Status: DC
  Administered 2016-10-02 (×2): 2 [IU] via SUBCUTANEOUS
  Administered 2016-10-03 – 2016-10-04 (×5): 1 [IU] via SUBCUTANEOUS

## 2016-09-30 MED ORDER — PROPOFOL 10 MG/ML IV BOLUS
INTRAVENOUS | Status: AC
  Filled 2016-09-30: qty 20

## 2016-09-30 MED ORDER — NITROGLYCERIN 0.4 MG SL SUBL: 0.4000 mg | SUBLINGUAL_TABLET | SUBLINGUAL | Status: DC | PRN

## 2016-09-30 MED ORDER — POTASSIUM CHLORIDE 10 MEQ/100ML IV SOLN
10.0000 meq | INTRAVENOUS | Status: AC
Start: 2016-09-30 — End: 2016-09-30
  Administered 2016-09-30 (×3): 10 meq via INTRAVENOUS
  Filled 2016-09-30 (×3): qty 100

## 2016-09-30 MED ORDER — MEPERIDINE HCL 50 MG/ML IJ SOLN: 6.2500 mg | INTRAMUSCULAR | Status: DC | PRN

## 2016-09-30 MED ORDER — ATORVASTATIN CALCIUM 40 MG PO TABS
40.0000 mg | ORAL_TABLET | Freq: Every day | ORAL | Status: DC
  Administered 2016-09-30 – 2016-10-04 (×5): 40 mg via ORAL
  Filled 2016-09-30: qty 2
  Filled 2016-09-30 (×2): qty 1
  Filled 2016-09-30: qty 2
  Filled 2016-09-30: qty 1

## 2016-09-30 MED ORDER — FENTANYL CITRATE (PF) 100 MCG/2ML IJ SOLN
INTRAMUSCULAR | Status: AC
  Administered 2016-09-30: 25 ug via INTRAVENOUS
  Filled 2016-09-30: qty 2

## 2016-09-30 MED ORDER — ACETAMINOPHEN 325 MG PO TABS
650.0000 mg | ORAL_TABLET | Freq: Four times a day (QID) | ORAL | Status: DC | PRN
  Filled 2016-09-30: qty 2

## 2016-09-30 MED ORDER — POLYVINYL ALCOHOL 1.4 % OP SOLN
1.0000 [drp] | OPHTHALMIC | Status: DC | PRN
  Filled 2016-09-30: qty 15

## 2016-09-30 MED ORDER — CHLORHEXIDINE GLUCONATE 4 % EX LIQD: 60.0000 mL | Freq: Once | CUTANEOUS | Status: DC

## 2016-09-30 MED ORDER — ONDANSETRON HCL 4 MG/2ML IJ SOLN
INTRAMUSCULAR | Status: DC | PRN
  Administered 2016-09-30: 4 mg via INTRAVENOUS

## 2016-09-30 MED ORDER — ROCURONIUM BROMIDE 10 MG/ML (PF) SYRINGE
PREFILLED_SYRINGE | INTRAVENOUS | Status: DC | PRN
  Administered 2016-09-30: 20 mg via INTRAVENOUS

## 2016-09-30 MED ORDER — MORPHINE SULFATE (PF) 4 MG/ML IV SOLN
4.0000 mg | Freq: Once | INTRAVENOUS | Status: AC
  Administered 2016-09-30: 4 mg via INTRAVENOUS
  Filled 2016-09-30: qty 1

## 2016-09-30 MED ORDER — PHENYLEPHRINE 40 MCG/ML (10ML) SYRINGE FOR IV PUSH (FOR BLOOD PRESSURE SUPPORT)
PREFILLED_SYRINGE | INTRAVENOUS | Status: DC | PRN
Start: 2016-09-30 — End: 2016-09-30
  Administered 2016-09-30: 80 ug via INTRAVENOUS

## 2016-09-30 SURGICAL SUPPLY — 30 items
BAG SPEC THK2 15X12 ZIP CLS (MISCELLANEOUS)
BAG ZIPLOCK 12X15 (MISCELLANEOUS) IMPLANT
BIT DRILL FLUTED FEMUR 4.2/3 (BIT) ×3 IMPLANT
COVER PERINEAL POST (MISCELLANEOUS) ×3 IMPLANT
COVER SURGICAL LIGHT HANDLE (MISCELLANEOUS) ×3 IMPLANT
DRAPE C-ARM 42X120 X-RAY (DRAPES) ×3 IMPLANT
DRAPE INCISE IOBAN 66X45 STRL (DRAPES) ×3 IMPLANT
DRAPE ORTHO SPLIT 77X108 STRL (DRAPES)
DRAPE STERI IOBAN 125X83 (DRAPES) ×3 IMPLANT
DRAPE SURG 17X11 SM STRL (DRAPES) IMPLANT
DRAPE SURG ORHT 6 SPLT 77X108 (DRAPES) IMPLANT
DURAPREP 26ML APPLICATOR (WOUND CARE) ×3 IMPLANT
ELECT REM PT RETURN 15FT ADLT (MISCELLANEOUS) ×3 IMPLANT
GAUZE SPONGE 4X4 12PLY STRL (GAUZE/BANDAGES/DRESSINGS) ×1 IMPLANT
GAUZE XEROFORM 1X8 LF (GAUZE/BANDAGES/DRESSINGS) IMPLANT
GLOVE BIO SURGEON STRL SZ7.5 (GLOVE) ×3 IMPLANT
GLOVE INDICATOR 8.0 STRL GRN (GLOVE) ×3 IMPLANT
GOWN STRL REUS W/TWL LRG LVL3 (GOWN DISPOSABLE) ×3 IMPLANT
GUIDEWIRE 3.2X400 (WIRE) ×2 IMPLANT
KIT BASIN OR (CUSTOM PROCEDURE TRAY) ×3 IMPLANT
MANIFOLD NEPTUNE II (INSTRUMENTS) ×3 IMPLANT
NAIL TROCH FIX 10X235 LT 130 (Nail) ×2 IMPLANT
PACK GENERAL/GYN (CUSTOM PROCEDURE TRAY) ×3 IMPLANT
POSITIONER SURGICAL ARM (MISCELLANEOUS) ×6 IMPLANT
SCREW LOCKING 5.0X32MM (Screw) ×2 IMPLANT
SCREW TFNA 90MM HIP (Orthopedic Implant) ×2 IMPLANT
STAPLER VISISTAT 35W (STAPLE) ×1 IMPLANT
SUT VIC AB 2-0 CT1 27 (SUTURE) ×3
SUT VIC AB 2-0 CT1 TAPERPNT 27 (SUTURE) ×1 IMPLANT
TOWEL OR 17X26 10 PK STRL BLUE (TOWEL DISPOSABLE) ×3 IMPLANT

## 2016-09-30 NOTE — ED Notes (Signed)
ED Provider at bedside. 

## 2016-09-30 NOTE — ED Triage Notes (Signed)
EMS- Fall around 0300 found on the floor. External rotation with shortening of left. 9-10 pain after 150 of fentanyl. Denies LOC or blood thinner use.

## 2016-09-30 NOTE — Consult Note (Addendum)
ORTHOPAEDIC CONSULTATION  REQUESTING PHYSICIAN: Elmarie Shiley, MD  PCP:  Myriam Jacobson, MD  Chief Complaint: Left hip fracture  HPI: Diane Fry is a 83 y.o. female who complains of left hip pain following a fall last night around 1130.  She lives indendently and still works part time at [employer].  She walks without any assistive devices.  She states she fell last night en route to the bathroom and was not able to get up on her own.  She was down until about 3 am when a neighbor had to break her window to enter the home and found her down.  Currently she is complaining of only left hip pain, and denies any distal neurologic deficits or associated symptoms.  She has well controlled type II DM, afib and systolic heart failure.  As well as PMhx listed below.  Past Medical History:  Diagnosis Date  . Anxiety   . Coronary artery disease   . Diabetes mellitus without complication ([place])   . Hypertension   . Pneumonia 2015   Past Surgical History:  Procedure Laterality Date  . CARDIAC CATHETERIZATION N/A 10/31/2014   Procedure: Left Heart Cath and Coronary Angiography;  Surgeon: Charolette Forward, MD;  Location: [facility];  Service: Cardiovascular;  Laterality: N/A;  . CATARACT EXTRACTION    . CORONARY ANGIOPLASTY WITH STENT PLACEMENT  10/31/2014   LAD   . CRANIOTOMY N/A 09/07/2013   Procedure: TRANSSPHENOIDAL RESECTION OF PITUITARY MACROADENOMA WITH ABDOMINAL FAT GRAPH;  Surgeon: Erline Levine, MD;  Location: [facility];  Service: Neurosurgery;  Laterality: N/A;  CRANIOTOMY HYPOPHYSECTOMY TRANSNASAL APPROACH  . PERCUTANEOUS CORONARY STENT INTERVENTION (PCI-S)  10/31/2014   Procedure: Percutaneous Coronary Stent Intervention (Pci-S);  Surgeon: Charolette Forward, MD;  Location: [facility];  Service: Cardiovascular;;  . TRANSNASAL APPROACH N/A 09/07/2013   Procedure: TRANSNASAL APPROACH;  Surgeon: Izora Gala, MD;  Location: [facility];  Service: ENT;  Laterality:  N/A;   Social History   Social History  . Marital status: Widowed    Spouse name: N/A  . Number of children: N/A  . Years of education: N/A   Social History Main Topics  . Smoking status: Never Smoker  . Smokeless tobacco: Never Used  . Alcohol use No  . Drug use: No  . Sexual activity: Not Asked   Other Topics Concern  . None   Social History Narrative  . None   History reviewed. No pertinent family history. No Known Allergies Prior to Admission medications   Medication Sig Start Date End Date Taking? Authorizing Provider  ALPRAZolam (XANAX) 0.25 MG tablet Take 0.25 mg by mouth 4 (four) times daily as needed for anxiety.   Yes Historical Provider, MD  aspirin EC 81 MG tablet Take 81 mg by mouth daily.    Yes Historical Provider, MD  atorvastatin (LIPITOR) 80 MG tablet Take 40 mg by mouth daily.   Yes Historical Provider, MD  clopidogrel (PLAVIX) 75 MG tablet Take 75 mg by mouth daily.   Yes Historical Provider, MD  furosemide (LASIX) 40 MG tablet Take 40-80 mg by mouth 2 (two) times daily. Pt takes two tablets in the morning and one at bedtime.   Yes Historical Provider, MD  HYDROcodone-acetaminophen (NORCO/VICODIN) 5-325 MG tablet Take 1 tablet by mouth 2 (two) times daily as needed for moderate pain.   Yes Historical Provider, MD  levothyroxine (SYNTHROID, LEVOTHROID) 50 MCG tablet Take 50 mcg by mouth daily before breakfast.  Yes Historical Provider, MD  losartan (COZAAR) 50 MG tablet Take 50 mg by mouth daily.   Yes Historical Provider, MD  metFORMIN (GLUCOPHAGE) 500 MG tablet Take 1 tablet (500 mg total) by mouth daily. 11/02/14  Yes Charolette Forward, MD  metoprolol tartrate (LOPRESSOR) 25 MG tablet Take 0.5 tablets (12.5 mg total) by mouth 2 (two) times daily. 11/28/14  Yes Charolette Forward, MD  nitroGLYCERIN (NITROSTAT) 0.4 MG SL tablet Place 1 tablet (0.4 mg total) under the tongue every 5 (five) minutes as needed for chest pain. 11/01/14  Yes Charolette Forward, MD  Omega-3 Fatty  Acids (FISH OIL PO) Take 1 capsule by mouth daily.   Yes Historical Provider, MD  Polyethyl Glycol-Propyl Glycol 0.4-0.3 % SOLN Place 1-2 drops into both eyes every 2 (two) hours as needed (for dry eyes).    Yes Historical Provider, MD  potassium chloride SA (K-DUR,KLOR-CON) 20 MEQ tablet Take 20 mEq by mouth daily.   Yes Historical Provider, MD  triamcinolone cream (KENALOG) 0.1 % Apply 1 application topically 3 (three) times daily as needed (for itching/irritation).   Yes Historical Provider, MD   Dg Chest 1 View  Result Date: 09/30/2016 CLINICAL DATA:  Unwitnessed fall. EXAM: CHEST 1 VIEW COMPARISON:  Radiographs of August 02, 2016. FINDINGS: Stable cardiomediastinal silhouette. Atherosclerosis of thoracic aorta is noted. No pneumothorax or pleural effusion is noted. Mild left lateral basilar opacity is noted concerning for scarring or atelectasis. Focal atelectasis or scarring is noted in right suprahilar region. Bony thorax is unremarkable. IMPRESSION: Aortic atherosclerosis. Probable scarring or atelectasis seen in left basilar and right suprahilar regions. Electronically Signed   By: Marijo Conception, M.D.   On: 09/30/2016 12:25   Dg Knee 1-2 Views Left  Result Date: 09/30/2016 CLINICAL DATA:  Fall with left hip fracture. EXAM: LEFT KNEE - 1-2 VIEW COMPARISON:  None. FINDINGS: Negative for fracture or joint effusion. Normal alignment as permitted by angle of imaging. No notable degenerative changes. Small, incidental bony excrescence from the medial tibial metaphysis. IMPRESSION: Negative. Electronically Signed   By: Monte Fantasia M.D.   On: 09/30/2016 12:21   Dg Tibia/fibula Left  Result Date: 09/30/2016 CLINICAL DATA:  Fall with left leg foreshortening. Shin pain. Initial encounter. EXAM: LEFT TIBIA AND FIBULA - 2 VIEW COMPARISON:  None. FINDINGS: No acute fracture or dislocation.  Osteopenia and atherosclerosis. IMPRESSION: No acute finding. Electronically Signed   By: Monte Fantasia  M.D.   On: 09/30/2016 12:22   Dg Humerus Left  Result Date: 09/30/2016 CLINICAL DATA:  Left arm pain after fall. EXAM: LEFT HUMERUS - 2+ VIEW COMPARISON:  None. FINDINGS: There is no evidence of fracture or other focal bone lesions. Soft tissues are unremarkable. IMPRESSION: Normal left humerus. Electronically Signed   By: Marijo Conception, M.D.   On: 09/30/2016 12:27   Dg Hip Unilat With Pelvis 2-3 Views Left  Result Date: 09/30/2016 CLINICAL DATA:  Fall with left hip pain.  Initial encounter. EXAM: DG HIP (WITH OR WITHOUT PELVIS) 2-3V LEFT COMPARISON:  None. FINDINGS: Acute intertrochanteric left femur fracture with varus angulation. Both hips are located. No evidence of pelvic ring fracture. Osteopenia and atherosclerosis. IMPRESSION: Acute intertrochanteric left femur fracture with varus angulation. Electronically Signed   By: Monte Fantasia M.D.   On: 09/30/2016 12:20    Positive ROS: All other systems have been reviewed and were otherwise negative with the exception of those mentioned in the HPI and as above.  Physical Exam: General: Alert, no  acute distress Cardiovascular: No pedal edema Respiratory: No cyanosis, no use of accessory musculature GI: No organomegaly, abdomen is soft and non-tender Skin: No lesions in the area of chief complaint Neurologic: Sensation intact distally Psychiatric: Patient is competent for consent with normal mood and affect Lymphatic: No axillary or cervical lymphadenopathy  MUSCULOSKELETAL:   Left LE- leg held shortened and ER , TTP over greater troch, otherwise distally she endorses SILT sp/dp/sur/saph/tibial n, motor intact with TA/GSC/FHL/EHL, 2+ DP pulse  Assessment: Left hip IT fracture  Plan: -to OR for IMN of left hip due to unstable fracture pattern -she has been preoperatively seen and evaluated by the IM team and is stable for surgery. -The risks, benefits, and alternatives were discussed with the patient. There are risks associated with  the surgery including, but not limited to, problems with anesthesia (death), infection, differences in leg length/angulation/rotation, fracture of bones, loosening or failure of implants, malunion, nonunion, hematoma (blood accumulation) which may require surgical drainage, blood clots, pulmonary embolism, nerve injury (foot drop), and blood vessel injury. The patient understands these risks and elects to proceed. -all questions from her daughter and the patient were answered at the bedside -admit to medicine post op for dc planning and medical management -    Nicholes Stairs, MD Cell [phone]    09/30/2016 5:38 PM

## 2016-09-30 NOTE — Op Note (Addendum)
Date of Surgery: 09/30/2016  INDICATIONS: Diane Fry is a 83 y.o.-year-old female who sustained a left hip fracture. She lives independently and works part time prior to this injury.  She does not require the aid of assistance for ambulation.  Due to the displaced nature of the fracture she was indicated for surgical management.  The risks and benefits of the procedure discussed with the patient and family prior to the procedure and all questions were answered; consent was obtained.  PREOPERATIVE DIAGNOSIS: left hip fracture   POSTOPERATIVE DIAGNOSIS: Same   PROCEDURE: Treatment of intertrochanteric, pertrochanteric, subtrochanteric fracture with intramedullary implant. CPT [phone]   SURGEON: Dannielle Karvonen. Diane Fry, M.D.   ANESTHESIA: general   IV FLUIDS AND URINE: See anesthesia record   ESTIMATED BLOOD LOSS: 100 cc  IMPLANTS:  Synthes TFN A Intermediate length  90 mm compression screw 32 mm distal interlock  DRAINS: None.   COMPLICATIONS: None.   DESCRIPTION OF PROCEDURE: The patient was brought to the operating room and placed supine on the operating table. The patient's leg had been signed prior to the procedure. The patient had the anesthesia placed by the anesthesiologist. The prep verification and incision time-outs were performed to confirm that this was the correct patient, site, side and location. The patient had an SCD on the opposite lower extremity. The patient did receive antibiotics prior to the incision and was re-dosed during the procedure as needed at indicated intervals. The patient was positioned on the Hana table with the table in traction and internal rotation to reduce the hip. The well leg was placed in a scissor position and all bony prominences were well-padded. The patient had the lower extremity prepped and draped in the standard surgical fashion. The incision was made 4 finger breadths superior to the greater trochanter. A guide pin was inserted into the tip of the  greater trochanter under fluoroscopic guidance. An opening reamer was used to gain access to the femoral canal. The nail length was measured and inserted down the femoral canal to its proper depth. The appropriate version of insertion for the lag screw was found under fluoroscopy. A pin was inserted up the femoral neck through the jig. An antirotation pin was placed through the jig into the head during the placement of the lag screw.  The lag screw was inserted as near to center-center in the head as possible. The antirotation pin was then taken out and an interdigitating compression screw was placed in its place. The leg was taken out of traction, compression was visualized on serial xrays. The wound was copiously irrigated with saline and the subcutaneous layer closed with 2.0 vicryl and the skin was reapproximated with staples. The wounds were cleaned and dried a final time and a sterile dressing was placed. The hip was taken through a range of motion at the end of the case under fluoroscopic imaging to visualize the approach-withdraw phenomenon and confirm implant length in the head. The patient was then awakened from anesthesia and taken to the recovery room in stable condition. All counts were correct at the end of the case. There were no complications.  POSTOPERATIVE PLAN: The patient will be weight bearing as tolerated and will return in 2 weeks for staple removal and the patient will receive DVT prophylaxis based on other medications, activity level, and risk ratio of bleeding to thrombosis.  She is on plavix and 81 mg asa at baseline and we will resume these for DVT ppx.   Diane Fry  Diane Fry, [place] [phone] 7:27 PM

## 2016-09-30 NOTE — H&P (Addendum)
History and Physical    Diane Fry YKZ:[id number] DOB: [birth year] DOA: 09/30/2016  PCP: Myriam Jacobson, MD  Patient coming from: Home   I have personally briefly reviewed patient's old medical records in [place]  Chief Complaint: fall, left hip pain.   HPI: Diane Fry is a 83 y.o. female with medical history significant of CAD, S/P Stent 2016, anxiety, DM, PAF,  who presents after a mechanical fall. She stands up to go to bathroom  when she slipped  and fell on her left side. She was not able to stands and she was on the floor for many hours, until she was able to call for help. She denies chest pain or dyspnea prior falling. She denies chest pain or dyspnea on ambulation. She denies chest pain at rest. She is having pain left hip.    ED Course: hb 11, Cr 1.3, BUN 36, k at 3.1,  Hip x ray: Acute intertrochanteric left femur fracture with varus angulation. Chest x ray: Aortic atherosclerosis. Probable scarring or atelectasis seen in left basilar and right suprahilar regions.  Review of Systems: As per HPI otherwise 10 point review of systems negative.    Past Medical History:  Diagnosis Date  . Anxiety   . Coronary artery disease   . Diabetes mellitus without complication ([place])   . Hypertension   . Pneumonia 2015    Past Surgical History:  Procedure Laterality Date  . CARDIAC CATHETERIZATION N/A 10/31/2014   Procedure: Left Heart Cath and Coronary Angiography;  Surgeon: Charolette Forward, MD;  Location: [facility];  Service: Cardiovascular;  Laterality: N/A;  . CATARACT EXTRACTION    . CORONARY ANGIOPLASTY WITH STENT PLACEMENT  10/31/2014   LAD   . CRANIOTOMY N/A 09/07/2013   Procedure: TRANSSPHENOIDAL RESECTION OF PITUITARY MACROADENOMA WITH ABDOMINAL FAT GRAPH;  Surgeon: Erline Levine, MD;  Location: [facility];  Service: Neurosurgery;  Laterality: N/A;  CRANIOTOMY HYPOPHYSECTOMY TRANSNASAL APPROACH  . PERCUTANEOUS CORONARY STENT INTERVENTION (PCI-S)   10/31/2014   Procedure: Percutaneous Coronary Stent Intervention (Pci-S);  Surgeon: Charolette Forward, MD;  Location: [facility];  Service: Cardiovascular;;  . TRANSNASAL APPROACH N/A 09/07/2013   Procedure: TRANSNASAL APPROACH;  Surgeon: Izora Gala, MD;  Location: [facility];  Service: ENT;  Laterality: N/A;     reports that she has never smoked. She has never used smokeless tobacco. She reports that she does not drink alcohol or use drugs.  No Known Allergies  Family history; unknown.    Prior to Admission medications   Medication Sig Start Date End Date Taking? Authorizing Provider  ALPRAZolam (XANAX) 0.25 MG tablet Take 0.25 mg by mouth 4 (four) times daily as needed for anxiety. 09/01/16  Yes Historical Provider, MD  aspirin EC 81 MG tablet Take 81 mg by mouth daily.    Yes Historical Provider, MD  losartan (COZAAR) 50 MG tablet Take 50 mg by mouth daily. 08/10/16  Yes Historical Provider, MD  Omega-3 Fatty Acids (FISH OIL PO) Take 1 capsule by mouth daily.   Yes Historical Provider, MD  potassium chloride SA (K-DUR,KLOR-CON) 20 MEQ tablet Take 20 mEq by mouth daily. 08/10/16  Yes Historical Provider, MD  amiodarone (PACERONE) 200 MG tablet Take 100 mg by mouth daily.    Historical Provider, MD  atorvastatin (LIPITOR) 80 MG tablet Take 40 mg by mouth daily. 08/21/13   Historical Provider, MD  clopidogrel (PLAVIX) 75 MG tablet Take 75 mg by mouth daily.  Historical Provider, MD  furosemide (LASIX) 40 MG tablet Take 1 tablet (40 mg total) by mouth 2 (two) times daily. 11/28/14   Charolette Forward, MD  isosorbide mononitrate (IMDUR) 30 MG 24 hr tablet Take 30 mg by mouth every morning. 10/11/14   Historical Provider, MD  levothyroxine (SYNTHROID, LEVOTHROID) 50 MCG tablet Take 50 mcg by mouth daily. 08/23/13   Historical Provider, MD  metFORMIN (GLUCOPHAGE) 500 MG tablet Take 1 tablet (500 mg total) by mouth daily. 11/02/14   Charolette Forward, MD  metoprolol tartrate (LOPRESSOR) 25 MG tablet Take  0.5 tablets (12.5 mg total) by mouth 2 (two) times daily. 11/28/14   Charolette Forward, MD  nitroGLYCERIN (NITROSTAT) 0.4 MG SL tablet Place 1 tablet (0.4 mg total) under the tongue every 5 (five) minutes as needed for chest pain. 11/01/14   Charolette Forward, MD  Polyethyl Glycol-Propyl Glycol 0.4-0.3 % SOLN Apply 1 drop to eye as needed (dry eyes).     Historical Provider, MD  potassium chloride 20 MEQ TBCR Take 20 mEq by mouth daily. 11/28/14   Charolette Forward, MD    Physical Exam: Vitals:   09/30/16 1330 09/30/16 1430 09/30/16 1530 09/30/16 1600  BP: (!) 127/44 (!) 111/52 (!) 127/57 123/63  Pulse: (!) 57 74 72 73  Resp: 17 17 17 11   Temp:    98.6 F (37 C)  TempSrc:    Oral  SpO2: (!) 81% 96% 93% 98%  Weight:    76.7 kg (169 lb)  Height:    5\' 3"  (1.6 m)    Constitutional: NAD, calm, comfortable Vitals:   09/30/16 1330 09/30/16 1430 09/30/16 1530 09/30/16 1600  BP: (!) 127/44 (!) 111/52 (!) 127/57 123/63  Pulse: (!) 57 74 72 73  Resp: 17 17 17 11   Temp:    98.6 F (37 C)  TempSrc:    Oral  SpO2: (!) 81% 96% 93% 98%  Weight:    76.7 kg (169 lb)  Height:    5\' 3"  (1.6 m)   Eyes: PERRL, lids and conjunctivae normal ENMT: Mucous membranes are moist. Posterior pharynx clear of any exudate or lesions.Normal dentition.  Neck: normal, supple, no masses, no thyromegaly Respiratory: clear to auscultation bilaterally, no wheezing, no crackles. Normal respiratory effort. No accessory muscle use.  Cardiovascular: Regular rate and rhythm, no murmurs / rubs / gallops. No extremity edema. 2+ pedal pulses. No carotid bruits.  Abdomen: no tenderness, no masses palpated. No hepatosplenomegaly. Bowel sounds positive.  Musculoskeletal: no clubbing / cyanosis. Left LE externally rotated and shorter than right.  Skin: no rashes, lesions, ulcers. No induration Neurologic: CN 2-12 grossly intact. Sensation intact, DTR normal. Strength 5/5 in all except on left LE. No evaluated due to pain. Marland Kitchen  Psychiatric:  Normal judgment and insight. Alert and oriented x 3. Normal mood.    Labs on Admission: I have personally reviewed following labs and imaging studies  CBC:  Recent Labs Lab 09/30/16 1233  WBC 9.5  NEUTROABS 8.0*  HGB 11.6*  HCT 34.7*  MCV 91.1  PLT 009   Basic Metabolic Panel:  Recent Labs Lab 09/30/16 1233  NA 142  K 3.1*  CL 102  CO2 29  GLUCOSE 159*  BUN 36*  CREATININE 1.37*  CALCIUM 9.0   GFR: Estimated Creatinine Clearance: 28.9 mL/min (A) (by C-G formula based on SCr of 1.37 mg/dL (H)). Liver Function Tests: No results for input(s): AST, ALT, ALKPHOS, BILITOT, PROT, ALBUMIN in the last 168 hours. No results for input(s):  LIPASE, AMYLASE in the last 168 hours. No results for input(s): AMMONIA in the last 168 hours. Coagulation Profile:  Recent Labs Lab 09/30/16 1233  INR 1.11   Cardiac Enzymes:  Recent Labs Lab 09/30/16 1233  CKTOTAL 79   BNP (last 3 results) No results for input(s): PROBNP in the last 8760 hours. HbA1C: No results for input(s): HGBA1C in the last 72 hours. CBG: No results for input(s): GLUCAP in the last 168 hours. Lipid Profile: No results for input(s): CHOL, HDL, LDLCALC, TRIG, CHOLHDL, LDLDIRECT in the last 72 hours. Thyroid Function Tests: No results for input(s): TSH, T4TOTAL, FREET4, T3FREE, THYROIDAB in the last 72 hours. Anemia Panel: No results for input(s): VITAMINB12, FOLATE, FERRITIN, TIBC, IRON, RETICCTPCT in the last 72 hours. Urine analysis:    Component Value Date/Time   COLORURINE YELLOW 11/24/2014 1822   APPEARANCEUR CLEAR 11/24/2014 1822   LABSPEC 1.005 11/24/2014 1822   PHURINE 7.5 11/24/2014 1822   GLUCOSEU NEGATIVE 11/24/2014 1822   HGBUR NEGATIVE 11/24/2014 1822   BILIRUBINUR NEGATIVE 11/24/2014 1822   KETONESUR NEGATIVE 11/24/2014 1822   PROTEINUR NEGATIVE 11/24/2014 1822   UROBILINOGEN 0.2 11/24/2014 1822   NITRITE NEGATIVE 11/24/2014 1822   LEUKOCYTESUR NEGATIVE 11/24/2014 1822     Radiological Exams on Admission: Dg Chest 1 View  Result Date: 09/30/2016 CLINICAL DATA:  Unwitnessed fall. EXAM: CHEST 1 VIEW COMPARISON:  Radiographs of August 02, 2016. FINDINGS: Stable cardiomediastinal silhouette. Atherosclerosis of thoracic aorta is noted. No pneumothorax or pleural effusion is noted. Mild left lateral basilar opacity is noted concerning for scarring or atelectasis. Focal atelectasis or scarring is noted in right suprahilar region. Bony thorax is unremarkable. IMPRESSION: Aortic atherosclerosis. Probable scarring or atelectasis seen in left basilar and right suprahilar regions. Electronically Signed   By: Marijo Conception, M.D.   On: 09/30/2016 12:25   Dg Knee 1-2 Views Left  Result Date: 09/30/2016 CLINICAL DATA:  Fall with left hip fracture. EXAM: LEFT KNEE - 1-2 VIEW COMPARISON:  None. FINDINGS: Negative for fracture or joint effusion. Normal alignment as permitted by angle of imaging. No notable degenerative changes. Small, incidental bony excrescence from the medial tibial metaphysis. IMPRESSION: Negative. Electronically Signed   By: Monte Fantasia M.D.   On: 09/30/2016 12:21   Dg Tibia/fibula Left  Result Date: 09/30/2016 CLINICAL DATA:  Fall with left leg foreshortening. Shin pain. Initial encounter. EXAM: LEFT TIBIA AND FIBULA - 2 VIEW COMPARISON:  None. FINDINGS: No acute fracture or dislocation.  Osteopenia and atherosclerosis. IMPRESSION: No acute finding. Electronically Signed   By: Monte Fantasia M.D.   On: 09/30/2016 12:22   Dg Humerus Left  Result Date: 09/30/2016 CLINICAL DATA:  Left arm pain after fall. EXAM: LEFT HUMERUS - 2+ VIEW COMPARISON:  None. FINDINGS: There is no evidence of fracture or other focal bone lesions. Soft tissues are unremarkable. IMPRESSION: Normal left humerus. Electronically Signed   By: Marijo Conception, M.D.   On: 09/30/2016 12:27   Dg Hip Unilat With Pelvis 2-3 Views Left  Result Date: 09/30/2016 CLINICAL DATA:  Fall with  left hip pain.  Initial encounter. EXAM: DG HIP (WITH OR WITHOUT PELVIS) 2-3V LEFT COMPARISON:  None. FINDINGS: Acute intertrochanteric left femur fracture with varus angulation. Both hips are located. No evidence of pelvic ring fracture. Osteopenia and atherosclerosis. IMPRESSION: Acute intertrochanteric left femur fracture with varus angulation. Electronically Signed   By: Monte Fantasia M.D.   On: 09/30/2016 12:20    EKG: Independently reviewed. Sinus , prolong  QT  Assessment/Plan Active Problems:   Coronary atherosclerosis of native coronary artery   Type 2 diabetes mellitus (HCC)   Paroxysmal atrial fibrillation (HCC)   Closed left hip fracture, initial encounter ([facility]CC)  1-Left Hip fracture, close.  Admit to med-surgery. Patient denies chest pain or dyspnea. She is at moderate to high risk due to multiple comorbidity. Patient agrees to proceed with procedure.  ECHO 2016; Ef 60 % CAD S/P stents 2016 osteal and proximal LAD.  Plan for sx this afternoon.  Pain management, bowel regimen ordered.   2-Hypothyroidism;  Continue with synthroid.   3-CAD; continue with plavix, aspirin and statins.   4-HTN; hold cozaar pre op.  5-DM; hold metformin, will order SSI.  6-Chronic Diastolic heart Failure. On lasix 80 mg  in am and 40 mg  at night. Will resume 40 mg BID starting 4-13, monitor renal function.  7-PAF; on amiodarone 100 mg Twice a week.   DVT prophylaxis:  Lovenox starting 4-13 Code Status:  Full code.  Family Communication: care discussed with patient.  Disposition Plan: likely will need rehab.  Consults called: Ortho  Admission status: inpatient, med-surgery   Elmarie Shiley MD [facility] Pager [phone]  If 7PM-7AM, please contact night-coverage [email or web address] Password [facility]  09/30/2016, 5:31 PM

## 2016-09-30 NOTE — Anesthesia Preprocedure Evaluation (Addendum)
Anesthesia Evaluation  Patient identified by MRN, date of birth, ID band Patient awake    Reviewed: Allergy & Precautions, NPO status , Patient's Chart, lab work & pertinent test results, reviewed documented beta blocker date and time   Airway Mallampati: II       Dental  (+) Edentulous Upper, Edentulous Lower, Dental Advisory Given   Pulmonary    Pulmonary exam normal        Cardiovascular hypertension, Pt. on home beta blockers + Cardiac Stents   Rhythm:Irregular Rate:Normal     Neuro/Psych PSYCHIATRIC DISORDERS Anxiety    GI/Hepatic negative GI ROS, Neg liver ROS,   Endo/Other  diabetes, Type 2, Oral Hypoglycemic Agents  Renal/GU Renal InsufficiencyRenal disease     Musculoskeletal negative musculoskeletal ROS (+)   Abdominal Normal abdominal exam  (+)   Peds  Hematology   Anesthesia Other Findings Study Result   Result status: Edited Result - FINAL                             *[facility]*                         [address], [place] [address]                            [phone]  ------------------------------------------------------------------- Transthoracic Echocardiography  Patient:    Shery, [place] MR #:       [id number] Study Date: 11/25/2014 Gender:     F Age:        83 Height:     162.6 cm Weight:     78 kg BSA:        1.9 m^2 Pt. Status: Room:       [place]   ADMITTING    Dixie Dials, MD  ORDERING     Dixie Dials, MD  PERFORMING   Dixie Dials, MD  [place], MD  [place], [place]  SONOGRAPHER  Darlina Sicilian, RDCS  cc:  ------------------------------------------------------------------- LV EF: 55% -   60%  ------------------------------------------------------------------- Indications:      CHF -  428.0.  ------------------------------------------------------------------- History:   PMH:   Dyspnea and hypotension.  Atrial fibrillation. Coronary artery disease.  Risk factors:  Hypertension. Diabetes mellitus. Dyslipidemia.  ------------------------------------------------------------------- Study Conclusions  - Left ventricle: The cavity size was normal. Systolic function was   normal. The estimated ejection fraction was in the range of 55%   to 60%. Possible of the apicalapical myocardium. Doppler   parameters are consistent with abnormal left ventricular   relaxation (grade 1 diastolic dysfunction). - Aortic valve: Valve area (VTI): 1.69 cm^2. Valve area (Vmax):   1.28 cm^2. Valve area (Vmean): 1.38 cm^2. - Pulmonary arteries: Systolic pressure was moderately increased.   PA peak pressure: 50 mm Hg (S). Cardiac catheterization  Order# [id number]  Reading physician: Charolette Forward, MD Ordering physician: Charolette Forward, MD Study date: 10/31/14 Physicians   Panel Physicians Referring Physician Case Authorizing Physician Charolette Forward, MD (Primary)   Procedures   Left Heart Cath and Coronary Angiography Conclusion  Ost 1st Mrg to 1st Mrg lesion, 60% stenosed.  Prox LAD to Dist LAD lesion, 100% stenosed. There is a 0% residual stenosis post intervention.  A drug-eluting stent was placed.  Ost LAD to Prox LAD lesion, 95% stenosed. There is a 0% residual stenosis post intervention.  A drug-eluting stent was placed.   Successful PTCA stenting 2 chronically occluded LAD was done as per procedure report patient had excellent angiographic result. Patient tolerated procedure well Recommendation As per orders Patient will be on dual antiplatelet medication for rest of her life.     Reproductive/Obstetrics                           Anesthesia Physical Anesthesia Plan  ASA: II  Anesthesia Plan: General   Post-op Pain Management:     Induction: Intravenous  Airway Management Planned: Oral ETT  Additional Equipment:   Intra-op Plan:   Post-operative Plan: Extubation in OR  Informed Consent: I have reviewed the patients History and Physical, chart, labs and discussed the procedure including the risks, benefits and alternatives for the proposed anesthesia with the patient or authorized representative who has indicated his/her understanding and acceptance.   Dental advisory given  Plan Discussed with: CRNA and Surgeon  Anesthesia Plan Comments:         Anesthesia Quick Evaluation

## 2016-09-30 NOTE — ED Provider Notes (Signed)
[facility] DEPT Provider Note   CSN: [id number] Arrival date & time: 09/30/16  1100     History   Chief Complaint Chief Complaint  Patient presents with  . Fall  . Hip Pain    HPI Diane Fry is a 83 y.o. female.  The history is provided by the patient and medical records.  Fall   Hip Pain     83 year old female with history of anxiety, coronary artery disease, diabetes, hypertension, congestive heart failure, paroxysmal A. fib on aspirin, presenting to the ED after a fall. Patient reports she got up to use the bathroom around 3 AM. States she just slipped on some socks quickly but did not put on her slippers. States on the way to the bathroom she slipped and fell onto her left side. She denies any head injury or loss of consciousness. States he eventually was able to call EMS this morning after wiggling to get her phone.  States she has pain throughout the left leg, worse in the left hip, knee, and lower leg. She denies any numbness or weakness, but does report a lot of difficulty trying to move her left leg.  States she has no prior history of left hip fractures or injuries in the past. Patient is from home, generally ambulates unassisted.  Past Medical History:  Diagnosis Date  . Anxiety   . Coronary artery disease   . Diabetes mellitus without complication ([place])   . Hypertension   . Pneumonia 2015    Patient Active Problem List   Diagnosis Date Noted  . Acute on chronic left systolic heart failure ([place]) 11/24/2014  . New-onset angina ([place]) 10/31/2014  . Paroxysmal atrial fibrillation ([place]) 05/15/2014  . Community acquired bacterial pneumonia 05/10/2014  . Coronary atherosclerosis of native coronary artery 05/10/2014  . Type 2 diabetes mellitus ([place]) 05/10/2014  . Hypotension 05/10/2014  . Acute kidney injury ([place]) 05/10/2014  . Pituitary macroadenoma with extrasellar extension ([place]) 09/07/2013  . Pituitary macroadenoma ([place]) 09/07/2013    Past Surgical  History:  Procedure Laterality Date  . CARDIAC CATHETERIZATION N/A 10/31/2014   Procedure: Left Heart Cath and Coronary Angiography;  Surgeon: Charolette Forward, MD;  Location: [facility];  Service: Cardiovascular;  Laterality: N/A;  . CATARACT EXTRACTION    . CORONARY ANGIOPLASTY WITH STENT PLACEMENT  10/31/2014   LAD   . CRANIOTOMY N/A 09/07/2013   Procedure: TRANSSPHENOIDAL RESECTION OF PITUITARY MACROADENOMA WITH ABDOMINAL FAT GRAPH;  Surgeon: Erline Levine, MD;  Location: [facility];  Service: Neurosurgery;  Laterality: N/A;  CRANIOTOMY HYPOPHYSECTOMY TRANSNASAL APPROACH  . PERCUTANEOUS CORONARY STENT INTERVENTION (PCI-S)  10/31/2014   Procedure: Percutaneous Coronary Stent Intervention (Pci-S);  Surgeon: Charolette Forward, MD;  Location: [facility];  Service: Cardiovascular;;  . TRANSNASAL APPROACH N/A 09/07/2013   Procedure: TRANSNASAL APPROACH;  Surgeon: Izora Gala, MD;  Location: [facility];  Service: ENT;  Laterality: N/A;    OB History    No data available       Home Medications    Prior to Admission medications   Medication Sig Start Date End Date Taking? Authorizing Provider  amiodarone (PACERONE) 200 MG tablet Take 100 mg by mouth daily.    Historical Provider, MD  aspirin EC 81 MG tablet Take 162 mg by mouth daily.     Historical Provider, MD  atorvastatin (LIPITOR) 80 MG tablet Take 40 mg by mouth daily. 08/21/13   Historical Provider, MD  clopidogrel (PLAVIX) 75 MG tablet Take 75  mg by mouth daily.    Historical Provider, MD  furosemide (LASIX) 40 MG tablet Take 1 tablet (40 mg total) by mouth 2 (two) times daily. 11/28/14   Charolette Forward, MD  isosorbide mononitrate (IMDUR) 30 MG 24 hr tablet Take 30 mg by mouth every morning. 10/11/14   Historical Provider, MD  levothyroxine (SYNTHROID, LEVOTHROID) 50 MCG tablet Take 50 mcg by mouth daily. 08/23/13   Historical Provider, MD  losartan (COZAAR) 25 MG tablet Take 1 tablet (25 mg total) by mouth daily. 11/28/14   Charolette Forward, MD  metFORMIN (GLUCOPHAGE) 500 MG tablet Take 1 tablet (500 mg total) by mouth daily. 11/02/14   Charolette Forward, MD  metoprolol tartrate (LOPRESSOR) 25 MG tablet Take 0.5 tablets (12.5 mg total) by mouth 2 (two) times daily. 11/28/14   Charolette Forward, MD  nitroGLYCERIN (NITROSTAT) 0.4 MG SL tablet Place 1 tablet (0.4 mg total) under the tongue every 5 (five) minutes as needed for chest pain. 11/01/14   Charolette Forward, MD  Polyethyl Glycol-Propyl Glycol 0.4-0.3 % SOLN Apply 1 drop to eye as needed (dry eyes).     Historical Provider, MD  potassium chloride 20 MEQ TBCR Take 20 mEq by mouth daily. 11/28/14   Charolette Forward, MD    Family History No family history on file.  Social History Social History  Substance Use Topics  . Smoking status: Never Smoker  . Smokeless tobacco: Never Used  . Alcohol use No     Allergies   Patient has no known allergies.   Review of Systems Review of Systems  Musculoskeletal: Positive for arthralgias.  All other systems reviewed and are negative.    Physical Exam Updated Vital Signs Ht 5\' 3"  (1.6 m)   Wt 76.7 kg   BMI 29.94 kg/m   Physical Exam  Constitutional: She is oriented to person, place, and time. She appears well-developed and well-nourished.  HENT:  Head: Normocephalic and atraumatic.  Mouth/Throat: Oropharynx is clear and moist.  Eyes: Conjunctivae and EOM are normal. Pupils are equal, round, and reactive to light.  Neck: Normal range of motion.  Cardiovascular: Normal rate, regular rhythm and normal heart sounds.   Pulmonary/Chest: Effort normal and breath sounds normal. She has no wheezes. She has no rhonchi.  Lungs clear, no distress, speaking in full sentences without difficulty  Abdominal: Soft. Bowel sounds are normal.  Musculoskeletal: Normal range of motion.  Left leg is shortened and externally rotated when compared with right, there is abrasion and small amount of bruising to the left hip, left knee, and left shin; DP  pulse intact of left foot, normal sensation, able to move all toes normally Large bruise of the left humerus that is mildly tender, there is no gross deformity, full range of motion of the elbow  Neurological: She is alert and oriented to person, place, and time.  Skin: Skin is warm and dry.  Psychiatric: She has a normal mood and affect.  Nursing note and vitals reviewed.    ED Treatments / Results  Labs (all labs ordered are listed, but only abnormal results are displayed) Labs Reviewed  BASIC METABOLIC PANEL - Abnormal; Notable for the following:       Result Value   Potassium 3.1 (*)    Glucose, Bld 159 (*)    BUN 36 (*)    Creatinine, Ser 1.37 (*)    GFR calc non Af Amer 34 (*)    GFR calc Af Amer 39 (*)    All  other components within normal limits  CBC WITH DIFFERENTIAL/PLATELET - Abnormal; Notable for the following:    RBC 3.81 (*)    Hemoglobin 11.6 (*)    HCT 34.7 (*)    Neutro Abs 8.0 (*)    All other components within normal limits  PROTIME-INR  CK  I-STAT TROPOININ, ED  TYPE AND SCREEN  ABO/RH    EKG  EKG Interpretation None       Radiology Dg Chest 1 View  Result Date: 09/30/2016 CLINICAL DATA:  Unwitnessed fall. EXAM: CHEST 1 VIEW COMPARISON:  Radiographs of August 02, 2016. FINDINGS: Stable cardiomediastinal silhouette. Atherosclerosis of thoracic aorta is noted. No pneumothorax or pleural effusion is noted. Mild left lateral basilar opacity is noted concerning for scarring or atelectasis. Focal atelectasis or scarring is noted in right suprahilar region. Bony thorax is unremarkable. IMPRESSION: Aortic atherosclerosis. Probable scarring or atelectasis seen in left basilar and right suprahilar regions. Electronically Signed   By: Marijo Conception, M.D.   On: 09/30/2016 12:25   Dg Knee 1-2 Views Left  Result Date: 09/30/2016 CLINICAL DATA:  Fall with left hip fracture. EXAM: LEFT KNEE - 1-2 VIEW COMPARISON:  None. FINDINGS: Negative for fracture or joint  effusion. Normal alignment as permitted by angle of imaging. No notable degenerative changes. Small, incidental bony excrescence from the medial tibial metaphysis. IMPRESSION: Negative. Electronically Signed   By: Monte Fantasia M.D.   On: 09/30/2016 12:21   Dg Tibia/fibula Left  Result Date: 09/30/2016 CLINICAL DATA:  Fall with left leg foreshortening. Shin pain. Initial encounter. EXAM: LEFT TIBIA AND FIBULA - 2 VIEW COMPARISON:  None. FINDINGS: No acute fracture or dislocation.  Osteopenia and atherosclerosis. IMPRESSION: No acute finding. Electronically Signed   By: Monte Fantasia M.D.   On: 09/30/2016 12:22   Dg Humerus Left  Result Date: 09/30/2016 CLINICAL DATA:  Left arm pain after fall. EXAM: LEFT HUMERUS - 2+ VIEW COMPARISON:  None. FINDINGS: There is no evidence of fracture or other focal bone lesions. Soft tissues are unremarkable. IMPRESSION: Normal left humerus. Electronically Signed   By: Marijo Conception, M.D.   On: 09/30/2016 12:27   Dg Hip Unilat With Pelvis 2-3 Views Left  Result Date: 09/30/2016 CLINICAL DATA:  Fall with left hip pain.  Initial encounter. EXAM: DG HIP (WITH OR WITHOUT PELVIS) 2-3V LEFT COMPARISON:  None. FINDINGS: Acute intertrochanteric left femur fracture with varus angulation. Both hips are located. No evidence of pelvic ring fracture. Osteopenia and atherosclerosis. IMPRESSION: Acute intertrochanteric left femur fracture with varus angulation. Electronically Signed   By: Monte Fantasia M.D.   On: 09/30/2016 12:20    Procedures Procedures (including critical care time)  Medications Ordered in ED Medications  morphine 4 MG/ML injection 4 mg (4 mg Intravenous Given 09/30/16 1239)     Initial Impression / Assessment and Plan / ED Course  I have reviewed the triage vital signs and the nursing notes.  Pertinent labs & imaging results that were available during my care of the patient were reviewed by me and considered in my medical decision making (see  chart for details).  83 year old female here after a fall. Reports she slipped and fell in her socks around 3 AM, unable to get out of the floor so laid there all morning. Here she is awake, alert, properly oriented. She denies any head injury or loss of consciousness. Reports pain in the left hip stemming throughout the left leg. She does have some bruises and abrasions noted  as well as left leg shortening and internal rotation.  Also has a large bruise of the left upper arm. No visible signs of head trauma. Screening x-rays obtained confirming acute intertrochanteric left femur fracture with angulation.  All other imaging is negative.    Discussed case with orthopedics on call, Dr. Stann Mainland-- keep NPO, will try and repair this afternoon.  Lab owrk overall reassuring.  Mild hypokalemia noted.  CK is WNL.  Patient will be admitted to medicine service for ongoing care.  Final Clinical Impressions(s) / ED Diagnoses   Final diagnoses:  Closed left hip fracture, initial encounter [facility])    New Prescriptions New Prescriptions   No medications on file     Kathryne Hitch 09/30/16 Wallace, MD 10/07/16 0349    Tanna Furry, MD 10/07/16 [phone]

## 2016-09-30 NOTE — Transfer of Care (Signed)
Immediate Anesthesia Transfer of Care Note  Patient: Diane Fry  Procedure(s) Performed: Procedure(s): INTRAMEDULLARY (IM) NAIL FEMORAL (Left)  Patient Location: PACU  Anesthesia Type:General  Level of Consciousness: awake and alert   Airway & Oxygen Therapy: Patient Spontanous Breathing and Patient connected to face mask oxygen  Post-op Assessment: Report given to RN and Post -op Vital signs reviewed and stable  Post vital signs: Reviewed and stable  Last Vitals:  Vitals:   09/30/16 1530 09/30/16 1600  BP: (!) 127/57 123/63  Pulse: 72 73  Resp: 17 11  Temp:  37 C    Last Pain:  Vitals:   09/30/16 1600  TempSrc: Oral  PainSc: 8       Patients Stated Pain Goal: 3 (40/10/27 2536)  Complications: No apparent anesthesia complications

## 2016-09-30 NOTE — Anesthesia Procedure Notes (Signed)
Procedure Name: Intubation Date/Time: 09/30/2016 6:09 PM Performed by: Cynda Familia Pre-anesthesia Checklist: Patient identified, Emergency Drugs available, Suction available and Patient being monitored Patient Re-evaluated:Patient Re-evaluated prior to inductionOxygen Delivery Method: Circle System Utilized Preoxygenation: Pre-oxygenation with 100% oxygen Intubation Type: IV induction Ventilation: Mask ventilation without difficulty Laryngoscope Size: Miller and 2 Grade View: Grade I Tube type: Oral Tube size: 7.0 mm Number of attempts: 1 Airway Equipment and Method: Stylet Placement Confirmation: ETT inserted through vocal cords under direct vision,  positive ETCO2 and breath sounds checked- equal and bilateral Secured at: 22 cm Tube secured with: Tape Dental Injury: Teeth and Oropharynx as per pre-operative assessment  Comments: Smooth IV induction Hatchette--- intubation AM CRNA--- atraumatic--- mouth as preop - no teeth--- bilat BS Hatchette

## 2016-10-01 ENCOUNTER — Encounter (HOSPITAL_COMMUNITY): Payer: Self-pay | Admitting: Orthopedic Surgery

## 2016-10-01 DIAGNOSIS — D649 Anemia, unspecified: Secondary | ICD-10-CM

## 2016-10-01 DIAGNOSIS — I480 Paroxysmal atrial fibrillation: Secondary | ICD-10-CM

## 2016-10-01 DIAGNOSIS — I2510 Atherosclerotic heart disease of native coronary artery without angina pectoris: Secondary | ICD-10-CM

## 2016-10-01 DIAGNOSIS — E119 Type 2 diabetes mellitus without complications: Secondary | ICD-10-CM

## 2016-10-01 DIAGNOSIS — Z8781 Personal history of (healed) traumatic fracture: Secondary | ICD-10-CM

## 2016-10-01 LAB — BASIC METABOLIC PANEL
ANION GAP: 7 (ref 5–15)
BUN: 24 mg/dL — ABNORMAL HIGH (ref 6–20)
CALCIUM: 7.8 mg/dL — AB (ref 8.9–10.3)
CO2: 29 mmol/L (ref 22–32)
Chloride: 104 mmol/L (ref 101–111)
Creatinine, Ser: 1.15 mg/dL — ABNORMAL HIGH (ref 0.44–1.00)
GFR, EST AFRICAN AMERICAN: 48 mL/min — AB (ref >60)
GFR, EST NON AFRICAN AMERICAN: 42 mL/min — AB (ref >60)
GLUCOSE: 111 mg/dL — AB (ref 65–99)
POTASSIUM: 3.7 mmol/L (ref 3.5–5.1)
Sodium: 140 mmol/L (ref 135–145)

## 2016-10-01 LAB — CBC
HCT: 25 % — ABNORMAL LOW (ref 36.0–46.0)
Hemoglobin: 8.4 g/dL — ABNORMAL LOW (ref 12.0–15.0)
MCH: 31 pg (ref 26.0–34.0)
MCHC: 33.6 g/dL (ref 30.0–36.0)
MCV: 92.3 fL (ref 78.0–100.0)
PLATELETS: 124 10*3/uL — AB (ref 150–400)
RBC: 2.71 MIL/uL — AB (ref 3.87–5.11)
RDW: 16.1 % — ABNORMAL HIGH (ref 11.5–15.5)
WBC: 5.8 10*3/uL (ref 4.0–10.5)

## 2016-10-01 LAB — GLUCOSE, CAPILLARY
GLUCOSE-CAPILLARY: 107 mg/dL — AB (ref 65–99)
GLUCOSE-CAPILLARY: 107 mg/dL — AB (ref 65–99)
GLUCOSE-CAPILLARY: 137 mg/dL — AB (ref 65–99)

## 2016-10-01 MED ORDER — MORPHINE SULFATE (PF) 10 MG/ML IV SOLN
0.5000 mg | INTRAVENOUS | Status: DC | PRN
  Administered 2016-10-01: 2 mg via INTRAVENOUS
  Administered 2016-10-03: 0.5 mg via INTRAVENOUS
  Filled 2016-10-01 (×3): qty 1

## 2016-10-01 MED ORDER — ENSURE ENLIVE PO LIQD
237.0000 mL | Freq: Two times a day (BID) | ORAL | Status: DC
  Administered 2016-10-01 – 2016-10-03 (×5): 237 mL via ORAL

## 2016-10-01 MED ORDER — ENOXAPARIN SODIUM 30 MG/0.3ML ~~LOC~~ SOLN: 30.0000 mg | SUBCUTANEOUS | Status: DC

## 2016-10-01 MED ORDER — HYDROCODONE-ACETAMINOPHEN 5-325 MG PO TABS
1.0000 | ORAL_TABLET | ORAL | Status: DC | PRN
Start: 2016-10-01 — End: 2016-10-04
  Administered 2016-10-01 – 2016-10-03 (×11): 2 via ORAL
  Administered 2016-10-04: 1 via ORAL
  Administered 2016-10-04: 2 via ORAL
  Filled 2016-10-01 (×7): qty 2
  Filled 2016-10-01: qty 1
  Filled 2016-10-01 (×5): qty 2

## 2016-10-01 MED ORDER — HYDROCODONE-ACETAMINOPHEN 5-325 MG PO TABS: 1.0000 | ORAL_TABLET | Freq: Four times a day (QID) | ORAL | 0 refills | Status: DC | PRN

## 2016-10-01 NOTE — Progress Notes (Signed)
Patient requesting hydrocodone for pain, order is for every 6 hours PRN, spoke with Dr.Elgergaway, hydrocodone order changed to every 4 hours PRN for pain.

## 2016-10-01 NOTE — NC FL2 (Signed)
[place] LEVEL OF CARE SCREENING TOOL     IDENTIFICATION  Patient Name: Diane Fry Birthdate: [birth year] Sex: female Admission Date (Current Location): 09/30/2016  [facility] and [place] Number:  Herbalist and Address:  [facility],  [place] [address] [place]      Provider Number: [id number]  Attending Physician Name and Address:  Albertine Patricia, MD  Relative Name and Phone Number:       Current Level of Care: Hospital Recommended Level of Care: [place] Prior Approval Number:    Date Approved/Denied:   PASRR Number:   [id number]  Discharge Plan: SNF    Current Diagnoses: Patient Active Problem List   Diagnosis Date Noted  . Closed left hip fracture, initial encounter ([place]) 09/30/2016  . Acute on chronic left systolic heart failure ([place]) 11/24/2014  . New-onset angina ([place]) 10/31/2014  . Paroxysmal atrial fibrillation ([place]) 05/15/2014  . Community acquired bacterial pneumonia 05/10/2014  . Coronary atherosclerosis of native coronary artery 05/10/2014  . Type 2 diabetes mellitus ([place]) 05/10/2014  . Hypotension 05/10/2014  . Acute kidney injury ([place]) 05/10/2014  . Pituitary macroadenoma with extrasellar extension ([place]) 09/07/2013  . Pituitary macroadenoma ([place]) 09/07/2013    Orientation RESPIRATION BLADDER Height & Weight     Self, Time, Situation, Place  Normal Continent Weight: 169 lb (76.7 kg) Height:  5\' 3"  (160 cm)  BEHAVIORAL SYMPTOMS/MOOD NEUROLOGICAL BOWEL NUTRITION STATUS      Continent Diet (Carb Modified)  AMBULATORY STATUS COMMUNICATION OF NEEDS Skin   Limited Assist Verbally Normal                       Personal Care Assistance Level of Assistance  Bathing, Feeding, Dressing Bathing Assistance: Limited assistance Feeding assistance: Independent Dressing Assistance: Limited assistance     Functional Limitations Info  Sight, Hearing, Speech Sight Info: Impaired Hearing  Info: Adequate Speech Info: Adequate    SPECIAL CARE FACTORS FREQUENCY  PT (By licensed PT), OT (By licensed OT)     PT Frequency: 5 OT Frequency: 5            Contractures Contractures Info: Not present    Additional Factors Info  Code Status, Allergies Code Status Info: Fullcode Allergies Info: No Known Allergies           Current Medications (10/01/2016):  This is the current hospital active medication list Current Facility-Administered Medications  Medication Dose Route Frequency Provider Last Rate Last Dose  . acetaminophen (TYLENOL) tablet 650 mg  650 mg Oral Q6H PRN Nicholes Stairs, MD       Or  . acetaminophen (TYLENOL) suppository 650 mg  650 mg Rectal Q6H PRN Nicholes Stairs, MD      . ALPRAZolam Duanne Moron) tablet 0.25 mg  0.25 mg Oral QID PRN Belkys A Regalado, MD      . Derrill Memo ON 10/04/2016] amiodarone (PACERONE) tablet 100 mg  100 mg Oral Once per day on Mon Thu Belkys A Regalado, MD      . aspirin EC tablet 81 mg  81 mg Oral Daily Belkys A Regalado, MD   81 mg at 10/01/16 0952  . atorvastatin (LIPITOR) tablet 40 mg  40 mg Oral Daily Belkys A Regalado, MD   40 mg at 10/01/16 0951  . clopidogrel (PLAVIX) tablet 75 mg  75 mg Oral Daily Belkys A Regalado, MD   75 mg at 10/01/16 0950  . docusate sodium (COLACE)  capsule 100 mg  100 mg Oral BID Belkys A Regalado, MD   100 mg at 10/01/16 0950  . enoxaparin (LOVENOX) injection 30 mg  30 mg Subcutaneous Q24H Dawood S Elgergawy, MD      . furosemide (LASIX) tablet 40 mg  40 mg Oral BID Belkys A Regalado, MD   40 mg at 10/01/16 0952  . HYDROcodone-acetaminophen (NORCO/VICODIN) 5-325 MG per tablet 1-2 tablet  1-2 tablet Oral Q4H PRN Albertine Patricia, MD   2 tablet at 10/01/16 1044  . insulin aspart (novoLOG) injection 0-9 Units  0-9 Units Subcutaneous TID WC Belkys A Regalado, MD   1 Units at 10/01/16 1315  . isosorbide mononitrate (IMDUR) 24 hr tablet 30 mg  30 mg Oral q morning - 10a Belkys A Regalado, MD   30 mg  at 10/01/16 0948  . lactated ringers infusion   Intravenous Continuous [name], MD 75 mL/hr at 10/01/16 1047    . methocarbamol (ROBAXIN) tablet 500 mg  500 mg Oral Q6H PRN Nicholes Stairs, MD   500 mg at 10/01/16 0540   Or  . methocarbamol (ROBAXIN) 500 mg in dextrose 5 % 50 mL IVPB  500 mg Intravenous Q6H PRN Nicholes Stairs, MD   500 mg at 09/30/16 2224  . metoCLOPramide (REGLAN) tablet 5-10 mg  5-10 mg Oral Q8H PRN Nicholes Stairs, MD       Or  . metoCLOPramide [facility]) injection 5-10 mg  5-10 mg Intravenous Q8H PRN Nicholes Stairs, MD      . metoprolol tartrate (LOPRESSOR) tablet 12.5 mg  12.5 mg Oral BID Belkys A Regalado, MD   12.5 mg at 10/01/16 0951  . morphine 2 MG/ML injection 0.5 mg  0.5 mg Intravenous Q2H PRN Belkys A Regalado, MD      . morphine 2 MG/ML injection 2 mg  2 mg Intravenous Q2H PRN Nicholes Stairs, MD   2 mg at 10/01/16 6945  . nitroGLYCERIN (NITROSTAT) SL tablet 0.4 mg  0.4 mg Sublingual Q5 min PRN Belkys A Regalado, MD      . omega-3 acid ethyl esters (LOVAZA) capsule 1 g  1 g Oral Daily Belkys A Regalado, MD   1 g at 09/30/16 2258  . ondansetron (ZOFRAN) tablet 4 mg  4 mg Oral Q6H PRN Nicholes Stairs, MD       Or  . ondansetron [facility]) injection 4 mg  4 mg Intravenous Q6H PRN Nicholes Stairs, MD      . polyvinyl alcohol (LIQUIFILM TEARS) 1.4 % ophthalmic solution 1 drop  1 drop Both Eyes PRN Belkys A Regalado, MD         Discharge Medications: Please see discharge summary for a list of discharge medications.  Relevant Imaging Results:  Relevant Lab Results:   Additional Information ssn: [phone]  Lia Hopping, LCSW

## 2016-10-01 NOTE — Evaluation (Signed)
Occupational Therapy Evaluation Patient Details Name: Diane Fry MRN: [id number] DOB: [birth year] Today's Date: 10/01/2016    History of Present Illness  pt sustained a L hip fx and is s/p IM nail.  PMH:  CAD, stent, anxiety, PAF, DM   Clinical Impression   This 83 year old female was admitted for the above. She was independent prior to admission, and worked part time at [employer]. She needs +2 assistance for sit to stand (min) during ADLs and for transfers. Pt needs up to max +2 assistance for LB adls. Goals in acute are for min A     Follow Up Recommendations  SNF    Equipment Recommendations  3 in 1 bedside commode    Recommendations for Other Services       Precautions / Restrictions Precautions Precautions: (P) Fall Restrictions Weight Bearing Restrictions: (P) No Other Position/Activity Restrictions: (P) WBAT      Mobility Bed Mobility Overal bed mobility: Needs Assistance Bed Mobility: Supine to Sit     Supine to sit: Max assist;+2 for physical assistance     General bed mobility comments: assist for legs and trunk  Transfers Overall transfer level: Needs assistance Equipment used: Rolling walker (2 wheeled) Transfers: Stand Pivot Transfers;Sit to/from Stand Sit to Stand: Min assist;+2 physical assistance;+2 safety/equipment;From elevated surface Stand pivot transfers: Min assist;+2 physical assistance;+2 safety/equipment       General transfer comment: assist to rise and steady.  Assist to move walker to complete turn. Cues for UE/LE placement    Balance Overall balance assessment: (P) Needs assistance Sitting-balance support: (P) No upper extremity supported;Feet supported Sitting balance-Leahy Scale: (P) Good     Standing balance support: (P) Bilateral upper extremity supported Standing balance-Leahy Scale: (P) Poor                             ADL either performed or assessed with clinical judgement   ADL Overall ADL's : Needs  assistance/impaired Eating/Feeding: Sitting;Independent   Grooming: Set up;Sitting   Upper Body Bathing: Set up;Sitting   Lower Body Bathing: +2 for safety/equipment;+2 for physical assistance;Sit to/from stand;Maximal assistance   Upper Body Dressing : Set up;Sitting   Lower Body Dressing: Total assistance;+2 for physical assistance;+2 for safety/equipment;Sit to/from stand   Toilet Transfer: Minimal assistance;+2 for safety/equipment;+2 for physical assistance;RW Toilet Transfer Details (indicate cue type and reason): to chair Toileting- Clothing Manipulation and Hygiene: Maximal assistance;Sit to/from stand;+2 for safety/equipment;+2 for physical assistance         General ADL Comments: pt would benefit from AE; did not review on this visit.  She has pain in bil UEs, especially L, as she fell on this side. Able to use arms for adls     Vision         Perception     Praxis      Pertinent Vitals/Pain Pain Assessment: (P) 0-10 Pain Score: (P) 8  Pain Location: (P) L hip Pain Descriptors / Indicators: (P) Aching Pain Intervention(s): (P) Limited activity within patient's tolerance;Premedicated before session;Monitored during session;Patient requesting pain meds-RN notified;Ice applied     Hand Dominance (P) Right   Extremity/Trunk Assessment Upper Extremity Assessment Upper Extremity Assessment: (P) Generalized weakness      Cervical / Trunk Assessment Cervical / Trunk Assessment: (P) Kyphotic   Communication Communication Communication: HOH   Cognition Arousal/Alertness: (P) Awake/alert Behavior During Therapy: (P) WFL for tasks assessed/performed Overall Cognitive Status: (P) Within Functional Limits for tasks  assessed                                     General Comments       Exercises     Shoulder Instructions      Home Living Family/patient expects to be discharged to:: (P) Unsure Living Arrangements: (P) Alone                                Additional Comments: (P) on one level, 3 STE with one rail, no walking DME, elevated toilet seat, tub shower      Prior Functioning/Environment Level of Independence: (P) Independent        Comments: (P) works PT at [employer] List: Decreased strength;Decreased range of motion;Decreased activity tolerance;Decreased knowledge of use of DME or AE;Pain      OT Treatment/Interventions: Self-care/ADL training;DME and/or AE instruction;Patient/family education;Therapeutic activities    OT Goals(Current goals can be found in the care plan section) Acute Rehab OT Goals Patient Stated Goal: decreased pain, return to independence OT Goal Formulation: With patient Time For Goal Achievement: 10/08/16 Potential to Achieve Goals: Good ADL Goals Pt Will Perform Lower Body Bathing: with min assist;with adaptive equipment;sit to/from stand Pt Will Perform Lower Body Dressing: with min assist;with adaptive equipment;sit to/from stand Pt Will Transfer to Toilet: with min assist;stand pivot transfer;bedside commode Pt Will Perform Toileting - Clothing Manipulation and hygiene: with min assist;sit to/from stand  OT Frequency: Min 2X/week   Barriers to D/C:            Co-evaluation PT/OT/SLP Co-Evaluation/Treatment: Yes Reason for Co-Treatment: (P) For patient/therapist safety PT goals addressed during session: (P) Mobility/safety with mobility OT goals addressed during session: (P) ADL's and self-care      End of Session    Activity Tolerance: Patient limited by pain Patient left: in chair;with call bell/phone within reach;with chair alarm set  OT Visit Diagnosis: Muscle weakness (generalized) (M62.81);Pain Pain - Right/Left: Left Pain - part of body: Hip                Time: 4097-3532 OT Time Calculation (min): 26 min Charges:  OT General Charges $OT Visit: 1 Procedure OT Evaluation $OT Eval Low Complexity: 1 Procedure G-Codes:      Diane Fry, OTR/L [phone] 10/01/2016  Diane Fry 10/01/2016, 11:33 AM

## 2016-10-01 NOTE — Progress Notes (Addendum)
PROGRESS NOTE                                                                                                                                                                                                             Patient Demographics:    Diane Fry, is a 83 y.o. female, DOB - [birth year], ZOX:[id number]  Admit date - 09/30/2016   Admitting Physician Nicholes Stairs, MD  Outpatient Primary MD for the patient is ROBERTS, Sharol Given, MD  LOS - 1  Chief Complaint  Patient presents with  . Fall  . Hip Pain       Brief Narrative   83 y.o. female with medical history significant of CAD, S/P Stent 2016, anxiety, DM, PAF,  who presents With mechanical fall and left hip fracture, status post surgical repair 4/12 by Dr.Rogers   Subjective:    Diane Fry today has, No headache, No chest pain, No abdominal pain - No Nausea, Reports her hip pain is controlled  Assessment  & Plan :    Active Problems:   Coronary atherosclerosis of native coronary artery   Type 2 diabetes mellitus (HCC)   Paroxysmal atrial fibrillation (HCC)   Closed left hip fracture, initial encounter (HCC)   Left Hip fracture - Secondary to mechanical fall, status post surgical repair by Dr. Leona Carry, seen by PT, recommendation is for SNF, continue with when necessary pain medication.  Postoperative anemia - Hemoglobin 8.4 today, will continue to monitor closely and transfuse as needed, will hold aspirin and Plavix today, as we will hold Lovenox ending repeat hemoglobin in a.m.  CAD - She denies any chest pain or shortness of breath, continue statin, she is on aspirin and Plavix at home , her stent was placed in May 2016, discussed with Dr. Terrence Dupont, aspirin and Plavix can be stopped in favor of initiating requests the setting of known history of A. fib and need for DVT prophylaxis .  HTN - Continue to hold Cozaar given soft blood pressure , continue with metoprolol and  Imdur  DM - hold metformin, will order SSI.   Chronic Diastolic heart Failure.  - On lasix 80 mg  in am and 40 mg  at night. Will resume 40 mg BID starting 4-13, monitor renal function.   PAF -on amiodarone 100 mg Twice a week, which will be  stopped given prolonged QTC as discussed with Dr. Terrence Dupont. - D/W with primary cardiologist Dr. Terrence Dupont, planm to start on Eliquis if Hgb is stable in am.  Hypothyroidism - Continue with synthroid.   Prolonged QTc - QTc is 592 on admission, check potassium, check magnesium and replete as needed, will stop amiodarone.    Code Status : Ful  Family Communication  : none at bedside  Disposition Plan  : will need SNF placement  Consults  :  Ortho  Procedures  : left hip surgical repair  DVT Prophylaxis  will start Eliquis in  a.m. of hemoglobin is stable  Lab Results  Component Value Date   PLT 124 (L) 10/01/2016    Antibiotics  :    Anti-infectives    Start     Dose/Rate Route Frequency Ordered Stop   10/01/16 0600  ceFAZolin (ANCEF) IVPB 2g/100 mL premix     2 g 200 mL/hr over 30 Minutes Intravenous On call to O.R. 09/30/16 1652 09/30/16 1811   10/01/16 0000  ceFAZolin (ANCEF) IVPB 2g/100 mL premix     2 g 200 mL/hr over 30 Minutes Intravenous Every 6 hours 09/30/16 2103 10/01/16 1119   09/30/16 1736  ceFAZolin (ANCEF) 2-4 GM/100ML-% IVPB    Comments:  Bridget Hartshorn   : cabinet override      09/30/16 1736 10/01/16 0544        Objective:   Vitals:   10/01/16 0201 10/01/16 0538 10/01/16 0933 10/01/16 1334  BP: 124/65 (!) 105/46 (!) 118/56 (!) 110/54  Pulse: 77 66 82 81  Resp: 15 16 12 16   Temp: 97.8 F (36.6 C) 98.3 F (36.8 C) 97.7 F (36.5 C) 98.2 F (36.8 C)  TempSrc: Oral Oral Oral Oral  SpO2: 99% 100% 100% 95%  Weight:      Height:        Wt Readings from Last 3 Encounters:  09/30/16 76.7 kg (169 lb)  11/28/14 75.3 kg (166 lb)  11/01/14 78.4 kg (172 lb 13.5 oz)     Intake/Output Summary (Last 24  hours) at 10/01/16 1505 Last data filed at 10/01/16 1335  Gross per 24 hour  Intake             4180 ml  Output              200 ml  Net             3980 ml     Physical Exam  Awake Alert, Oriented X 3,  Supple Neck,No JVD, N Symmetrical Chest wall movement, Good air movement bilaterally, CTAB RRR,No Gallops,Rubs or new Murmurs, No Parasternal Heave +ve B.Sounds, Abd Soft, No tenderness,No rebound - guarding or rigidity. No Cyanosis, Clubbing or edema, No new Rash or bruise      Data Review:    CBC  Recent Labs Lab 09/30/16 1233 10/01/16 0755  WBC 9.5 5.8  HGB 11.6* 8.4*  HCT 34.7* 25.0*  PLT 166 124*  MCV 91.1 92.3  MCH 30.4 31.0  MCHC 33.4 33.6  RDW 15.5 16.1*  LYMPHSABS 0.9  --   MONOABS 0.5  --   EOSABS 0.0  --   BASOSABS 0.0  --     Chemistries   Recent Labs Lab 09/30/16 1233 09/30/16 2133 10/01/16 0755  NA 142  --  140  K 3.1*  --  3.7  CL 102  --  104  CO2 29  --  29  GLUCOSE 159*  --  111*  BUN 36*  --  24*  CREATININE 1.37* 1.24* 1.15*  CALCIUM 9.0  --  7.8*   ------------------------------------------------------------------------------------------------------------------ No results for input(s): CHOL, HDL, LDLCALC, TRIG, CHOLHDL, LDLDIRECT in the last 72 hours.  Lab Results  Component Value Date   HGBA1C 6.5 (H) 09/08/2013   ------------------------------------------------------------------------------------------------------------------ No results for input(s): TSH, T4TOTAL, T3FREE, THYROIDAB in the last 72 hours.  Invalid input(s): FREET3 ------------------------------------------------------------------------------------------------------------------ No results for input(s): VITAMINB12, FOLATE, FERRITIN, TIBC, IRON, RETICCTPCT in the last 72 hours.  Coagulation profile  Recent Labs Lab 09/30/16 1233  INR 1.11    No results for input(s): DDIMER in the last 72 hours.  Cardiac Enzymes No results for input(s): CKMB,  TROPONINI, MYOGLOBIN in the last 168 hours.  Invalid input(s): CK ------------------------------------------------------------------------------------------------------------------    Component Value Date/Time   BNP 154.3 (H) 11/27/2014 0405    Inpatient Medications  Scheduled Meds: . atorvastatin  40 mg Oral Daily  . docusate sodium  100 mg Oral BID  . furosemide  40 mg Oral BID  . insulin aspart  0-9 Units Subcutaneous TID WC  . isosorbide mononitrate  30 mg Oral q morning - 10a  . metoprolol tartrate  12.5 mg Oral BID  . omega-3 acid ethyl esters  1 g Oral Daily   Continuous Infusions: . lactated ringers 75 mL/hr at 10/01/16 1047   PRN Meds:.acetaminophen **OR** acetaminophen, ALPRAZolam, HYDROcodone-acetaminophen, methocarbamol **OR** methocarbamol (ROBAXIN)  IV, metoCLOPramide **OR** metoCLOPramide (REGLAN) injection, morphine injection, morphine injection, nitroGLYCERIN, ondansetron **OR** ondansetron (ZOFRAN) IV, polyvinyl alcohol  Micro Results No results found for this or any previous visit (from the past 240 hour(s)).  Radiology Reports Dg Chest 1 View  Result Date: 09/30/2016 CLINICAL DATA:  Unwitnessed fall. EXAM: CHEST 1 VIEW COMPARISON:  Radiographs of August 02, 2016. FINDINGS: Stable cardiomediastinal silhouette. Atherosclerosis of thoracic aorta is noted. No pneumothorax or pleural effusion is noted. Mild left lateral basilar opacity is noted concerning for scarring or atelectasis. Focal atelectasis or scarring is noted in right suprahilar region. Bony thorax is unremarkable. IMPRESSION: Aortic atherosclerosis. Probable scarring or atelectasis seen in left basilar and right suprahilar regions. Electronically Signed   By: Marijo Conception, M.D.   On: 09/30/2016 12:25   Dg Knee 1-2 Views Left  Result Date: 09/30/2016 CLINICAL DATA:  Fall with left hip fracture. EXAM: LEFT KNEE - 1-2 VIEW COMPARISON:  None. FINDINGS: Negative for fracture or joint effusion. Normal  alignment as permitted by angle of imaging. No notable degenerative changes. Small, incidental bony excrescence from the medial tibial metaphysis. IMPRESSION: Negative. Electronically Signed   By: Monte Fantasia M.D.   On: 09/30/2016 12:21   Dg Tibia/fibula Left  Result Date: 09/30/2016 CLINICAL DATA:  Fall with left leg foreshortening. Shin pain. Initial encounter. EXAM: LEFT TIBIA AND FIBULA - 2 VIEW COMPARISON:  None. FINDINGS: No acute fracture or dislocation.  Osteopenia and atherosclerosis. IMPRESSION: No acute finding. Electronically Signed   By: Monte Fantasia M.D.   On: 09/30/2016 12:22   Dg Humerus Left  Result Date: 09/30/2016 CLINICAL DATA:  Left arm pain after fall. EXAM: LEFT HUMERUS - 2+ VIEW COMPARISON:  None. FINDINGS: There is no evidence of fracture or other focal bone lesions. Soft tissues are unremarkable. IMPRESSION: Normal left humerus. Electronically Signed   By: Marijo Conception, M.D.   On: 09/30/2016 12:27   Dg C-arm 61-120 Min-no Report  Result Date: 09/30/2016 Fluoroscopy was utilized by the requesting physician.  No radiographic interpretation.   Dg Hip Operative Unilat  W Or W/o Pelvis Left  Result Date: 09/30/2016 CLINICAL DATA:  Internal fixation of left femoral fracture. Initial encounter. EXAM: OPERATIVE LEFT HIP WITH PELVIS COMPARISON:  Left hip radiographs performed earlier today at 12:07 p.m. FINDINGS: Four fluoroscopic C-arm images are provided from the OR. These demonstrate placement of an intramedullary nail and screw through the left femur, transfixing the left femoral intertrochanteric fracture in grossly anatomic alignment. The left femoral head remains seated at the acetabulum. No new fractures are seen. IMPRESSION: Status post internal fixation of left femoral intertrochanteric fracture in grossly anatomic alignment. Electronically Signed   By: Garald Balding M.D.   On: 09/30/2016 23:56   Dg Hip Unilat With Pelvis 2-3 Views Left  Result Date:  09/30/2016 CLINICAL DATA:  Fall with left hip pain.  Initial encounter. EXAM: DG HIP (WITH OR WITHOUT PELVIS) 2-3V LEFT COMPARISON:  None. FINDINGS: Acute intertrochanteric left femur fracture with varus angulation. Both hips are located. No evidence of pelvic ring fracture. Osteopenia and atherosclerosis. IMPRESSION: Acute intertrochanteric left femur fracture with varus angulation. Electronically Signed   By: Monte Fantasia M.D.   On: 09/30/2016 12:20     Arpita Fentress M.D on 10/01/2016 at 3:05 PM  Between 7am to 7pm - Pager - [phone]  After 7pm go to [email or web address] - password [facility]  [facility] -  Office  [phone]

## 2016-10-01 NOTE — Progress Notes (Signed)
Physical Therapy Treatment Patient Details Name: Diane Fry MRN: [id number] DOB: 1928-08-14 Today's Date: 10/01/2016    History of Present Illness pt sustained a L hip fx and is s/p IM nail.  PMH:  CAD, stent, anxiety, PAF, DM    PT Comments    Moderate improvement in activity tolerance with somewhat improved pain control.   Follow Up Recommendations  SNF     Equipment Recommendations  Rolling walker with 5" wheels    Recommendations for Other Services OT consult     Precautions / Restrictions Precautions Precautions: Fall Restrictions Weight Bearing Restrictions: No Other Position/Activity Restrictions: WBAT    Mobility  Bed Mobility Overal bed mobility: Needs Assistance Bed Mobility: Sit to Supine     Supine to sit: Max assist;+2 for physical assistance Sit to supine: Mod assist;+2 for physical assistance;+2 for safety/equipment   General bed mobility comments: assist for legs and trunk  Transfers Overall transfer level: Needs assistance Equipment used: Rolling walker (2 wheeled) Transfers: Stand Pivot Transfers;Sit to/from Stand Sit to Stand: Min assist;+2 physical assistance;+2 safety/equipment;From elevated surface Stand pivot transfers: Min assist;+2 physical assistance;+2 safety/equipment       General transfer comment: assist to rise and steady.  Assist to move walker to complete turn. Cues for UE/LE placement  Ambulation/Gait Ambulation/Gait assistance: Min assist;Mod assist;+2 physical assistance;+2 safety/equipment Ambulation Distance (Feet): 13 Feet Assistive device: Rolling walker (2 wheeled) Gait Pattern/deviations: Step-to pattern;Decreased step length - right;Decreased step length - left;Shuffle;Trunk flexed Gait velocity: decr Gait velocity interpretation: Below normal speed for age/gender General Gait Details: cues for sequence, posture and position from RW.     Stairs            Wheelchair Mobility    Modified Rankin (Stroke  Patients Only)       Balance Overall balance assessment: Needs assistance Sitting-balance support: No upper extremity supported;Feet supported Sitting balance-Leahy Scale: Good     Standing balance support: Bilateral upper extremity supported Standing balance-Leahy Scale: Poor                              Cognition Arousal/Alertness: Awake/alert Behavior During Therapy: WFL for tasks assessed/performed Overall Cognitive Status: Within Functional Limits for tasks assessed                                        Exercises General Exercises - Lower Extremity Ankle Circles/Pumps: Both;15 reps;Supine    General Comments        Pertinent Vitals/Pain Pain Assessment: 0-10 Pain Score: 6  Pain Location: L hip Pain Descriptors / Indicators: Aching Pain Intervention(s): Limited activity within patient's tolerance;Monitored during session;Premedicated before session;Ice applied    Home Living Family/patient expects to be discharged to:: Unsure Living Arrangements: Alone             Additional Comments: on one level, 3 STE with one rail, no walking DME, elevated toilet seat, tub shower    Prior Function Level of Independence: Independent      Comments: works PT at [employer] (current goals can now be found in the care plan section) Acute Rehab PT Goals Patient Stated Goal: decreased pain, return to independence PT Goal Formulation: With patient Time For Goal Achievement: 10/08/16 Potential to Achieve Goals: Fair Progress towards PT goals: Progressing toward goals    Frequency  Min 4X/week      PT Plan Current plan remains appropriate    Co-evaluation PT/OT/SLP Co-Evaluation/Treatment: Yes Reason for Co-Treatment: For patient/therapist safety PT goals addressed during session: Mobility/safety with mobility OT goals addressed during session: ADL's and self-care     End of Session Equipment Utilized During Treatment:  Gait belt Activity Tolerance: Patient tolerated treatment well;Patient limited by pain;Patient limited by fatigue Patient left: with call bell/phone within reach;in chair;with chair alarm set Nurse Communication: Mobility status PT Visit Diagnosis: Unsteadiness on feet (R26.81);History of falling (Z91.81);Muscle weakness (generalized) (M62.81);Difficulty in walking, not elsewhere classified (R26.2);Pain Pain - Right/Left: Left Pain - part of body: Hip     Time: 1660-6301 PT Time Calculation (min) (ACUTE ONLY): 27 min  Charges:  $Gait Training: 8-22 mins $Therapeutic Activity: 8-22 mins                    G Codes:       Pg [phone]    Kaylob Wallen 10/01/2016, 3:07 PM

## 2016-10-01 NOTE — Evaluation (Signed)
Physical Therapy Evaluation Patient Details Name: Diane Fry MRN: [id number] DOB: 1928/11/22 Today's Date: 10/01/2016   History of Present Illness  pt sustained a L hip fx and is s/p IM nail.  PMH:  CAD, stent, anxiety, PAF, DM  Clinical Impression  Pt s/p L hip fx and presents with decreased L LE strength/ROM and post op pain limiting functional mobility.  Pt would benefit from follow up rehab at SNF level to maximize IND and safety prior to return home with ltd assist.    Follow Up Recommendations SNF    Equipment Recommendations  Rolling walker with 5" wheels    Recommendations for Other Services OT consult     Precautions / Restrictions Precautions Precautions: Fall Restrictions Weight Bearing Restrictions: No Other Position/Activity Restrictions: WBAT      Mobility  Bed Mobility Overal bed mobility: Needs Assistance Bed Mobility: Supine to Sit     Supine to sit: Max assist;+2 for physical assistance     General bed mobility comments: assist for legs and trunk  Transfers Overall transfer level: Needs assistance Equipment used: Rolling walker (2 wheeled) Transfers: Stand Pivot Transfers;Sit to/from Stand Sit to Stand: Min assist;+2 physical assistance;+2 safety/equipment;From elevated surface Stand pivot transfers: Min assist;+2 physical assistance;+2 safety/equipment       General transfer comment: assist to rise and steady.  Assist to move walker to complete turn. Cues for UE/LE placement  Ambulation/Gait Ambulation/Gait assistance: Min assist;Mod assist;+2 physical assistance;+2 safety/equipment Ambulation Distance (Feet): 2 Feet Assistive device: Rolling walker (2 wheeled) Gait Pattern/deviations: Step-to pattern;Decreased step length - right;Decreased step length - left;Shuffle;Trunk flexed Gait velocity: decr Gait velocity interpretation: Below normal speed for age/gender General Gait Details: cues for sequence, posture and position from RW.     Stairs            Wheelchair Mobility    Modified Rankin (Stroke Patients Only)       Balance Overall balance assessment: Needs assistance Sitting-balance support: No upper extremity supported;Feet supported Sitting balance-Leahy Scale: Good     Standing balance support: Bilateral upper extremity supported Standing balance-Leahy Scale: Poor                               Pertinent Vitals/Pain Pain Assessment: 0-10 Pain Score: 8  Pain Location: L hip Pain Descriptors / Indicators: Aching Pain Intervention(s): Limited activity within patient's tolerance;Premedicated before session;Monitored during session;Patient requesting pain meds-RN notified;Ice applied    Home Living Family/patient expects to be discharged to:: Unsure Living Arrangements: Alone               Additional Comments: on one level, 3 STE with one rail, no walking DME, elevated toilet seat, tub shower    Prior Function Level of Independence: Independent         Comments: works PT at [employer]: Right    Extremity/Trunk Assessment   Upper Extremity Assessment Upper Extremity Assessment: Generalized weakness    Lower Extremity Assessment Lower Extremity Assessment: Generalized weakness;LLE deficits/detail LLE: Unable to fully assess due to pain    Cervical / Trunk Assessment Cervical / Trunk Assessment: Kyphotic  Communication   Communication: HOH  Cognition Arousal/Alertness: Awake/alert Behavior During Therapy: WFL for tasks assessed/performed Overall Cognitive Status: Within Functional Limits for tasks assessed  General Comments      Exercises     Assessment/Plan    PT Assessment Patient needs continued PT services  PT Problem List Decreased strength;Decreased range of motion;Decreased activity tolerance;Decreased balance;Decreased mobility;Decreased knowledge of use  of DME;Pain;Obesity;Decreased safety awareness       PT Treatment Interventions DME instruction;Gait training;Stair training;Functional mobility training;Therapeutic activities;Therapeutic exercise;Balance training;Patient/family education    PT Goals (Current goals can be found in the Care Plan section)  Acute Rehab PT Goals Patient Stated Goal: decreased pain, return to independence PT Goal Formulation: With patient Time For Goal Achievement: 10/08/16 Potential to Achieve Goals: Fair    Frequency Min 4X/week   Barriers to discharge        Co-evaluation PT/OT/SLP Co-Evaluation/Treatment: Yes Reason for Co-Treatment: For patient/therapist safety PT goals addressed during session: Mobility/safety with mobility OT goals addressed during session: ADL's and self-care       End of Session Equipment Utilized During Treatment: Gait belt Activity Tolerance: Patient tolerated treatment well;Patient limited by pain;Patient limited by fatigue Patient left: with call bell/phone within reach;in chair;with chair alarm set Nurse Communication: Mobility status PT Visit Diagnosis: Unsteadiness on feet (R26.81);History of falling (Z91.81);Muscle weakness (generalized) (M62.81);Difficulty in walking, not elsewhere classified (R26.2);Pain Pain - Right/Left: Left Pain - part of body: Hip    Time: 4270-6237 PT Time Calculation (min) (ACUTE ONLY): 27 min   Charges:   PT Evaluation $PT Eval Low Complexity: 1 Procedure     PT G Codes:        Pg [phone]   [name] 10/01/2016, 11:35 AM

## 2016-10-01 NOTE — Progress Notes (Signed)
   Subjective:  Patient reports pain as mild.  No complaints of SOB, CP.  Objective:   VITALS:   Vitals:   10/01/16 0201 10/01/16 0538 10/01/16 0933 10/01/16 1334  BP: 124/65 (!) 105/46 (!) 118/56 (!) 110/54  Pulse: 77 66 82 81  Resp: 15 16 12 16   Temp: 97.8 F (36.6 C) 98.3 F (36.8 C) 97.7 F (36.5 C) 98.2 F (36.8 C)  TempSrc: Oral Oral Oral Oral  SpO2: 99% 100% 100% 95%  Weight:      Height:        Neurologically intact +NVI Dressing c/d/I 2+DP  Lab Results  Component Value Date   WBC 5.8 10/01/2016   HGB 8.4 (L) 10/01/2016   HCT 25.0 (L) 10/01/2016   MCV 92.3 10/01/2016   PLT 124 (L) 10/01/2016   BMET    Component Value Date/Time   NA 140 10/01/2016 0755   K 3.7 10/01/2016 0755   CL 104 10/01/2016 0755   CO2 29 10/01/2016 0755   GLUCOSE 111 (H) 10/01/2016 0755   BUN 24 (H) 10/01/2016 0755   CREATININE 1.15 (H) 10/01/2016 0755   CALCIUM 7.8 (L) 10/01/2016 0755   GFRNONAA 42 (L) 10/01/2016 0755   GFRAA 48 (L) 10/01/2016 0755     Assessment/Plan: 1 Day Post-Op   Active Problems:   Coronary atherosclerosis of native coronary artery   Type 2 diabetes mellitus (HCC)   Paroxysmal atrial fibrillation (HCC)   Closed left hip fracture, initial encounter ([place])   Advance diet Up with therapy WBAT LLE Maintain dressing SCDs and home plavix, asa for DVT ppx   Nicholes Stairs 10/01/2016, 3:36 PM   Geralynn Rile, MD [phone]

## 2016-10-01 NOTE — Anesthesia Postprocedure Evaluation (Addendum)
Anesthesia Post Note  Patient: KEYERRA LAMERE  Procedure(s) Performed: Procedure(s) (LRB): INTRAMEDULLARY (IM) NAIL FEMORAL (Left)  Patient location during evaluation: PACU Anesthesia Type: General Level of consciousness: awake, sedated and patient cooperative Pain management: pain level controlled Vital Signs Assessment: post-procedure vital signs reviewed and stable Respiratory status: spontaneous breathing Cardiovascular status: stable Postop Assessment: no signs of nausea or vomiting Anesthetic complications: no        Last Vitals:  Vitals:   10/01/16 0201 10/01/16 0538  BP: 124/65 (!) 105/46  Pulse: 77 66  Resp: 15 16  Temp: 36.6 C 36.8 C    Last Pain:  Vitals:   10/01/16 0731  TempSrc:   PainSc: 4    Pain Goal: Patients Stated Pain Goal: 4 (10/01/16 0731)               Avarie Tavano JR,JOHN Mateo Flow

## 2016-10-01 NOTE — Progress Notes (Signed)
Report called and given to [facility] for patients transfer. Patient transferred to 1532 by myself and and Vira Agar, Nurse Tech along with patients belongings.

## 2016-10-01 NOTE — Progress Notes (Signed)
Nutrition Brief Note  Consult per Hip Fracture protocol.  Wt Readings from Last 15 Encounters:  09/30/16 169 lb (76.7 kg)  11/28/14 166 lb (75.3 kg)  11/01/14 172 lb 13.5 oz (78.4 kg)  05/14/14 165 lb 8 oz (75.1 kg)  09/07/13 168 lb 6.9 oz (76.4 kg)    Body mass index is 29.94 kg/m. Patient meets criteria for overweight/borderline obesity based on current BMI. L hip surgical wound from 09/30/16 for surgical fixation of closed L hip fracture.   Current diet order is Carb Modified and pt consumed 100% of both breakfast and lunch today without issue. Labs and medications reviewed.   Per CSW notes, plan for SNF at d/c. No nutrition interventions warranted at this time. If nutrition issues arise, please re-consult RD.     Jarome Matin, MS, RD, LDN, [facility] Inpatient Clinical Dietitian Pager # [phone] After hours/weekend pager # [phone]

## 2016-10-01 NOTE — Clinical Social Work Note (Signed)
Clinical Social Work Assessment  Patient Details  Name: Diane Fry MRN: [id number] Date of Birth: March 23, 1929  Date of referral:  10/01/16               Reason for consult:  Facility Placement                Permission sought to share information with:    Permission granted to share information::     Name::      Diane Fry  Agency::  [facility]  Relationship::  Daughter  Contact Information:  [phone]  Housing/Transportation Living arrangements for the past 2 months:  [place] of Information:  Patient, Adult Children Patient Interpreter Needed:  None Criminal Activity/Legal Involvement Pertinent to Current Situation/Hospitalization:  No - Comment as needed Significant Relationships:  Adult Children Lives with:  Self Do you feel safe going back to the place where you live?  No Need for family participation in patient care:  Yes (Comment)  Care giving concerns:  Patient has hip fracture and will need rehab before retuning to home and work. Patient reports she is interested in [place] for treatment. Patient states she has had several family member there and the facility is close to her home.    Social Worker assessment / plan:  Csw met with patient, and daughter. Both are agreeable to SNF placement. Patient daughter reports she lives in [place] and it has been difficult to manage patient care from [place]. She reports the patient is very independent and still works at [employer]. They are hopeful [facility] will make a bed offer.   Employment status:  Part-Time Nurse, adult PT Recommendations:  [place] / Referral to community resources:  [place]  Patient/Family's Response to care:  Responding and well and agreeable to care.   Patient/Family's Understanding of and Emotional Response to Diagnosis, Current Treatment, and Prognosis:  Daughter reports going to  [facility] would help comfort her in knowing that her mother was getting the best care.   Emotional Assessment Appearance:    Attitude/Demeanor/Rapport:    Affect (typically observed):  Accepting, Pleasant Orientation:  Oriented to Self, Oriented to Place, Oriented to  Time, Oriented to Situation Alcohol / Substance use:  Not Applicable Psych involvement (Current and /or in the community):  No (Comment)  Discharge Needs  Concerns to be addressed:  Discharge Planning Concerns Readmission within the last 30 days:  No Current discharge risk:  Dependent with Mobility Barriers to Discharge:  Continued Medical Work up   [employer], LCSW 10/01/2016, 1:31 PM

## 2016-10-02 LAB — CBC
HEMATOCRIT: 23.7 % — AB (ref 36.0–46.0)
Hemoglobin: 7.8 g/dL — ABNORMAL LOW (ref 12.0–15.0)
MCH: 30 pg (ref 26.0–34.0)
MCHC: 32.9 g/dL (ref 30.0–36.0)
MCV: 91.2 fL (ref 78.0–100.0)
PLATELETS: 144 10*3/uL — AB (ref 150–400)
RBC: 2.6 MIL/uL — AB (ref 3.87–5.11)
RDW: 16 % — ABNORMAL HIGH (ref 11.5–15.5)
WBC: 5.5 10*3/uL (ref 4.0–10.5)

## 2016-10-02 LAB — BASIC METABOLIC PANEL
ANION GAP: 5 (ref 5–15)
BUN: 22 mg/dL — ABNORMAL HIGH (ref 6–20)
CHLORIDE: 104 mmol/L (ref 101–111)
CO2: 30 mmol/L (ref 22–32)
Calcium: 7.6 mg/dL — ABNORMAL LOW (ref 8.9–10.3)
Creatinine, Ser: 1.32 mg/dL — ABNORMAL HIGH (ref 0.44–1.00)
GFR calc non Af Amer: 35 mL/min — ABNORMAL LOW (ref >60)
GFR, EST AFRICAN AMERICAN: 41 mL/min — AB (ref >60)
Glucose, Bld: 140 mg/dL — ABNORMAL HIGH (ref 65–99)
POTASSIUM: 3.8 mmol/L (ref 3.5–5.1)
SODIUM: 139 mmol/L (ref 135–145)

## 2016-10-02 LAB — GLUCOSE, CAPILLARY
GLUCOSE-CAPILLARY: 165 mg/dL — AB (ref 65–99)
Glucose-Capillary: 140 mg/dL — ABNORMAL HIGH (ref 65–99)
Glucose-Capillary: 124 mg/dL — ABNORMAL HIGH (ref 65–99)
Glucose-Capillary: 157 mg/dL — ABNORMAL HIGH (ref 65–99)
Glucose-Capillary: 137 mg/dL — ABNORMAL HIGH (ref 65–99)

## 2016-10-02 LAB — PREPARE RBC (CROSSMATCH)

## 2016-10-02 LAB — MAGNESIUM: Magnesium: 1.6 mg/dL — ABNORMAL LOW (ref 1.7–2.4)

## 2016-10-02 MED ORDER — SODIUM CHLORIDE 0.9 % IV SOLN
Freq: Once | INTRAVENOUS | Status: AC
  Administered 2016-10-02: 12:00:00 via INTRAVENOUS

## 2016-10-02 MED ORDER — ASPIRIN 81 MG PO CHEW
81.0000 mg | CHEWABLE_TABLET | Freq: Every day | ORAL | Status: DC
  Administered 2016-10-02: 81 mg via ORAL
  Filled 2016-10-02: qty 1

## 2016-10-02 MED ORDER — MAGNESIUM SULFATE 2 GM/50ML IV SOLN
2.0000 g | Freq: Once | INTRAVENOUS | Status: AC
  Administered 2016-10-02: 2 g via INTRAVENOUS
  Filled 2016-10-02: qty 50

## 2016-10-02 NOTE — Progress Notes (Signed)
PROGRESS NOTE                                                                                                                                                                                                             Patient Demographics:    Diane Fry, is a 83 y.o. female, DOB - 25-Sep-1928, FHL:[id number]  Admit date - 09/30/2016   Admitting Physician Nicholes Stairs, MD  Outpatient Primary MD for the patient is ROBERTS, Sharol Given, MD  LOS - 2  Chief Complaint  Patient presents with  . Fall  . Hip Pain       Brief Narrative   83 y.o. female with medical history significant of CAD, S/P Stent 2016, anxiety, DM, PAF,  who presents With mechanical fall and left hip fracture, status post surgical repair 4/12 by Dr.Rogers   Subjective:    Diane Fry today has, No headache, No chest pain, No abdominal pain - No Nausea, Reports her hip pain is controlled  Assessment  & Plan :    Active Problems:   Coronary atherosclerosis of native coronary artery   Type 2 diabetes mellitus (HCC)   Paroxysmal atrial fibrillation (HCC)   Closed left hip fracture, initial encounter (HCC)   Left Hip fracture - Secondary to mechanical fall, status post surgical repair by Dr. Leona Carry, seen by PT, recommendation is for SNF, continue with when necessary pain medication.  Postoperative anemia - Hemoglobin 7.8 today, Will transfuse 1 unit PRBCs patient in the setting of her cardiac history, Plavix has been stopped, will continue to monitor closely on aspirin, repeat CBC in a.m. Marland Kitchen  CAD - She denies any chest pain or shortness of breath, continue statin, she is on aspirin and Plavix at home , her stent was placed in May 2016, discussed with Dr. Terrence Dupont, aspirin and Plavix can be stopped in favor of initiating s the setting of known history of A. fib and need for DVT prophylaxis, but given her significant anemia, I will stop Plavix, continue with aspirin, and  hold on initiating Eliquis .  HTN - Continue to hold Cozaar given soft blood pressure , continue with metoprolol and Imdur  DM - hold metformin, will order SSI.   Chronic Diastolic heart Failure.  - On lasix 80 mg  in am and 40 mg , creatinine 1.37 today, will  hold Lasix   PAF -on amiodarone 100 mg Twice a week, which will be stopped given prolonged QTC as discussed with Dr. Terrence Dupont. - will hold on starting Eliquis as postoperative anemia with hemoglobin of 7.8  Hypothyroidism - Continue with synthroid.   Prolonged QTc - QTc is 592 on admission, will repeat potassium and magnesium, resolved on repeat EKG , will stop amiodarone as D/W Dr Terrence Dupont.  Hypomagnesemia  - Repleted, recheck in a.m.    Code Status : Ful  Family Communication  : none at bedside  Disposition Plan  : will need SNF placement  Consults  :  Ortho  Procedures  : left hip surgical repair  DVT Prophylaxis  SCD, no chemical anticoagulation currently given postoperative anemia   Lab Results  Component Value Date   PLT 144 (L) 10/02/2016    Antibiotics  :    Anti-infectives    Start     Dose/Rate Route Frequency Ordered Stop   10/01/16 0600  ceFAZolin (ANCEF) IVPB 2g/100 mL premix     2 g 200 mL/hr over 30 Minutes Intravenous On call to O.R. 09/30/16 1652 09/30/16 1811   10/01/16 0000  ceFAZolin (ANCEF) IVPB 2g/100 mL premix     2 g 200 mL/hr over 30 Minutes Intravenous Every 6 hours 09/30/16 2103 10/01/16 1119   09/30/16 1736  ceFAZolin (ANCEF) 2-4 GM/100ML-% IVPB    Comments:  Bridget Hartshorn   : cabinet override      09/30/16 1736 10/01/16 0544        Objective:   Vitals:   10/02/16 1145 10/02/16 1203 10/02/16 1348 10/02/16 1419  BP: (!) 105/42 (!) 103/49 (!) 105/53 (!) 119/46  Pulse: 79 76 75 74  Resp:   15   Temp: 98 F (36.7 C) 98 F (36.7 C) 98.2 F (36.8 C) 98 F (36.7 C)  TempSrc: Axillary Axillary Oral Axillary  SpO2: 95% 95% 92% 93%  Weight:      Height:        Wt  Readings from Last 3 Encounters:  09/30/16 76.7 kg (169 lb)  11/28/14 75.3 kg (166 lb)  11/01/14 78.4 kg (172 lb 13.5 oz)     Intake/Output Summary (Last 24 hours) at 10/02/16 1512 Last data filed at 10/02/16 1419  Gross per 24 hour  Intake             2500 ml  Output              250 ml  Net             2250 ml     Physical Exam  Awake Alert, Oriented X 3,  Supple Neck,No JVD, N Symmetrical Chest wall movement, Good air movement bilaterally, CTAB RRR,No Gallops,Rubs or new Murmurs, No Parasternal Heave +ve B.Sounds, Abd Soft, No tenderness,No rebound - guarding or rigidity. No Cyanosis, Clubbing or edema, No new Rash or bruise      Data Review:    CBC  Recent Labs Lab 09/30/16 1233 10/01/16 0755 10/02/16 0516  WBC 9.5 5.8 5.5  HGB 11.6* 8.4* 7.8*  HCT 34.7* 25.0* 23.7*  PLT 166 124* 144*  MCV 91.1 92.3 91.2  MCH 30.4 31.0 30.0  MCHC 33.4 33.6 32.9  RDW 15.5 16.1* 16.0*  LYMPHSABS 0.9  --   --   MONOABS 0.5  --   --   EOSABS 0.0  --   --   BASOSABS 0.0  --   --     Chemistries  Recent Labs Lab 09/30/16 1233 09/30/16 2133 10/01/16 0755 10/02/16 0516  NA 142  --  140 139  K 3.1*  --  3.7 3.8  CL 102  --  104 104  CO2 29  --  29 30  GLUCOSE 159*  --  111* 140*  BUN 36*  --  24* 22*  CREATININE 1.37* 1.24* 1.15* 1.32*  CALCIUM 9.0  --  7.8* 7.6*  MG  --   --   --  1.6*   ------------------------------------------------------------------------------------------------------------------ No results for input(s): CHOL, HDL, LDLCALC, TRIG, CHOLHDL, LDLDIRECT in the last 72 hours.  Lab Results  Component Value Date   HGBA1C 6.5 (H) 09/08/2013   ------------------------------------------------------------------------------------------------------------------ No results for input(s): TSH, T4TOTAL, T3FREE, THYROIDAB in the last 72 hours.  Invalid input(s):  FREET3 ------------------------------------------------------------------------------------------------------------------ No results for input(s): VITAMINB12, FOLATE, FERRITIN, TIBC, IRON, RETICCTPCT in the last 72 hours.  Coagulation profile  Recent Labs Lab 09/30/16 1233  INR 1.11    No results for input(s): DDIMER in the last 72 hours.  Cardiac Enzymes No results for input(s): CKMB, TROPONINI, MYOGLOBIN in the last 168 hours.  Invalid input(s): CK ------------------------------------------------------------------------------------------------------------------    Component Value Date/Time   BNP 154.3 (H) 11/27/2014 0405    Inpatient Medications  Scheduled Meds: . aspirin  81 mg Oral Daily  . atorvastatin  40 mg Oral Daily  . docusate sodium  100 mg Oral BID  . feeding supplement (ENSURE ENLIVE)  237 mL Oral BID BM  . insulin aspart  0-9 Units Subcutaneous TID WC  . isosorbide mononitrate  30 mg Oral q morning - 10a  . metoprolol tartrate  12.5 mg Oral BID  . omega-3 acid ethyl esters  1 g Oral Daily   Continuous Infusions: . lactated ringers 75 mL/hr at 10/01/16 1047   PRN Meds:.acetaminophen **OR** acetaminophen, ALPRAZolam, HYDROcodone-acetaminophen, methocarbamol **OR** methocarbamol (ROBAXIN)  IV, metoCLOPramide **OR** metoCLOPramide (REGLAN) injection, morphine injection, nitroGLYCERIN, ondansetron **OR** ondansetron (ZOFRAN) IV, polyvinyl alcohol  Micro Results No results found for this or any previous visit (from the past 240 hour(s)).  Radiology Reports Dg Chest 1 View  Result Date: 09/30/2016 CLINICAL DATA:  Unwitnessed fall. EXAM: CHEST 1 VIEW COMPARISON:  Radiographs of August 02, 2016. FINDINGS: Stable cardiomediastinal silhouette. Atherosclerosis of thoracic aorta is noted. No pneumothorax or pleural effusion is noted. Mild left lateral basilar opacity is noted concerning for scarring or atelectasis. Focal atelectasis or scarring is noted in right  suprahilar region. Bony thorax is unremarkable. IMPRESSION: Aortic atherosclerosis. Probable scarring or atelectasis seen in left basilar and right suprahilar regions. Electronically Signed   By: Marijo Conception, M.D.   On: 09/30/2016 12:25   Dg Knee 1-2 Views Left  Result Date: 09/30/2016 CLINICAL DATA:  Fall with left hip fracture. EXAM: LEFT KNEE - 1-2 VIEW COMPARISON:  None. FINDINGS: Negative for fracture or joint effusion. Normal alignment as permitted by angle of imaging. No notable degenerative changes. Small, incidental bony excrescence from the medial tibial metaphysis. IMPRESSION: Negative. Electronically Signed   By: Monte Fantasia M.D.   On: 09/30/2016 12:21   Dg Tibia/fibula Left  Result Date: 09/30/2016 CLINICAL DATA:  Fall with left leg foreshortening. Shin pain. Initial encounter. EXAM: LEFT TIBIA AND FIBULA - 2 VIEW COMPARISON:  None. FINDINGS: No acute fracture or dislocation.  Osteopenia and atherosclerosis. IMPRESSION: No acute finding. Electronically Signed   By: Monte Fantasia M.D.   On: 09/30/2016 12:22   Dg Humerus Left  Result Date: 09/30/2016 CLINICAL DATA:  Left arm pain after fall. EXAM: LEFT HUMERUS - 2+ VIEW COMPARISON:  None. FINDINGS: There is no evidence of fracture or other focal bone lesions. Soft tissues are unremarkable. IMPRESSION: Normal left humerus. Electronically Signed   By: Marijo Conception, M.D.   On: 09/30/2016 12:27   Dg C-arm 61-120 Min-no Report  Result Date: 09/30/2016 Fluoroscopy was utilized by the requesting physician.  No radiographic interpretation.   Dg Hip Operative Unilat W Or W/o Pelvis Left  Result Date: 09/30/2016 CLINICAL DATA:  Internal fixation of left femoral fracture. Initial encounter. EXAM: OPERATIVE LEFT HIP WITH PELVIS COMPARISON:  Left hip radiographs performed earlier today at 12:07 p.m. FINDINGS: Four fluoroscopic C-arm images are provided from the OR. These demonstrate placement of an intramedullary nail and screw through  the left femur, transfixing the left femoral intertrochanteric fracture in grossly anatomic alignment. The left femoral head remains seated at the acetabulum. No new fractures are seen. IMPRESSION: Status post internal fixation of left femoral intertrochanteric fracture in grossly anatomic alignment. Electronically Signed   By: Garald Balding M.D.   On: 09/30/2016 23:56   Dg Hip Unilat With Pelvis 2-3 Views Left  Result Date: 09/30/2016 CLINICAL DATA:  Fall with left hip pain.  Initial encounter. EXAM: DG HIP (WITH OR WITHOUT PELVIS) 2-3V LEFT COMPARISON:  None. FINDINGS: Acute intertrochanteric left femur fracture with varus angulation. Both hips are located. No evidence of pelvic ring fracture. Osteopenia and atherosclerosis. IMPRESSION: Acute intertrochanteric left femur fracture with varus angulation. Electronically Signed   By: Monte Fantasia M.D.   On: 09/30/2016 12:20     Jalayia Bagheri M.D on 10/02/2016 at 3:12 PM  Between 7am to 7pm - Pager - [phone]  After 7pm go to [email or web address] - password [facility]  [facility] -  Office  [phone]

## 2016-10-02 NOTE — Progress Notes (Signed)
Physical Therapy Treatment Patient Details Name: Diane Fry MRN: [id number] DOB: March 08, 1929 Today's Date: 10/02/2016    History of Present Illness pt sustained a L hip fx and is s/p IM nail.  PMH:  CAD, stent, anxiety, PAF, DM    PT Comments    Pt motivated and progressing steadily with mobility.   Follow Up Recommendations  SNF     Equipment Recommendations  Rolling walker with 5" wheels    Recommendations for Other Services OT consult     Precautions / Restrictions Precautions Precautions: Fall Restrictions Weight Bearing Restrictions: No Other Position/Activity Restrictions: WBAT    Mobility  Bed Mobility               General bed mobility comments: NT - OOB to [facility] with CNA  Transfers Overall transfer level: Needs assistance Equipment used: Rolling walker (2 wheeled) Transfers: Sit to/from Stand Sit to Stand: Min assist;From elevated surface         General transfer comment: assist to rise and steady.  Cues for UE/LE placement  Ambulation/Gait Ambulation/Gait assistance: Min assist Ambulation Distance (Feet): 48 Feet Assistive device: Rolling walker (2 wheeled) Gait Pattern/deviations: Step-to pattern;Decreased step length - right;Decreased step length - left;Shuffle;Trunk flexed Gait velocity: decr Gait velocity interpretation: Below normal speed for age/gender General Gait Details: cues for sequence, posture and position from RW.  Physicial assist for balance/support    Stairs            Wheelchair Mobility    Modified Rankin (Stroke Patients Only)       Balance Overall balance assessment: Needs assistance Sitting-balance support: No upper extremity supported;Feet supported Sitting balance-Leahy Scale: Good     Standing balance support: Bilateral upper extremity supported Standing balance-Leahy Scale: Poor Standing balance comment: post drift                            Cognition Arousal/Alertness:  Awake/alert Behavior During Therapy: WFL for tasks assessed/performed Overall Cognitive Status: Within Functional Limits for tasks assessed                                        Exercises      General Comments        Pertinent Vitals/Pain Pain Assessment: 0-10 Pain Score: 5  Pain Location: L hip Pain Descriptors / Indicators: Aching Pain Intervention(s): Limited activity within patient's tolerance;Monitored during session;Premedicated before session;Ice applied    Home Living                      Prior Function            PT Goals (current goals can now be found in the care plan section) Acute Rehab PT Goals Patient Stated Goal: decreased pain, return to independence PT Goal Formulation: With patient Time For Goal Achievement: 10/08/16 Potential to Achieve Goals: Fair Progress towards PT goals: Progressing toward goals    Frequency    Min 4X/week      PT Plan Current plan remains appropriate    Co-evaluation             End of Session Equipment Utilized During Treatment: Gait belt Activity Tolerance: Patient tolerated treatment well;Patient limited by pain;Patient limited by fatigue Patient left: with call bell/phone within reach;in chair;with chair alarm set;with family/visitor present Nurse Communication: Mobility status PT Visit Diagnosis:  Unsteadiness on feet (R26.81);History of falling (Z91.81);Muscle weakness (generalized) (M62.81);Difficulty in walking, not elsewhere classified (R26.2);Pain Pain - Right/Left: Left Pain - part of body: Hip     Time: 2951-8841 PT Time Calculation (min) (ACUTE ONLY): 23 min  Charges:  $Gait Training: 23-37 mins                    G Codes:       Pg [phone]    Tahjai Schetter 10/02/2016, 4:12 PM

## 2016-10-02 NOTE — Progress Notes (Signed)
Physical Therapy Treatment Patient Details Name: Diane Fry MRN: [id number] DOB: [birth year] Today's Date: 10/02/2016    History of Present Illness pt sustained a L hip fx and is s/p IM nail.  PMH:  CAD, stent, anxiety, PAF, DM    PT Comments    Pt very motivated and demonstrating good progress with mobility.   Follow Up Recommendations  SNF     Equipment Recommendations  Rolling walker with 5" wheels    Recommendations for Other Services OT consult     Precautions / Restrictions Precautions Precautions: Fall Restrictions Weight Bearing Restrictions: No Other Position/Activity Restrictions: WBAT    Mobility  Bed Mobility Overal bed mobility: Needs Assistance Bed Mobility: Supine to Sit     Supine to sit: Mod assist     General bed mobility comments: assist for legs and trunk  Transfers Overall transfer level: Needs assistance Equipment used: Rolling walker (2 wheeled) Transfers: Sit to/from Stand Sit to Stand: Min assist;Mod assist;From elevated surface         General transfer comment: assist to rise and steady.  Cues for UE/LE placement  Ambulation/Gait Ambulation/Gait assistance: Min assist;Mod assist Ambulation Distance (Feet): 25 Feet Assistive device: Rolling walker (2 wheeled) Gait Pattern/deviations: Step-to pattern;Decreased step length - right;Decreased step length - left;Shuffle;Trunk flexed Gait velocity: decr Gait velocity interpretation: Below normal speed for age/gender General Gait Details: cues for sequence, posture and position from RW.  Physicial assist for balance/support    Stairs            Wheelchair Mobility    Modified Rankin (Stroke Patients Only)       Balance Overall balance assessment: Needs assistance Sitting-balance support: No upper extremity supported;Feet supported Sitting balance-Leahy Scale: Good     Standing balance support: Bilateral upper extremity supported Standing balance-Leahy Scale:  Poor Standing balance comment: post drift                            Cognition Arousal/Alertness: Awake/alert Behavior During Therapy: WFL for tasks assessed/performed Overall Cognitive Status: Within Functional Limits for tasks assessed                                        Exercises General Exercises - Lower Extremity Ankle Circles/Pumps: Both;15 reps;Supine Quad Sets: AROM;Both;5 reps;Supine Heel Slides: AAROM;Left;Supine;15 reps Hip ABduction/ADduction: AAROM;Left;15 reps;Supine    General Comments        Pertinent Vitals/Pain Pain Assessment: 0-10 Pain Score: 5  Pain Location: L hip Pain Descriptors / Indicators: Aching Pain Intervention(s): Limited activity within patient's tolerance;Monitored during session;Premedicated before session;Ice applied    Home Living                      Prior Function            PT Goals (current goals can now be found in the care plan section) Acute Rehab PT Goals Patient Stated Goal: decreased pain, return to independence PT Goal Formulation: With patient Time For Goal Achievement: 10/08/16 Potential to Achieve Goals: Fair Progress towards PT goals: Progressing toward goals    Frequency    Min 4X/week      PT Plan Current plan remains appropriate    Co-evaluation             End of Session Equipment Utilized During Treatment: Gait belt Activity Tolerance:  Patient tolerated treatment well;Patient limited by pain;Patient limited by fatigue Patient left: with call bell/phone within reach;in chair;with chair alarm set Nurse Communication: Mobility status PT Visit Diagnosis: Unsteadiness on feet (R26.81);History of falling (Z91.81);Muscle weakness (generalized) (M62.81);Difficulty in walking, not elsewhere classified (R26.2);Pain Pain - Right/Left: Left Pain - part of body: Hip     Time: 0810-0840 PT Time Calculation (min) (ACUTE ONLY): 30 min  Charges:  $Gait Training: 8-22  mins $Therapeutic Exercise: 8-22 mins                    G Codes:       Pg [phone]    Janah Mcculloh 10/02/2016, 10:35 AM

## 2016-10-02 NOTE — Progress Notes (Signed)
   Subjective: 2 Days Post-Op Procedure(s) (LRB): INTRAMEDULLARY (IM) NAIL FEMORAL (Left) Patient reports pain as mild and moderate.   Patient seen in rounds for Dr. Stann Mainland.  She is sitting up in bed eating breakfast. Patient is well, but has had some minor complaints of pain in the hip and thigh, requiring pain medications Plan is to go Skilled nursing facility after hospital stay.  Objective: Vital signs in last 24 hours: Temp:  [97.7 F (36.5 C)-99.4 F (37.4 C)] 99.4 F (37.4 C) (04/14 0535) Pulse Rate:  [77-88] 85 (04/14 0535) Resp:  [12-18] 18 (04/14 0535) BP: (110-122)/(49-61) 119/55 (04/14 0535) SpO2:  [95 %-100 %] 96 % (04/14 0535)  Intake/Output from previous day:  Intake/Output Summary (Last 24 hours) at 10/02/16 0755 Last data filed at 10/02/16 0540  Gross per 24 hour  Intake             2200 ml  Output              350 ml  Net             1850 ml    Intake/Output this shift: No intake/output data recorded.  Labs:  Recent Labs  09/30/16 1233 10/01/16 0755 10/02/16 0516  HGB 11.6* 8.4* 7.8*    Recent Labs  10/01/16 0755 10/02/16 0516  WBC 5.8 5.5  RBC 2.71* 2.60*  HCT 25.0* 23.7*  PLT 124* 144*    Recent Labs  10/01/16 0755 10/02/16 0516  NA 140 139  K 3.7 3.8  CL 104 104  CO2 29 30  BUN 24* 22*  CREATININE 1.15* 1.32*  GLUCOSE 111* 140*  CALCIUM 7.8* 7.6*    Recent Labs  09/30/16 1233  INR 1.11    EXAM General - Patient is Alert and Appropriate Extremity - Neurovascular intact Sensation intact distally Intact pulses distally Dorsiflexion/Plantar flexion intact Dressing/Incision - clean, dry Motor Function - intact, moving foot and toes well on exam.   Past Medical History:  Diagnosis Date  . Anxiety   . Coronary artery disease   . Diabetes mellitus without complication ([place])   . Hypertension   . Pneumonia 2015    Assessment/Plan: 2 Days Post-Op Procedure(s) (LRB): INTRAMEDULLARY (IM) NAIL FEMORAL (Left) Active  Problems:   Coronary atherosclerosis of native coronary artery   Type 2 diabetes mellitus (HCC)   Paroxysmal atrial fibrillation (HCC)   Closed left hip fracture, initial encounter ([place])  Estimated body mass index is 29.94 kg/m as calculated from the following:   Height as of this encounter: 5\' 3"  (1.6 m).   Weight as of this encounter: 76.7 kg (169 lb). Up with therapy Discharge to SNF when medically stable, probably Monday??  DVT Prophylaxis - Plavix and Aspirin, and SCDs in house Weight Bearing As Tolerated left Leg  HGB is down to 7.8 (HGB 11.6 pre op) Slight bump in creatinine to 1.32 Will defer to medical team - may benefit from blood due to anemia and cardiac history.  Arlee Muslim, PA-C Orthopaedic Surgery 10/02/2016, 7:55 AM

## 2016-10-03 LAB — CBC
HEMATOCRIT: 28.4 % — AB (ref 36.0–46.0)
Hemoglobin: 9.5 g/dL — ABNORMAL LOW (ref 12.0–15.0)
MCH: 30.4 pg (ref 26.0–34.0)
MCHC: 33.5 g/dL (ref 30.0–36.0)
MCV: 91 fL (ref 78.0–100.0)
PLATELETS: 170 10*3/uL (ref 150–400)
RBC: 3.12 MIL/uL — ABNORMAL LOW (ref 3.87–5.11)
RDW: 17.3 % — AB (ref 11.5–15.5)
WBC: 7.2 10*3/uL (ref 4.0–10.5)

## 2016-10-03 LAB — BASIC METABOLIC PANEL
ANION GAP: 8 (ref 5–15)
BUN: 27 mg/dL — ABNORMAL HIGH (ref 6–20)
CALCIUM: 8.1 mg/dL — AB (ref 8.9–10.3)
CO2: 30 mmol/L (ref 22–32)
Chloride: 100 mmol/L — ABNORMAL LOW (ref 101–111)
Creatinine, Ser: 1.07 mg/dL — ABNORMAL HIGH (ref 0.44–1.00)
GFR calc Af Amer: 53 mL/min — ABNORMAL LOW (ref >60)
GFR, EST NON AFRICAN AMERICAN: 46 mL/min — AB (ref >60)
GLUCOSE: 131 mg/dL — AB (ref 65–99)
POTASSIUM: 4.1 mmol/L (ref 3.5–5.1)
SODIUM: 138 mmol/L (ref 135–145)

## 2016-10-03 LAB — GLUCOSE, CAPILLARY
GLUCOSE-CAPILLARY: 126 mg/dL — AB (ref 65–99)
GLUCOSE-CAPILLARY: 131 mg/dL — AB (ref 65–99)
GLUCOSE-CAPILLARY: 135 mg/dL — AB (ref 65–99)
Glucose-Capillary: 150 mg/dL — ABNORMAL HIGH (ref 65–99)

## 2016-10-03 MED ORDER — APIXABAN 5 MG PO TABS
5.0000 mg | ORAL_TABLET | Freq: Two times a day (BID) | ORAL | Status: DC
  Administered 2016-10-03 – 2016-10-04 (×3): 5 mg via ORAL
  Filled 2016-10-03 (×3): qty 1

## 2016-10-03 NOTE — Progress Notes (Signed)
   Subjective: 3 Days Post-Op Procedure(s) (LRB): INTRAMEDULLARY (IM) NAIL FEMORAL (Left) Patient reports pain as mild.   Patient seen in rounds with Dr. Wynelle Link. Patient is well, but has had some minor complaints of pain in the hip, requiring pain medications We will resume therapy today.  Plan is to go Skilled nursing facility after hospital stay.  Objective: Vital signs in last 24 hours: Temp:  [98 F (36.7 C)-98.5 F (36.9 C)] 98.1 F (36.7 C) (04/15 0525) Pulse Rate:  [73-81] 73 (04/15 0525) Resp:  [15-20] 20 (04/15 0525) BP: (103-137)/(42-65) 137/65 (04/15 0525) SpO2:  [92 %-97 %] 93 % (04/15 0525)  Intake/Output from previous day: 04/14 0701 - 04/15 0700 In: 1310 [P.O.:960; Blood:300; IV Piggyback:50] Out: 350 [Urine:350] Intake/Output this shift: No intake/output data recorded.   Recent Labs  09/30/16 1233 10/01/16 0755 10/02/16 0516 10/03/16 0433  HGB 11.6* 8.4* 7.8* 9.5*    Recent Labs  10/02/16 0516 10/03/16 0433  WBC 5.5 7.2  RBC 2.60* 3.12*  HCT 23.7* 28.4*  PLT 144* 170    Recent Labs  10/02/16 0516 10/03/16 0433  NA 139 138  K 3.8 4.1  CL 104 100*  CO2 30 30  BUN 22* 27*  CREATININE 1.32* 1.07*  GLUCOSE 140* 131*  CALCIUM 7.6* 8.1*    Recent Labs  09/30/16 1233  INR 1.11    EXAM General - Patient is Alert and Appropriate Extremity - Neurovascular intact Sensation intact distally Intact pulses distally Dorsiflexion/Plantar flexion intact Dressing - dressing C/D/I Motor Function - intact, moving foot and toes well on exam.   Past Medical History:  Diagnosis Date  . Anxiety   . Coronary artery disease   . Diabetes mellitus without complication ([place])   . Hypertension   . Pneumonia 2015    Assessment/Plan: 3 Days Post-Op Procedure(s) (LRB): INTRAMEDULLARY (IM) NAIL FEMORAL (Left) Active Problems:   Coronary atherosclerosis of native coronary artery   Type 2 diabetes mellitus (HCC)   Paroxysmal atrial fibrillation  (HCC)   Closed left hip fracture, initial encounter ([place])  Estimated body mass index is 29.94 kg/m as calculated from the following:   Height as of this encounter: 5\' 3"  (1.6 m).   Weight as of this encounter: 76.7 kg (169 lb). Up with therapy Discharge to SNF  HGB - 9.5 after blood  Arlee Muslim, PA-C Orthopaedic Surgery 10/03/2016, 7:52 AM

## 2016-10-03 NOTE — Progress Notes (Signed)
PROGRESS NOTE                                                                                                                                                                                                             Patient Demographics:    Diane Fry, is a 83 y.o. female, DOB - December 28, 1928, NAT:[id number]  Admit date - 09/30/2016   Admitting Physician Nicholes Stairs, MD  Outpatient Primary MD for the patient is ROBERTS, Sharol Given, MD  LOS - 3  Chief Complaint  Patient presents with  . Fall  . Hip Pain       Brief Narrative   83 y.o. female with medical history significant of CAD, S/P Stent 2016, anxiety, DM, PAF,  who presents With mechanical fall and left hip fracture, status post surgical repair 4/12 by Dr.Rogers   Subjective:    Diane Fry today has, No headache, No chest pain, No abdominal pain - No Nausea, Reports her hip pain is controlled  Assessment  & Plan :    Active Problems:   Coronary atherosclerosis of native coronary artery   Type 2 diabetes mellitus (HCC)   Paroxysmal atrial fibrillation (HCC)   Closed left hip fracture, initial encounter (HCC)   Left Hip fracture - Secondary to mechanical fall, status post surgical repair by Dr. Leona Carry, seen by PT, recommendation is for SNF, continue with when necessary pain medication.  Postoperative anemia - Hemoglobin as low as 7.8, required 1 unit PRBC transfusion, today is 9.5,. - Discussed with orthopedic PA Dara Lords, there is no indication at this time to start full anticoagulation with Eliquis for A. Fib  CAD - She denies any chest pain or shortness of breath, continue statin, she is on aspirin and Plavix at home , her stent was placed in May 2016, discussed with Dr. Terrence Dupont, aspirin and Plavix can be stopped in favor of initiating s the setting of known history of A. fib and need for DVT prophylaxis, started on Eliquis.  HTN - Continue to hold Cozaar given soft  blood pressure , continue with metoprolol and Imdur  DM - hold metformin, will order SSI.   Chronic Diastolic heart Failure.  - On lasix 80 mg  in am and 40 mg , creatinine 1.37 today, will hold Lasix   PAF -on amiodarone 100 mg Twice a week,  which will be stopped given prolonged QTC as discussed with Dr. Terrence Dupont. - will hold on starting Eliquis as postoperative anemia with hemoglobin of 7.8  Hypothyroidism - Continue with synthroid.   Prolonged QTc - QTc is 592 on admission, will repeat potassium and magnesium, resolved on repeat EKG , will stop amiodarone as D/W Dr Terrence Dupont.  Hypomagnesemia  - Repleted,     Code Status : Ful  Family Communication  : Scars with daughter via phone  Disposition Plan  :  SNF placement in a.m. if hemoglobin is stable  Consults  :  Ortho  Procedures  : left hip surgical repair  DVT Prophylaxis  SCD, Eliquis  Lab Results  Component Value Date   PLT 170 10/03/2016    Antibiotics  :    Anti-infectives    Start     Dose/Rate Route Frequency Ordered Stop   10/01/16 0600  ceFAZolin (ANCEF) IVPB 2g/100 mL premix     2 g 200 mL/hr over 30 Minutes Intravenous On call to O.R. 09/30/16 1652 09/30/16 1811   10/01/16 0000  ceFAZolin (ANCEF) IVPB 2g/100 mL premix     2 g 200 mL/hr over 30 Minutes Intravenous Every 6 hours 09/30/16 2103 10/01/16 1119   09/30/16 1736  ceFAZolin (ANCEF) 2-4 GM/100ML-% IVPB    Comments:  Bridget Hartshorn   : cabinet override      09/30/16 1736 10/01/16 0544        Objective:   Vitals:   10/02/16 1348 10/02/16 1419 10/02/16 2155 10/03/16 0525  BP: (!) 105/53 (!) 119/46 133/62 137/65  Pulse: 75 74 81 73  Resp: 15  20 20   Temp: 98.2 F (36.8 C) 98 F (36.7 C) 98.5 F (36.9 C) 98.1 F (36.7 C)  TempSrc: Oral Axillary Oral Oral  SpO2: 92% 93% 97% 93%  Weight:      Height:        Wt Readings from Last 3 Encounters:  09/30/16 76.7 kg (169 lb)  11/28/14 75.3 kg (166 lb)  11/01/14 78.4 kg (172 lb 13.5 oz)       Intake/Output Summary (Last 24 hours) at 10/03/16 1338 Last data filed at 10/03/16 1000  Gross per 24 hour  Intake              830 ml  Output              550 ml  Net              280 ml     Physical Exam  Awake Alert, Oriented X 3,  Supple Neck,No JVD, N Symmetrical Chest wall movement, Good air movement bilaterally, CTAB RRR,No Gallops,Rubs or new Murmurs, No Parasternal Heave +ve B.Sounds, Abd Soft, No tenderness,No rebound - guarding or rigidity. No Cyanosis, Clubbing or edema, No new Rash or bruise      Data Review:    CBC  Recent Labs Lab 09/30/16 1233 10/01/16 0755 10/02/16 0516 10/03/16 0433  WBC 9.5 5.8 5.5 7.2  HGB 11.6* 8.4* 7.8* 9.5*  HCT 34.7* 25.0* 23.7* 28.4*  PLT 166 124* 144* 170  MCV 91.1 92.3 91.2 91.0  MCH 30.4 31.0 30.0 30.4  MCHC 33.4 33.6 32.9 33.5  RDW 15.5 16.1* 16.0* 17.3*  LYMPHSABS 0.9  --   --   --   MONOABS 0.5  --   --   --   EOSABS 0.0  --   --   --   BASOSABS 0.0  --   --   --  Chemistries   Recent Labs Lab 09/30/16 1233 09/30/16 2133 10/01/16 0755 10/02/16 0516 10/03/16 0433  NA 142  --  140 139 138  K 3.1*  --  3.7 3.8 4.1  CL 102  --  104 104 100*  CO2 29  --  29 30 30   GLUCOSE 159*  --  111* 140* 131*  BUN 36*  --  24* 22* 27*  CREATININE 1.37* 1.24* 1.15* 1.32* 1.07*  CALCIUM 9.0  --  7.8* 7.6* 8.1*  MG  --   --   --  1.6*  --    ------------------------------------------------------------------------------------------------------------------ No results for input(s): CHOL, HDL, LDLCALC, TRIG, CHOLHDL, LDLDIRECT in the last 72 hours.  Lab Results  Component Value Date   HGBA1C 6.5 (H) 09/08/2013   ------------------------------------------------------------------------------------------------------------------ No results for input(s): TSH, T4TOTAL, T3FREE, THYROIDAB in the last 72 hours.  Invalid input(s):  FREET3 ------------------------------------------------------------------------------------------------------------------ No results for input(s): VITAMINB12, FOLATE, FERRITIN, TIBC, IRON, RETICCTPCT in the last 72 hours.  Coagulation profile  Recent Labs Lab 09/30/16 1233  INR 1.11    No results for input(s): DDIMER in the last 72 hours.  Cardiac Enzymes No results for input(s): CKMB, TROPONINI, MYOGLOBIN in the last 168 hours.  Invalid input(s): CK ------------------------------------------------------------------------------------------------------------------    Component Value Date/Time   BNP 154.3 (H) 11/27/2014 0405    Inpatient Medications  Scheduled Meds: . apixaban  5 mg Oral BID  . atorvastatin  40 mg Oral Daily  . docusate sodium  100 mg Oral BID  . feeding supplement (ENSURE ENLIVE)  237 mL Oral BID BM  . insulin aspart  0-9 Units Subcutaneous TID WC  . isosorbide mononitrate  30 mg Oral q morning - 10a  . metoprolol tartrate  12.5 mg Oral BID  . omega-3 acid ethyl esters  1 g Oral Daily   Continuous Infusions: . lactated ringers 75 mL/hr at 10/01/16 1047   PRN Meds:.acetaminophen **OR** acetaminophen, ALPRAZolam, HYDROcodone-acetaminophen, methocarbamol **OR** methocarbamol (ROBAXIN)  IV, metoCLOPramide **OR** metoCLOPramide (REGLAN) injection, morphine injection, nitroGLYCERIN, ondansetron **OR** ondansetron (ZOFRAN) IV, polyvinyl alcohol  Micro Results No results found for this or any previous visit (from the past 240 hour(s)).  Radiology Reports Dg Chest 1 View  Result Date: 09/30/2016 CLINICAL DATA:  Unwitnessed fall. EXAM: CHEST 1 VIEW COMPARISON:  Radiographs of August 02, 2016. FINDINGS: Stable cardiomediastinal silhouette. Atherosclerosis of thoracic aorta is noted. No pneumothorax or pleural effusion is noted. Mild left lateral basilar opacity is noted concerning for scarring or atelectasis. Focal atelectasis or scarring is noted in right  suprahilar region. Bony thorax is unremarkable. IMPRESSION: Aortic atherosclerosis. Probable scarring or atelectasis seen in left basilar and right suprahilar regions. Electronically Signed   By: Marijo Conception, M.D.   On: 09/30/2016 12:25   Dg Knee 1-2 Views Left  Result Date: 09/30/2016 CLINICAL DATA:  Fall with left hip fracture. EXAM: LEFT KNEE - 1-2 VIEW COMPARISON:  None. FINDINGS: Negative for fracture or joint effusion. Normal alignment as permitted by angle of imaging. No notable degenerative changes. Small, incidental bony excrescence from the medial tibial metaphysis. IMPRESSION: Negative. Electronically Signed   By: Monte Fantasia M.D.   On: 09/30/2016 12:21   Dg Tibia/fibula Left  Result Date: 09/30/2016 CLINICAL DATA:  Fall with left leg foreshortening. Shin pain. Initial encounter. EXAM: LEFT TIBIA AND FIBULA - 2 VIEW COMPARISON:  None. FINDINGS: No acute fracture or dislocation.  Osteopenia and atherosclerosis. IMPRESSION: No acute finding. Electronically Signed   By: Neva Seat.D.  On: 09/30/2016 12:22   Dg Humerus Left  Result Date: 09/30/2016 CLINICAL DATA:  Left arm pain after fall. EXAM: LEFT HUMERUS - 2+ VIEW COMPARISON:  None. FINDINGS: There is no evidence of fracture or other focal bone lesions. Soft tissues are unremarkable. IMPRESSION: Normal left humerus. Electronically Signed   By: Marijo Conception, M.D.   On: 09/30/2016 12:27   Dg C-arm 61-120 Min-no Report  Result Date: 09/30/2016 Fluoroscopy was utilized by the requesting physician.  No radiographic interpretation.   Dg Hip Operative Unilat W Or W/o Pelvis Left  Result Date: 09/30/2016 CLINICAL DATA:  Internal fixation of left femoral fracture. Initial encounter. EXAM: OPERATIVE LEFT HIP WITH PELVIS COMPARISON:  Left hip radiographs performed earlier today at 12:07 p.m. FINDINGS: Four fluoroscopic C-arm images are provided from the OR. These demonstrate placement of an intramedullary nail and screw through  the left femur, transfixing the left femoral intertrochanteric fracture in grossly anatomic alignment. The left femoral head remains seated at the acetabulum. No new fractures are seen. IMPRESSION: Status post internal fixation of left femoral intertrochanteric fracture in grossly anatomic alignment. Electronically Signed   By: Garald Balding M.D.   On: 09/30/2016 23:56   Dg Hip Unilat With Pelvis 2-3 Views Left  Result Date: 09/30/2016 CLINICAL DATA:  Fall with left hip pain.  Initial encounter. EXAM: DG HIP (WITH OR WITHOUT PELVIS) 2-3V LEFT COMPARISON:  None. FINDINGS: Acute intertrochanteric left femur fracture with varus angulation. Both hips are located. No evidence of pelvic ring fracture. Osteopenia and atherosclerosis. IMPRESSION: Acute intertrochanteric left femur fracture with varus angulation. Electronically Signed   By: Monte Fantasia M.D.   On: 09/30/2016 12:20     Jayon Matton M.D on 10/03/2016 at 1:38 PM  Between 7am to 7pm - Pager - [phone]  After 7pm go to [email or web address] - password [facility]  [facility] -  Office  [phone]

## 2016-10-03 NOTE — Progress Notes (Signed)
ANTICOAGULATION CONSULT NOTE - Initial Consult  Pharmacy Consult for apixaban Indication: atrial fibrillation  No Known Allergies  Patient Measurements: Height: 5\' 3"  (160 cm) Weight: 169 lb (76.7 kg) IBW/kg (Calculated) : 52.4 Heparin Dosing Weight:   Vital Signs: Temp: 98.1 F (36.7 C) (04/15 0525) Temp Source: Oral (04/15 0525) BP: 137/65 (04/15 0525) Pulse Rate: 73 (04/15 0525)  Labs:  Recent Labs  09/30/16 1233  10/01/16 0755 10/02/16 0516 10/03/16 0433  HGB 11.6*  --  8.4* 7.8* 9.5*  HCT 34.7*  --  25.0* 23.7* 28.4*  PLT 166  --  124* 144* 170  LABPROT 14.4  --   --   --   --   INR 1.11  --   --   --   --   CREATININE 1.37*  < > 1.15* 1.32* 1.07*  CKTOTAL 79  --   --   --   --   < > = values in this interval not displayed.  Estimated Creatinine Clearance: 37 mL/min (A) (by C-G formula based on SCr of 1.07 mg/dL (H)).   Medical History: Past Medical History:  Diagnosis Date  . Anxiety   . Coronary artery disease   . Diabetes mellitus without complication ([place])   . Hypertension   . Pneumonia 2015   Assessment: 66 YOF s/p  IM nailing 4/12 d/t hip fracture.  She has history of afib (and CAD with stent 2y ago) and has been on clopidogrel/ASA.  Pharmacy asked to dose apixaban for afib.   Today, 10/03/2016  ASA and plavix stopped  Renal: SCr = 1.07  CBC: Hgb improved s/p PRBC, pltc improved to WNL  Goal of Therapy:  Dose appropriately for patient specific parameters   Plan:   Only meets 1 of 3 criteria for dose reduction, so start standard dose of apixaban 5mg  PO BID  Monitor for bleeding   Monitor renal function  Doreene Eland, PharmD, BCPS.   Pager: [phone] 10/03/2016 8:43 AM

## 2016-10-03 NOTE — Discharge Instructions (Signed)
-  DVT ppx per medicine team and cards -ok for full WBAT to left leg -maintain dressing until follow up appointment, may shower with bandage in place -return to clinic in 2 weeks for wound check  Information on my medicine - ELIQUIS (apixaban)  This medication education was reviewed with me or my healthcare representative as part of my discharge preparation.  The pharmacist that spoke with me during my hospital stay was:  Henreitta Leber., PharmD  Why was Eliquis prescribed for you? Eliquis was prescribed for you to reduce the risk of a blood clot forming that can cause a stroke if you have a medical condition called atrial fibrillation (a type of irregular heartbeat).  What do You need to know about Eliquis ? Take your Eliquis TWICE DAILY - one tablet in the morning and one tablet in the evening with or without food. If you have difficulty swallowing the tablet whole please discuss with your pharmacist how to take the medication safely.  Take Eliquis exactly as prescribed by your doctor and DO NOT stop taking Eliquis without talking to the doctor who prescribed the medication.  Stopping may increase your risk of developing a stroke.  Refill your prescription before you run out.  After discharge, you should have regular check-up appointments with your healthcare provider that is prescribing your Eliquis.  In the future your dose may need to be changed if your kidney function or weight changes by a significant amount or as you get older.  What do you do if you miss a dose? If you miss a dose, take it as soon as you remember on the same day and resume taking twice daily.  Do not take more than one dose of ELIQUIS at the same time to make up a missed dose.  Important Safety Information A possible side effect of Eliquis is bleeding. You should call your healthcare provider right away if you experience any of the following: ? Bleeding from an injury or your nose that does not stop. ? Unusual  colored urine (red or dark [place]) or unusual colored stools (red or black). ? Unusual bruising for unknown reasons. ? A serious fall or if you hit your head (even if there is no bleeding).  Some medicines may interact with Eliquis and might increase your risk of bleeding or clotting while on Eliquis. To help avoid this, consult your healthcare provider or pharmacist prior to using any new prescription or non-prescription medications, including herbals, vitamins, non-steroidal anti-inflammatory drugs (NSAIDs) and supplements.  This website has more information on Eliquis (apixaban): http://www.eliquis.com/eliquis/home

## 2016-10-04 LAB — CBC
HEMATOCRIT: 28.7 % — AB (ref 36.0–46.0)
HEMOGLOBIN: 9.2 g/dL — AB (ref 12.0–15.0)
MCH: 29 pg (ref 26.0–34.0)
MCHC: 32.1 g/dL (ref 30.0–36.0)
MCV: 90.5 fL (ref 78.0–100.0)
Platelets: 203 10*3/uL (ref 150–400)
RBC: 3.17 MIL/uL — ABNORMAL LOW (ref 3.87–5.11)
RDW: 17 % — ABNORMAL HIGH (ref 11.5–15.5)
WBC: 6.8 10*3/uL (ref 4.0–10.5)

## 2016-10-04 LAB — BPAM RBC
BLOOD PRODUCT EXPIRATION DATE: 201805022359
ISSUE DATE / TIME: 201804141135
UNIT TYPE AND RH: 6200

## 2016-10-04 LAB — TYPE AND SCREEN
ABO/RH(D): A POS
ANTIBODY SCREEN: NEGATIVE
UNIT DIVISION: 0

## 2016-10-04 LAB — GLUCOSE, CAPILLARY
GLUCOSE-CAPILLARY: 122 mg/dL — AB (ref 65–99)
GLUCOSE-CAPILLARY: 160 mg/dL — AB (ref 65–99)
Glucose-Capillary: 147 mg/dL — ABNORMAL HIGH (ref 65–99)

## 2016-10-04 MED ORDER — METHOCARBAMOL 500 MG PO TABS: 500.0000 mg | ORAL_TABLET | Freq: Three times a day (TID) | ORAL | Status: AC | PRN

## 2016-10-04 MED ORDER — ISOSORBIDE MONONITRATE ER 30 MG PO TB24: 30.0000 mg | ORAL_TABLET | Freq: Every morning | ORAL | 3 refills | Status: AC

## 2016-10-04 MED ORDER — FUROSEMIDE 10 MG/ML IJ SOLN
40.0000 mg | Freq: Once | INTRAMUSCULAR | Status: AC
  Administered 2016-10-04: 40 mg via INTRAVENOUS
  Filled 2016-10-04: qty 4

## 2016-10-04 MED ORDER — FUROSEMIDE 40 MG PO TABS: 80.0000 mg | ORAL_TABLET | Freq: Every day | ORAL | Status: AC

## 2016-10-04 MED ORDER — FUROSEMIDE 10 MG/ML IJ SOLN: 60.0000 mg | Freq: Once | INTRAMUSCULAR | Status: DC

## 2016-10-04 MED ORDER — ALPRAZOLAM 0.25 MG PO TABS: 0.2500 mg | ORAL_TABLET | Freq: Four times a day (QID) | ORAL | 0 refills | Status: AC | PRN

## 2016-10-04 MED ORDER — DOCUSATE SODIUM 100 MG PO CAPS
100.0000 mg | ORAL_CAPSULE | Freq: Two times a day (BID) | ORAL | 0 refills | Status: AC
Start: 2016-10-04 — End: ?

## 2016-10-04 MED ORDER — HYDROCODONE-ACETAMINOPHEN 5-325 MG PO TABS: 1.0000 | ORAL_TABLET | Freq: Four times a day (QID) | ORAL | 0 refills | Status: AC | PRN

## 2016-10-04 MED ORDER — ENSURE ENLIVE PO LIQD: 237.0000 mL | Freq: Two times a day (BID) | ORAL | 12 refills | Status: AC

## 2016-10-04 MED ORDER — APIXABAN 5 MG PO TABS: 5.0000 mg | ORAL_TABLET | Freq: Two times a day (BID) | ORAL | Status: AC

## 2016-10-04 MED ORDER — FERROUS SULFATE 325 (65 FE) MG PO TABS: 325.0000 mg | ORAL_TABLET | Freq: Every day | ORAL | 3 refills | Status: AC

## 2016-10-04 NOTE — Clinical Social Work Placement (Signed)
   CLINICAL SOCIAL WORK PLACEMENT  NOTE  Date:  10/04/2016  Patient Details  Name: Diane Fry MRN: [id number] Date of Birth: 1929-05-14  Clinical Social Work is seeking post-discharge placement for this patient at the [place] level of care (*CSW will initial, date and re-position this form in  chart as items are completed):  Yes   Patient/family provided with [facility] Work Department's list of facilities offering this level of care within the geographic area requested by the patient (or if unable, by the patient's family).  Yes   Patient/family informed of their freedom to choose among providers that offer the needed level of care, that participate in Medicare, Medicaid or managed care program needed by the patient, have an available bed and are willing to accept the patient.  Yes   Patient/family informed of [facility]'s ownership interest in [facility] and [facility], as well as of the fact that they are under no obligation to receive care at these facilities.  PASRR submitted to EDS on       PASRR number received on       Existing PASRR number confirmed on 10/04/16     FL2 transmitted to all facilities in geographic area requested by pt/family on       FL2 transmitted to all facilities within larger geographic area on       Patient informed that his/her managed care company has contracts with or will negotiate with certain facilities, including the following:            Patient/family informed of bed offers received.  Patient chooses bed at [facility]     Physician recommends and patient chooses bed at [facility]    Patient to be transferred to [facility] on 10/04/16.  Patient to be transferred to facility by [facility]     Patient family notified on 10/04/16 of transfer.  Name of family member notified:  Daughter     PHYSICIAN Please prepare priority discharge summary,  including medications     Additional Comment:    _______________________________________________ Lia Hopping, LCSW 10/04/2016, 9:28 AM

## 2016-10-04 NOTE — Progress Notes (Signed)
Physical Therapy Treatment Patient Details Name: Diane Fry MRN: [id number] DOB: [birth year] Today's Date: 10/04/2016    History of Present Illness pt sustained a L hip fx and is s/p IM nail.  PMH:  CAD, stent, anxiety, PAF, DM    PT Comments    POD # 4 L IM Nail s/p mechanical fall in her home([place]).  Lives home alone and stated she layed there all nigh as she was unable to reach the phone. Assisted OOB to [facility] to void.  Noted increased dyspnea.  RA range from 86-87%.  Assisted with hygiene then attempted amb however pt c/o increased dizziness and noted unsteady stance.  Standing BP taken + 2 assist was 125/86 and HR 88.  Pt c/o feeling "hot" so ceased gait and brought recliner to pt.  Asked NT to get a glucose was 160.  Pt feeling better in recliner, positioned to vomfort and applied ICE.  Reapplied 2 lts nasal per RN request sats 94%. Pt lives home alone and will need ST Rehab at SNF post acute hospital stay.  Follow Up Recommendations  SNF     Equipment Recommendations       Recommendations for Other Services       Precautions / Restrictions Precautions Precautions: Fall Restrictions Weight Bearing Restrictions: No Other Position/Activity Restrictions: WBAT    Mobility  Bed Mobility Overal bed mobility: Needs Assistance Bed Mobility: Supine to Sit     Supine to sit: Mod assist;Max assist     General bed mobility comments: Assist with L LE and utilized bed pad to complete scooting to EOB.  Once upright, pt is able to static sit EOB at Supervision.  Mild c/o dizziness and noted increased dyspnea.  Transfers Overall transfer level: Needs assistance Equipment used: Rolling walker (2 wheeled);None Transfers: Sit to/from [facility] Sit to Stand: Min assist;Mod assist;From elevated surface Stand pivot transfers: Mod assist;Min assist       General transfer comment: assisted from elevated bed to [facility] then off BSC with 50% VC's on proper hand placement and  and 75% VC's/tactile cueing for turn completion/target to sit.    Ambulation/Gait Ambulation/Gait assistance: Min assist Ambulation Distance (Feet): 4 Feet Assistive device: Rolling walker (2 wheeled) Gait Pattern/deviations: Step-to pattern;Decreased step length - right;Decreased step length - left;Shuffle;Trunk flexed Gait velocity: decreased   General Gait Details: decreased amb distance due to increased c/o "dizziness" and increased dyspnea/RR.  Standing BP was 125/86, HR 88 and RA decreased to 86%.  Recliner brought to pt from behind.    Stairs            Wheelchair Mobility    Modified Rankin (Stroke Patients Only)       Balance                                            Cognition Arousal/Alertness: Awake/alert Behavior During Therapy: WFL for tasks assessed/performed Overall Cognitive Status: Within Functional Limits for tasks assessed                                        Exercises      General Comments        Pertinent Vitals/Pain Pain Assessment: 0-10 Pain Score: 6  Pain Location: L hip Pain Descriptors / Indicators: Discomfort;Grimacing;Sore Pain  Intervention(s): Monitored during session;Repositioned;Ice applied    Home Living                      Prior Function            PT Goals (current goals can now be found in the care plan section) Progress towards PT goals: Progressing toward goals    Frequency           PT Plan Current plan remains appropriate    Co-evaluation             End of Session Equipment Utilized During Treatment: Gait belt Activity Tolerance: Other (comment) (dizziness and dyspnea) Patient left: in chair;with call bell/phone within reach Nurse Communication: Other (comment) (vitals) PT Visit Diagnosis: Unsteadiness on feet (R26.81);History of falling (Z91.81);Muscle weakness (generalized) (M62.81);Difficulty in walking, not elsewhere classified (R26.2);Pain      Time: 7482-7078 PT Time Calculation (min) (ACUTE ONLY): 41 min  Charges:  $Gait Training: 8-22 mins $Therapeutic Activity: 23-37 mins                    G Codes:       Rica Koyanagi  PTA [facility]  Acute  Rehab Pager      [phone]

## 2016-10-04 NOTE — Discharge Summary (Signed)
Diane Fry, is a 83 y.o. female  DOB Mar 22, 1929  MRN [id number].  Admission date:  09/30/2016  Admitting Physician  Nicholes Stairs, MD  Discharge Date:  10/04/2016   Primary MD  Myriam Jacobson, MD  Recommendations for primary care physician for things to follow:  - please check CBC, BMP in 3 days, to ensure stable hemoglobin as patient started on Eliquis, and stable renal function as she is back on Lasix. - Follow with orthopedic Dr Stann Mainland within 10 days, SNF  to arrange for appointment and transfer   Admission Diagnosis  Closed left hip fracture, initial encounter [facility]) [S72.002A] S/p left hip fracture [Z87.81]   Discharge Diagnosis  Closed left hip fracture, initial encounter ([place]) [S72.002A] S/p left hip fracture [Z87.81]    Active Problems:   Coronary atherosclerosis of native coronary artery   Type 2 diabetes mellitus ([place])   Paroxysmal atrial fibrillation ([place])   Closed left hip fracture, initial encounter [facility])      Past Medical History:  Diagnosis Date  . Anxiety   . Coronary artery disease   . Diabetes mellitus without complication ([place])   . Hypertension   . Pneumonia 2015    Past Surgical History:  Procedure Laterality Date  . CARDIAC CATHETERIZATION N/A 10/31/2014   Procedure: Left Heart Cath and Coronary Angiography;  Surgeon: Charolette Forward, MD;  Location: [place] CV LAB;  Service: Cardiovascular;  Laterality: N/A;  . CATARACT EXTRACTION    . CORONARY ANGIOPLASTY WITH STENT PLACEMENT  10/31/2014   LAD   . 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 FEMUR IM NAIL Left 09/30/2016   Procedure: INTRAMEDULLARY (IM) NAIL FEMORAL;  Surgeon: Nicholes Stairs, MD;  Location: [facility];  Service: Orthopedics;  Laterality: Left;  . PERCUTANEOUS CORONARY STENT INTERVENTION (PCI-S)  10/31/2014   Procedure: Percutaneous Coronary Stent Intervention (Pci-S);  Surgeon: Charolette Forward, MD;  Location: [place] [facility];  Service: Cardiovascular;;  . TRANSNASAL APPROACH N/A 09/07/2013   Procedure: TRANSNASAL APPROACH;  Surgeon: Izora Gala, MD;  Location: [place] [facility];  Service: ENT;  Laterality: N/A;       History of present illness and  Hospital Course:     Kindly see H&P for history of present illness and admission details, please review complete Labs, Consult reports and Test reports for all details in brief  HPI  from the history and physical done on the day of admission 09/30/2016 HPI: Diane Fry is a 83 y.o. female with medical history significant of CAD, S/P Stent 2016, anxiety, DM, PAF,  who presents after a mechanical fall. She stands up to go to bathroom  when she slipped  and fell on her left side. She was not able to stands and she was on the floor for many hours, until she was able to call for  help. She denies chest pain or dyspnea prior falling. She denies chest pain or dyspnea on ambulation. She denies chest pain at rest. She is having pain left hip.    ED Course: hb 11, Cr 1.3, BUN 36, k at 3.1,  Hip x ray: Acute intertrochanteric left femur fracture with varus angulation. Chest x ray: Aortic atherosclerosis. Probable scarring or atelectasis seen in left basilar and right suprahilar regions.   Hospital Course   83 y.o.femalewith medical history significant of CAD, S/P Stent 2016, anxiety, DM, PAF, who presents With mechanical fall and left hip fracture, status post surgical repair 4/12 by Dr.Rogers,   Left Hip fracture - Secondary to mechanical fall, status post surgical repair by Dr. Stann Mainland 4/13, seen by PT, recommendation is for SNF, to follow as an outpatient.  Postoperative anemia - Hemoglobin as low as 7.8, required 1  unit PRBC transfusion, today is 9.2 on discharge, will start on iron supplements, monitor hemoglobin closely. - Discussed with orthopedic PA Dara Lords, there is no contraindication at this time to start full anticoagulation with Eliquis for A. Fib  CAD - She denies any chest pain or shortness of breath, continue statin, she is on aspirin and Plavix at home , her stent was placed in May 2016, discussed with Dr. Terrence Dupont, aspirin and Plavix can be stopped in favor of initiating s the setting of known history of A. fib and need for DVT prophylaxis, started on Eliquis.  HTN - Resume home medication on discharge .  DM -   on insulin sliding scale during hospital stay, resume metformin on discharge.  Chronic Diastolic heart Failure. - On lasix 80 mg in am and 40 mg at baseline, will discharge on 80 mg oral daily.  PAF - on amiodarone 100 mg Twice a week, which was stopped given prolonged QTC as discussed with Dr. Terrence Dupont. - started on Eliquis.  Hypothyroidism - Continue with synthroid.   Prolonged QTc - QTc is 592 on admission, stopped amiodarone as D/W Dr Terrence Dupont.  Hypomagnesemia  - Repleted,   CAD - denies any chest pain or SOB, aspirin and Plavix is being stopped in favor of starting Eliquis as D/W with Dr Terrence Dupont.    Discharge Condition:  Stable    Follow UP   Contact information for follow-up providers    Nicholes Stairs, MD. Schedule an appointment as soon as possible for a visit in 2 week(s).   Specialty:  Orthopedic Surgery Contact information: [address] [place] [address] [phone]            Contact information for after-discharge care    Destination    [facility] Follow up.   Specialty:  [place] information: [place] [phone]                    Discharge Instructions  and  Discharge Medications    Discharge  Instructions    Discharge instructions    Complete by:  As directed    Follow with Primary MD ROBERTS, Sharol Given, MD after dischrge from SNF    Get CBC, CMP,  checked  by Primary MD next visit.    Activity: As tolerated with Full fall precautions use walker/cane & assistance as needed   Disposition SNF   Diet: Heart Healthy , Carb modified diet , with feeding assistance and aspiration precautions.  For Heart failure patients - Check your Weight same time everyday, if  you gain over 2 pounds, or you develop in leg swelling, experience more shortness of breath or chest pain, call your Primary MD immediately. Follow Cardiac Low Salt Diet and 1.5 lit/day fluid restriction.   On your next visit with your primary care physician please Get Medicines reviewed and adjusted.   Please request your Prim.MD to go over all Hospital Tests and Procedure/Radiological results at the follow up, please get all Hospital records sent to your Prim MD by signing hospital release before you go home.   If you experience worsening of your admission symptoms, develop shortness of breath, life threatening emergency, suicidal or homicidal thoughts you must seek medical attention immediately by calling 911 or calling your MD immediately  if symptoms less severe.  You Must read complete instructions/literature along with all the possible adverse reactions/side effects for all the Medicines you take and that have been prescribed to you. Take any new Medicines after you have completely understood and accpet all the possible adverse reactions/side effects.   Do not drive, operating heavy machinery, perform activities at heights, swimming or participation in water activities or provide baby sitting services if your were admitted for syncope or siezures until you have seen by Primary MD or a Neurologist and advised to do so again.  Do not drive when taking Pain medications.    Do not take more than prescribed Pain,  Sleep and Anxiety Medications  Special Instructions: If you have smoked or chewed Tobacco  in the last 2 yrs please stop smoking, stop any regular Alcohol  and or any Recreational drug use.  Wear Seat belts while driving.   Please note  You were cared for by a hospitalist during your hospital stay. If you have any questions about your discharge medications or the care you received while you were in the hospital after you are discharged, you can call the unit and asked to speak with the hospitalist on call if the hospitalist that took care of you is not available. Once you are discharged, your primary care physician will handle any further medical issues. Please note that NO REFILLS for any discharge medications will be authorized once you are discharged, as it is imperative that you return to your primary care physician (or establish a relationship with a primary care physician if you do not have one) for your aftercare needs so that they can reassess your need for medications and monitor your lab values.     Allergies as of 10/04/2016   No Known Allergies     Medication List    STOP taking these medications   aspirin EC 81 MG tablet   clopidogrel 75 MG tablet Commonly known as:  PLAVIX     TAKE these medications   ALPRAZolam 0.25 MG tablet Commonly known as:  XANAX Take 1 tablet (0.25 mg total) by mouth 4 (four) times daily as needed for anxiety.   apixaban 5 MG Tabs tablet Commonly known as:  ELIQUIS Take 1 tablet (5 mg total) by mouth 2 (two) times daily.   atorvastatin 80 MG tablet Commonly known as:  LIPITOR Take 40 mg by mouth daily.   docusate sodium 100 MG capsule Commonly known as:  COLACE Take 1 capsule (100 mg total) by mouth 2 (two) times daily.   feeding supplement (ENSURE ENLIVE) Liqd Take 237 mLs by mouth 2 (two) times daily between meals.   ferrous sulfate 325 (65 FE) MG tablet Take 1 tablet (325 mg total) by mouth daily with breakfast.  FISH OIL  PO Take 1 capsule by mouth daily.   furosemide 40 MG tablet Commonly known as:  LASIX Take 2 tablets (80 mg total) by mouth daily. Pt takes two tablets in the morning and one at bedtime. What changed:  how much to take  when to take this   HYDROcodone-acetaminophen 5-325 MG tablet Commonly known as:  NORCO Take 1-2 tablets by mouth every 6 (six) hours as needed for moderate pain. What changed:  how much to take  when to take this   isosorbide mononitrate 30 MG 24 hr tablet Commonly known as:  IMDUR Take 1 tablet (30 mg total) by mouth every morning.   levothyroxine 50 MCG tablet Commonly known as:  SYNTHROID, LEVOTHROID Take 50 mcg by mouth daily before breakfast.   losartan 50 MG tablet Commonly known as:  COZAAR Take 50 mg by mouth daily.   metFORMIN 500 MG tablet Commonly known as:  GLUCOPHAGE Take 1 tablet (500 mg total) by mouth daily.   methocarbamol 500 MG tablet Commonly known as:  ROBAXIN Take 1 tablet (500 mg total) by mouth every 8 (eight) hours as needed for muscle spasms.   metoprolol tartrate 25 MG tablet Commonly known as:  LOPRESSOR Take 0.5 tablets (12.5 mg total) by mouth 2 (two) times daily.   nitroGLYCERIN 0.4 MG SL tablet Commonly known as:  NITROSTAT Place 1 tablet (0.4 mg total) under the tongue every 5 (five) minutes as needed for chest pain.   Polyethyl Glycol-Propyl Glycol 0.4-0.3 % Soln Place 1-2 drops into both eyes every 2 (two) hours as needed (for dry eyes).   potassium chloride SA 20 MEQ tablet Commonly known as:  K-DUR,KLOR-CON Take 20 mEq by mouth daily.   triamcinolone cream 0.1 % Commonly known as:  KENALOG Apply 1 application topically 3 (three) times daily as needed (for itching/irritation).         Diet and Activity recommendation: See Discharge Instructions above   Consults obtained -  Orthopedic   Major procedures and Radiology Reports - PLEASE review detailed and final reports for all details, in brief -      Dg Chest 1 View  Result Date: 09/30/2016 CLINICAL DATA:  Unwitnessed fall. EXAM: CHEST 1 VIEW COMPARISON:  Radiographs of August 02, 2016. FINDINGS: Stable cardiomediastinal silhouette. Atherosclerosis of thoracic aorta is noted. No pneumothorax or pleural effusion is noted. Mild left lateral basilar opacity is noted concerning for scarring or atelectasis. Focal atelectasis or scarring is noted in right suprahilar region. Bony thorax is unremarkable. IMPRESSION: Aortic atherosclerosis. Probable scarring or atelectasis seen in left basilar and right suprahilar regions. Electronically Signed   By: Marijo Conception, M.D.   On: 09/30/2016 12:25   Dg Knee 1-2 Views Left  Result Date: 09/30/2016 CLINICAL DATA:  Fall with left hip fracture. EXAM: LEFT KNEE - 1-2 VIEW COMPARISON:  None. FINDINGS: Negative for fracture or joint effusion. Normal alignment as permitted by angle of imaging. No notable degenerative changes. Small, incidental bony excrescence from the medial tibial metaphysis. IMPRESSION: Negative. Electronically Signed   By: Monte Fantasia M.D.   On: 09/30/2016 12:21   Dg Tibia/fibula Left  Result Date: 09/30/2016 CLINICAL DATA:  Fall with left leg foreshortening. Shin pain. Initial encounter. EXAM: LEFT TIBIA AND FIBULA - 2 VIEW COMPARISON:  None. FINDINGS: No acute fracture or dislocation.  Osteopenia and atherosclerosis. IMPRESSION: No acute finding. Electronically Signed   By: Monte Fantasia M.D.   On: 09/30/2016 12:22   Dg Humerus Left  Result Date: 09/30/2016 CLINICAL DATA:  Left arm pain after fall. EXAM: LEFT HUMERUS - 2+ VIEW COMPARISON:  None. FINDINGS: There is no evidence of fracture or other focal bone lesions. Soft tissues are unremarkable. IMPRESSION: Normal left humerus. Electronically Signed   By: Marijo Conception, M.D.   On: 09/30/2016 12:27   Dg C-arm 61-120 Min-no Report  Result Date: 09/30/2016 Fluoroscopy was utilized by the requesting physician.  No radiographic  interpretation.   Dg Hip Operative Unilat W Or W/o Pelvis Left  Result Date: 09/30/2016 CLINICAL DATA:  Internal fixation of left femoral fracture. Initial encounter. EXAM: OPERATIVE LEFT HIP WITH PELVIS COMPARISON:  Left hip radiographs performed earlier today at 12:07 p.m. FINDINGS: Four fluoroscopic C-arm images are provided from the OR. These demonstrate placement of an intramedullary nail and screw through the left femur, transfixing the left femoral intertrochanteric fracture in grossly anatomic alignment. The left femoral head remains seated at the acetabulum. No new fractures are seen. IMPRESSION: Status post internal fixation of left femoral intertrochanteric fracture in grossly anatomic alignment. Electronically Signed   By: Garald Balding M.D.   On: 09/30/2016 23:56   Dg Hip Unilat With Pelvis 2-3 Views Left  Result Date: 09/30/2016 CLINICAL DATA:  Fall with left hip pain.  Initial encounter. EXAM: DG HIP (WITH OR WITHOUT PELVIS) 2-3V LEFT COMPARISON:  None. FINDINGS: Acute intertrochanteric left femur fracture with varus angulation. Both hips are located. No evidence of pelvic ring fracture. Osteopenia and atherosclerosis. IMPRESSION: Acute intertrochanteric left femur fracture with varus angulation. Electronically Signed   By: Monte Fantasia M.D.   On: 09/30/2016 12:20    Micro Results    No results found for this or any previous visit (from the past 240 hour(s)).     Today   Subjective:   Diane Fry today has no headache,no chest or abdominal pain, reports generalized weakness.  Objective:   Blood pressure (!) 145/68, pulse 84, temperature 98.1 F (36.7 C), temperature source Oral, resp. rate 18, height 5\' 3"  (1.6 m), weight 76.7 kg (169 lb), SpO2 98 %.   Intake/Output Summary (Last 24 hours) at 10/04/16 1148 Last data filed at 10/04/16 1000  Gross per 24 hour  Intake              840 ml  Output              600 ml  Net              240 ml    Exam  Awake  Alert, Oriented X 3,  Supple Neck,No JVD, N Symmetrical Chest wall movement, Good air movement bilaterally, CTAB RRR,No Gallops,Rubs or new Murmurs, No Parasternal Heave +ve B.Sounds, Abd Soft, No tenderness,No rebound - guarding or rigidity. No Cyanosis, Clubbing or edema, No new Rash or bruise   Data Review   CBC w Diff:  Lab Results  Component Value Date   WBC 6.8 10/04/2016   HGB 9.2 (L) 10/04/2016   HGB 15.8 09/07/2007   HCT 28.7 (L) 10/04/2016   HCT 45.2 09/07/2007   PLT 203 10/04/2016   PLT 183 09/07/2007   LYMPHOPCT 9 09/30/2016   LYMPHOPCT 33.8 09/07/2007   MONOPCT 6 09/30/2016   MONOPCT 6.6 09/07/2007   EOSPCT 0 09/30/2016   EOSPCT 2.4 09/07/2007   BASOPCT 0 09/30/2016   BASOPCT 0.3 09/07/2007    CMP:  Lab Results  Component Value Date   NA 138 10/03/2016   K 4.1 10/03/2016   CL  100 (L) 10/03/2016   CO2 30 10/03/2016   BUN 27 (H) 10/03/2016   CREATININE 1.07 (H) 10/03/2016   PROT 7.7 11/24/2014   ALBUMIN 3.7 11/24/2014   BILITOT 1.1 11/24/2014   ALKPHOS 75 11/24/2014   AST 23 11/24/2014   ALT 20 11/24/2014  .   Total Time in preparing paper work, data evaluation and todays exam - 35 minutes  Izamar Linden M.D on 10/04/2016 at 11:48 AM  [facility]   Office  [phone]

## 2016-10-04 NOTE — Progress Notes (Signed)
PTAR scheduled for 1:30pm. Nurse and Daughter informed. Patient transport packet on chart.   Kathrin Greathouse, Latanya Presser, MSW Clinical Social Worker 5E and Psychiatric Service Line [phone] 10/04/2016  12:07 PM

## 2016-10-04 NOTE — Progress Notes (Signed)
Occupational Therapy Treatment Patient Details Name: MIRI JOSE MRN: [id number] DOB: 25-Dec-1928 Today's Date: 10/04/2016    History of present illness pt sustained a L hip fx and is s/p IM nail.  PMH:  CAD, stent, anxiety, PAF, DM   OT comments  Pt agreeable to BUE exercise.  Educated pt on benefits of BUE exercise in order for pt to increase I with transfers and ADL activity   Follow Up Recommendations  SNF    Equipment Recommendations  3 in 1 bedside commode    Recommendations for Other Services      Precautions / Restrictions Precautions Precautions: Fall Restrictions Weight Bearing Restrictions: No Other Position/Activity Restrictions: WBAT       Mobility Bed Mobility Overal bed mobility: Needs Assistance Bed Mobility: Supine to Sit     Supine to sit: Mod assist;Max assist     General bed mobility comments: pt sitting in chair  Transfers Overall transfer level: Needs assistance Equipment used: Rolling walker (2 wheeled);None Transfers: Sit to/from Stand Sit to Stand: Mod assist Stand pivot transfers: Mod assist;Min assist       General transfer comment: VC for hand placement.  Pt only able to perform 1 sit to stand at pt in pain and fatigued        ADL either performed or assessed with clinical judgement   ADL                                         General ADL Comments: pt fatigued from working with PT but did agree to BUE exercise- goal added.  Pt also states pain in 8/10 pain. RN notified               Cognition Arousal/Alertness: Awake/alert Behavior During Therapy: WFL for tasks assessed/performed Overall Cognitive Status: Within Functional Limits for tasks assessed                                          Exercises Shoulder Exercises Shoulder Flexion: AROM;20 reps;Seated;Both Shoulder ABduction: AROM;20 reps;Seated;Both Elbow Flexion: AROM;Both;Seated;20 reps Elbow Extension: AROM;Both;Seated;20  reps   Shoulder Instructions       General Comments      Pertinent Vitals/ Pain       Pain Assessment: 0-10 Pain Score: 8  Pain Location: L hip Pain Descriptors / Indicators: Discomfort;Grimacing;Sore Pain Intervention(s): Monitored during session;Repositioned;Patient requesting pain meds-RN notified;Limited activity within patient's tolerance         Frequency  Min 2X/week        Progress Toward Goals  OT Goals(current goals can now be found in the care plan section)  Progress towards OT goals: Progressing toward goals  ADL Goals Pt/caregiver will Perform Home Exercise Program: Increased strength;Both right and left upper extremity;With Supervision  Plan Discharge plan remains appropriate    Co-evaluation                 End of Session Equipment Utilized During Treatment: Gait belt;Rolling walker  OT Visit Diagnosis: Muscle weakness (generalized) (M62.81);Pain Pain - Right/Left: Left Pain - part of body: Hip   Activity Tolerance Patient limited by pain;Patient limited by fatigue   Patient Left in chair;with call bell/phone within reach;with chair alarm set   Nurse Communication Mobility status;Patient requests pain meds  Time: 1050-1105 OT Time Calculation (min): 15 min  Charges: OT General Charges $OT Visit: 1 Procedure OT Treatments $Therapeutic Exercise: 8-22 mins  [place], [place]   Betsy Pries 10/04/2016, 11:20 AM

## 2016-10-04 NOTE — Progress Notes (Signed)
Patient being transferred to [facility], attempt to call report x 2, no answer.

## 2016-10-04 NOTE — Care Management Important Message (Signed)
Important Message  Patient Details  Name: Diane Fry MRN: [id number] Date of Birth: 02-16-1929   Medicare Important Message Given:  Yes    Kerin Salen 10/04/2016, 12:46 [place] Message  Patient Details  Name: Diane Fry MRN: [id number] Date of Birth: [birth year]   Medicare Important Message Given:  Yes    Kerin Salen 10/04/2016, 12:46 PM

## 2016-11-26 NOTE — Addendum Note (Signed)
Addendum  created 11/26/16 1158 by [name], MD   Sign clinical note

## 2019-01-17 ENCOUNTER — Inpatient Hospital Stay (HOSPITAL_COMMUNITY): Payer: Medicare Other

## 2019-01-17 ENCOUNTER — Emergency Department (HOSPITAL_COMMUNITY): Payer: Medicare Other

## 2019-01-17 ENCOUNTER — Inpatient Hospital Stay (HOSPITAL_COMMUNITY)
Admission: EM | Admit: 2019-01-17 | Discharge: 2019-01-20 | DRG: 064 | Disposition: E | Payer: Medicare Other | Attending: Neurology | Admitting: Neurology

## 2019-01-17 DIAGNOSIS — Z79899 Other long term (current) drug therapy: Secondary | ICD-10-CM | POA: Diagnosis not present

## 2019-01-17 DIAGNOSIS — Z66 Do not resuscitate: Secondary | ICD-10-CM | POA: Diagnosis present

## 2019-01-17 DIAGNOSIS — J9600 Acute respiratory failure, unspecified whether with hypoxia or hypercapnia: Secondary | ICD-10-CM | POA: Diagnosis present

## 2019-01-17 DIAGNOSIS — E119 Type 2 diabetes mellitus without complications: Secondary | ICD-10-CM | POA: Diagnosis present

## 2019-01-17 DIAGNOSIS — M4854XA Collapsed vertebra, not elsewhere classified, thoracic region, initial encounter for fracture: Secondary | ICD-10-CM | POA: Diagnosis present

## 2019-01-17 DIAGNOSIS — I615 Nontraumatic intracerebral hemorrhage, intraventricular: Principal | ICD-10-CM

## 2019-01-17 DIAGNOSIS — I610 Nontraumatic intracerebral hemorrhage in hemisphere, subcortical: Secondary | ICD-10-CM

## 2019-01-17 DIAGNOSIS — G936 Cerebral edema: Secondary | ICD-10-CM | POA: Diagnosis present

## 2019-01-17 DIAGNOSIS — E785 Hyperlipidemia, unspecified: Secondary | ICD-10-CM | POA: Diagnosis present

## 2019-01-17 DIAGNOSIS — Z955 Presence of coronary angioplasty implant and graft: Secondary | ICD-10-CM | POA: Diagnosis not present

## 2019-01-17 DIAGNOSIS — Z515 Encounter for palliative care: Secondary | ICD-10-CM | POA: Diagnosis not present

## 2019-01-17 DIAGNOSIS — I2510 Atherosclerotic heart disease of native coronary artery without angina pectoris: Secondary | ICD-10-CM | POA: Diagnosis present

## 2019-01-17 DIAGNOSIS — R451 Restlessness and agitation: Secondary | ICD-10-CM | POA: Diagnosis present

## 2019-01-17 DIAGNOSIS — I161 Hypertensive emergency: Secondary | ICD-10-CM | POA: Diagnosis present

## 2019-01-17 DIAGNOSIS — I10 Essential (primary) hypertension: Secondary | ICD-10-CM | POA: Diagnosis present

## 2019-01-17 DIAGNOSIS — R29731 NIHSS score 31: Secondary | ICD-10-CM | POA: Diagnosis present

## 2019-01-17 DIAGNOSIS — N390 Urinary tract infection, site not specified: Secondary | ICD-10-CM | POA: Diagnosis present

## 2019-01-17 DIAGNOSIS — G9389 Other specified disorders of brain: Secondary | ICD-10-CM | POA: Diagnosis present

## 2019-01-17 DIAGNOSIS — Z20828 Contact with and (suspected) exposure to other viral communicable diseases: Secondary | ICD-10-CM | POA: Diagnosis present

## 2019-01-17 DIAGNOSIS — Z7901 Long term (current) use of anticoagulants: Secondary | ICD-10-CM

## 2019-01-17 DIAGNOSIS — R4182 Altered mental status, unspecified: Secondary | ICD-10-CM | POA: Diagnosis present

## 2019-01-17 DIAGNOSIS — I4819 Other persistent atrial fibrillation: Secondary | ICD-10-CM | POA: Diagnosis present

## 2019-01-17 DIAGNOSIS — Z79890 Hormone replacement therapy (postmenopausal): Secondary | ICD-10-CM

## 2019-01-17 DIAGNOSIS — J9811 Atelectasis: Secondary | ICD-10-CM | POA: Diagnosis present

## 2019-01-17 DIAGNOSIS — B9620 Unspecified Escherichia coli [E. coli] as the cause of diseases classified elsewhere: Secondary | ICD-10-CM | POA: Diagnosis present

## 2019-01-17 DIAGNOSIS — F419 Anxiety disorder, unspecified: Secondary | ICD-10-CM | POA: Diagnosis present

## 2019-01-17 DIAGNOSIS — G911 Obstructive hydrocephalus: Secondary | ICD-10-CM | POA: Diagnosis present

## 2019-01-17 DIAGNOSIS — I619 Nontraumatic intracerebral hemorrhage, unspecified: Secondary | ICD-10-CM | POA: Diagnosis present

## 2019-01-17 DIAGNOSIS — I480 Paroxysmal atrial fibrillation: Secondary | ICD-10-CM | POA: Diagnosis not present

## 2019-01-17 DIAGNOSIS — Z79891 Long term (current) use of opiate analgesic: Secondary | ICD-10-CM

## 2019-01-17 DIAGNOSIS — Z7984 Long term (current) use of oral hypoglycemic drugs: Secondary | ICD-10-CM

## 2019-01-17 LAB — URINALYSIS, COMPLETE (UACMP) WITH MICROSCOPIC
Bilirubin Urine: NEGATIVE
Glucose, UA: NEGATIVE mg/dL
Hgb urine dipstick: NEGATIVE
Ketones, ur: NEGATIVE mg/dL
Nitrite: NEGATIVE
Protein, ur: NEGATIVE mg/dL
Specific Gravity, Urine: 1.013 (ref 1.005–1.030)
pH: 6 (ref 5.0–8.0)

## 2019-01-17 LAB — CBC WITH DIFFERENTIAL/PLATELET
Abs Immature Granulocytes: 0.04 10*3/uL (ref 0.00–0.07)
Basophils Absolute: 0 10*3/uL (ref 0.0–0.1)
Basophils Relative: 0 %
Eosinophils Absolute: 0 10*3/uL (ref 0.0–0.5)
Eosinophils Relative: 0 %
HCT: 46.3 % — ABNORMAL HIGH (ref 36.0–46.0)
Hemoglobin: 15.1 g/dL — ABNORMAL HIGH (ref 12.0–15.0)
Immature Granulocytes: 0 %
Lymphocytes Relative: 11 %
Lymphs Abs: 1.1 10*3/uL (ref 0.7–4.0)
MCH: 32.5 pg (ref 26.0–34.0)
MCHC: 32.6 g/dL (ref 30.0–36.0)
MCV: 99.8 fL (ref 80.0–100.0)
Monocytes Absolute: 0.6 10*3/uL (ref 0.1–1.0)
Monocytes Relative: 6 %
Neutro Abs: 8.4 10*3/uL — ABNORMAL HIGH (ref 1.7–7.7)
Neutrophils Relative %: 83 %
Platelets: 184 10*3/uL (ref 150–400)
RBC: 4.64 MIL/uL (ref 3.87–5.11)
RDW: 15.1 % (ref 11.5–15.5)
WBC: 10 10*3/uL (ref 4.0–10.5)
nRBC: 0 % (ref 0.0–0.2)

## 2019-01-17 LAB — MRSA PCR SCREENING: MRSA by PCR: POSITIVE — AB

## 2019-01-17 LAB — RAPID URINE DRUG SCREEN, HOSP PERFORMED
Amphetamines: NOT DETECTED
Barbiturates: NOT DETECTED
Benzodiazepines: POSITIVE — AB
Cocaine: NOT DETECTED
Opiates: POSITIVE — AB
Tetrahydrocannabinol: NOT DETECTED

## 2019-01-17 LAB — SARS CORONAVIRUS 2 BY RT PCR (HOSPITAL ORDER, PERFORMED IN ~~LOC~~ HOSPITAL LAB): SARS Coronavirus 2: NEGATIVE

## 2019-01-17 LAB — CBG MONITORING, ED: Glucose-Capillary: 124 mg/dL — ABNORMAL HIGH (ref 70–99)

## 2019-01-17 LAB — ETHANOL: Alcohol, Ethyl (B): <10 mg/dL (ref <10)

## 2019-01-17 MED ORDER — LABETALOL HCL 5 MG/ML IV SOLN
INTRAVENOUS | Status: AC
  Administered 2019-01-17: 11:00:00 10 mg
  Filled 2019-01-17: qty 4

## 2019-01-17 MED ORDER — SODIUM CHLORIDE 0.9 % IV SOLN
INTRAVENOUS | Status: DC
  Administered 2019-01-17: 15:00:00 via INTRAVENOUS

## 2019-01-17 MED ORDER — STROKE: EARLY STAGES OF RECOVERY BOOK
Freq: Once | Status: DC
  Administered 2019-01-17: 16:00:00

## 2019-01-17 MED ORDER — MORPHINE 100MG IN NS 100ML (1MG/ML) PREMIX INFUSION
0.0000 mg/h | INTRAVENOUS | Status: DC
  Administered 2019-01-17: 5 mg/h via INTRAVENOUS
  Administered 2019-01-18: 11:00:00 15 mg/h via INTRAVENOUS
  Administered 2019-01-18: 04:00:00 10 mg/h via INTRAVENOUS
  Administered 2019-01-18 – 2019-01-19 (×3): 15 mg/h via INTRAVENOUS
  Filled 2019-01-17 (×6): qty 100

## 2019-01-17 MED ORDER — GLYCOPYRROLATE 1 MG PO TABS: 1.0000 mg | ORAL_TABLET | ORAL | Status: DC | PRN

## 2019-01-17 MED ORDER — SUCCINYLCHOLINE CHLORIDE 20 MG/ML IJ SOLN
INTRAMUSCULAR | Status: AC | PRN
  Administered 2019-01-17: 100 mg via INTRAVENOUS

## 2019-01-17 MED ORDER — ETOMIDATE 2 MG/ML IV SOLN
INTRAVENOUS | Status: AC | PRN
  Administered 2019-01-17: 30 mg via INTRAVENOUS

## 2019-01-17 MED ORDER — ACETAMINOPHEN 160 MG/5ML PO SOLN: 650.0000 mg | ORAL | Status: DC | PRN

## 2019-01-17 MED ORDER — GLYCOPYRROLATE 0.2 MG/ML IJ SOLN: 0.2000 mg | INTRAMUSCULAR | Status: DC | PRN

## 2019-01-17 MED ORDER — MORPHINE SULFATE (PF) 2 MG/ML IV SOLN: 2.0000 mg | INTRAVENOUS | Status: DC | PRN

## 2019-01-17 MED ORDER — CHLORHEXIDINE GLUCONATE CLOTH 2 % EX PADS
6.0000 | MEDICATED_PAD | Freq: Every day | CUTANEOUS | Status: DC
  Administered 2019-01-17: 12:00:00 6 via TOPICAL

## 2019-01-17 MED ORDER — PROPOFOL 1000 MG/100ML IV EMUL
5.0000 ug/kg/min | INTRAVENOUS | Status: DC
  Administered 2019-01-17: 10 ug/kg/min via INTRAVENOUS

## 2019-01-17 MED ORDER — ACETAMINOPHEN 650 MG RE SUPP: 650.0000 mg | RECTAL | Status: DC | PRN

## 2019-01-17 MED ORDER — ONDANSETRON HCL 4 MG/2ML IJ SOLN: 4.0000 mg | Freq: Four times a day (QID) | INTRAMUSCULAR | Status: DC | PRN

## 2019-01-17 MED ORDER — PANTOPRAZOLE SODIUM 40 MG IV SOLR: 40.0000 mg | Freq: Every day | INTRAVENOUS | Status: DC

## 2019-01-17 MED ORDER — FENTANYL CITRATE (PF) 100 MCG/2ML IJ SOLN
25.0000 ug | INTRAMUSCULAR | Status: DC | PRN
  Administered 2019-01-17: 100 ug via INTRAVENOUS
  Filled 2019-01-17: qty 2

## 2019-01-17 MED ORDER — MIDAZOLAM HCL 2 MG/2ML IJ SOLN
INTRAMUSCULAR | Status: AC
  Administered 2019-01-17: 11:00:00 5 mg
  Filled 2019-01-17: qty 6

## 2019-01-17 MED ORDER — ONDANSETRON 4 MG PO TBDP: 4.0000 mg | ORAL_TABLET | Freq: Four times a day (QID) | ORAL | Status: DC | PRN

## 2019-01-17 MED ORDER — ACETAMINOPHEN 325 MG PO TABS: 650.0000 mg | ORAL_TABLET | ORAL | Status: DC | PRN

## 2019-01-17 MED ORDER — SENNOSIDES-DOCUSATE SODIUM 8.6-50 MG PO TABS: 1.0000 | ORAL_TABLET | Freq: Two times a day (BID) | ORAL | Status: DC

## 2019-01-17 MED ORDER — EMPTY CONTAINERS FLEXIBLE MISC
900.0000 mg | Freq: Once | Status: AC
  Administered 2019-01-17: 11:00:00 900 mg via INTRAVENOUS
  Filled 2019-01-17: qty 90

## 2019-01-17 MED ORDER — LORAZEPAM 2 MG/ML IJ SOLN
1.0000 mg | Freq: Once | INTRAMUSCULAR | Status: AC
  Administered 2019-01-17: 10:00:00 1 mg via INTRAVENOUS
  Filled 2019-01-17: qty 1

## 2019-01-17 MED ORDER — CLEVIDIPINE BUTYRATE 0.5 MG/ML IV EMUL
0.0000 mg/h | INTRAVENOUS | Status: DC
  Administered 2019-01-17: 15 mg/h via INTRAVENOUS

## 2019-01-17 MED ORDER — FENTANYL CITRATE (PF) 100 MCG/2ML IJ SOLN: 25.0000 ug | INTRAMUSCULAR | Status: DC | PRN

## 2019-01-17 MED ORDER — POLYVINYL ALCOHOL 1.4 % OP SOLN
1.0000 [drp] | Freq: Four times a day (QID) | OPHTHALMIC | Status: DC | PRN
  Filled 2019-01-17: qty 15

## 2019-01-17 MED ORDER — SENNOSIDES 8.8 MG/5ML PO SYRP: 5.0000 mL | ORAL_SOLUTION | Freq: Two times a day (BID) | ORAL | Status: DC | PRN

## 2019-01-17 MED ORDER — ACETAMINOPHEN 650 MG RE SUPP: 650.0000 mg | Freq: Four times a day (QID) | RECTAL | Status: DC | PRN

## 2019-01-17 MED ORDER — MORPHINE BOLUS VIA INFUSION
5.0000 mg | INTRAVENOUS | Status: DC | PRN
  Administered 2019-01-17 – 2019-01-18 (×3): 5 mg via INTRAVENOUS
  Filled 2019-01-17: qty 5

## 2019-01-17 MED ORDER — ACETAMINOPHEN 325 MG PO TABS: 650.0000 mg | ORAL_TABLET | Freq: Four times a day (QID) | ORAL | Status: DC | PRN

## 2019-01-17 MED ORDER — BISACODYL 10 MG RE SUPP: 10.0000 mg | Freq: Every day | RECTAL | Status: DC | PRN

## 2019-01-17 MED ORDER — INSULIN ASPART 100 UNIT/ML ~~LOC~~ SOLN: 2.0000 [IU] | SUBCUTANEOUS | Status: DC

## 2019-01-17 MED ORDER — DIPHENHYDRAMINE HCL 50 MG/ML IJ SOLN: 25.0000 mg | INTRAMUSCULAR | Status: DC | PRN

## 2019-01-17 MED ORDER — PROPOFOL 1000 MG/100ML IV EMUL
INTRAVENOUS | Status: AC
  Filled 2019-01-17: qty 100

## 2019-01-17 MED ORDER — SODIUM CHLORIDE 0.9 % IV BOLUS
1000.0000 mL | Freq: Once | INTRAVENOUS | Status: AC
  Administered 2019-01-17: 1000 mL via INTRAVENOUS

## 2019-01-17 MED ORDER — DEXTROSE 5 % IV SOLN: INTRAVENOUS | Status: DC

## 2019-01-17 MED ORDER — PROPOFOL 1000 MG/100ML IV EMUL
5.0000 ug/kg/min | INTRAVENOUS | Status: DC
  Administered 2019-01-17: 15 ug/kg/min via INTRAVENOUS

## 2019-01-17 MED ORDER — LEVETIRACETAM IN NACL 1000 MG/100ML IV SOLN
1000.0000 mg | Freq: Once | INTRAVENOUS | Status: DC
  Filled 2019-01-17: qty 100

## 2019-01-17 NOTE — Progress Notes (Signed)
A set of glasses, a gold watch, black life alert bracelet, and gown returned to daughter. Lianne Bushy RN BSN.

## 2019-01-17 NOTE — ED Notes (Signed)
[facility] en route.  °

## 2019-01-17 NOTE — H&P (Addendum)
Neurology history and physical    History is obtained from: Chart  HPI: Diane Fry is a 85 y.o. female with history of pneumonia, hypertension, diabetes, CAD and anxiety.  Patient was found lying outside on the grass by her neighbor.  Per notes it sounds like the patient had been there since about 3 AM.  Patient was brought to [facility] long hospital where she was able to talk at that time and stated that she had a headache but denied any chest pain, shortness of breath.  While at [place] she was somnolent but was able to participate in exam.  She began to exhibit guppy breathing in the [facility] ED; the patient was immediately intubated and brought to [facility] for admission to the ICU under the Neurology service.  Neurosurgery was consulted for possible intervention with a ventriculostomy.  At the time patient had arrived at [facility] she had waxing and waning in her exam.  Initially she was posturing with noxious stimuli; however, after blood pressure came down and neurosurgery came to see her she was responding much better and actually squeezed hand, and with noxious stimuli to her legs began withdrawing from pain rather than posturing.  Blood pressure was controlled with Cardene.  Patient's daughter was called, she was driving from [employer].  It was discussed with her that the patient's prognosis for recovery to independence is very poor, and that she may not survive this significant stroke.  At that point patient called a friend who apparently knew her mother very closely.  Once it was discussed with patient that the ventriculostomy likely will not make a huge difference and patient will likely would be bedbound if not potentially end-of-life event.  Daughter decided that she did not want a significant effort and decided against the ventriculostomy.  This was discussed with Dr. Sherwood Gambler and at this point in time he is signing off as there is no further intervention for him to take part in.   Patient's daughter is driving to the hospital and further discussion with patient's daughter will be had.  She is the only child of the patient.  ED course CT of head, labs, transport to [facility] for further surgical intervention.   LKW: Unknown tpa given?: no, intracranial bleed Premorbid modified Rankin scale (mRS): 0 NIH stroke score of 31 ICH Score: 3   ROS:  Unable to obtain due to altered mental status.   Past Medical History:  Diagnosis Date  . Anxiety   . Coronary artery disease   . Diabetes mellitus without complication ([place])   . Hypertension   . Pneumonia 2015    No family history on file and unable to obtain secondary to patient being intubated  Social History:   reports that she has never smoked. She has never used smokeless tobacco. She reports that she does not drink alcohol or use drugs.  Medications  Current Facility-Administered Medications:  .  Chlorhexidine Gluconate Cloth 2 % PADS 6 each, 6 each, Topical, Daily, Kerney Elbe, MD, 6 each at 12/21/2018 1155 .  clevidipine (CLEVIPREX) infusion 0.5 mg/mL, 0-21 mg/hr, Intravenous, Continuous, Marliss Coots, PA-C, Last Rate: 30 mL/hr at 12/24/2018 1151, 15 mg/hr at 12/24/2018 1151 .  propofol (DIPRIVAN) 1000 MG/100ML infusion, , , ,  .  propofol (DIPRIVAN) 1000 MG/100ML infusion, 5-80 mcg/kg/min, Intravenous, Continuous, Lenis Noon, RPH   Exam: Current vital signs: BP 134/70   Pulse 96   Temp (!) 96.9 F (36.1 C) (Rectal)  Resp 12   Ht 5\' 3"  (1.6 m)   Wt 63.5 kg Comment: Estimated by EMS  SpO2 97%   BMI 24.80 kg/m  Vital signs in last 24 hours: Temp:  [96.9 F (36.1 C)] 96.9 F (36.1 C) (07/29 0825) Pulse Rate:  [92-119] 96 (07/29 1150) Resp:  [12-28] 12 (07/29 1156) BP: (134-232)/(70-173) 134/70 (07/29 1156) SpO2:  [91 %-100 %] 97 % (07/29 1150) FiO2 (%):  [100 %] 100 % (07/29 1139) Weight:  [63.5 kg] 63.5 kg (07/29 1106)  Physical Exam  Constitutional: Appears well-developed and  well-nourished.  Psych: Intubated and sedated Eyes: No scleral injection HENT: No OP obstrucion Head: Normocephalic.  Cardiovascular: Normal rate and regular rhythm.  Respiratory: Irregular GI: Soft.  No distension. There is no tenderness.  Skin: WDI  Neuro: Mental Status: Patient does  respond to verbal stimuli.  Does  respond to deep sternal rub extensor posturing.  Does not follow commands.  No verbalizations noted.  Cranial Nerves: II: patient does not respond confrontation bilaterally,  III,IV,VI: doll's response absent bilaterally. pupils right 1 mm, left 1 mm,and nonreactive bilaterally V,VII: corneal reflex absent bilaterally  VIII: patient does not respond to verbal stimuli IX,X: gag reflex absent, XI: trapezius strength unable to test bilaterally XII: tongue strength unable to test Motor: Extremities showing extensor posturing with any stimulation.  Spontaneous extensor posturing with noxious stimuli.  No purposeful movements noted. Sensory: responds to noxious stimuli by extensor posturing Deep Tendon Reflexes:  Responds to elicitation of reflexes with extensor posturing Plantars: Upgoing bilaterally  Secondary exam after BP decreased: Mental Status: Patient does  respond to verbal stimuli by gripping his hands.  Does  respond to deep sternal rub by grimacing and flexing legs.  As she is intubated Cranial Nerves: II: patient does not respond confrontation bilaterally,  III,IV,VI: doll's response present bilaterally. pupils right 2 mm, left 2 mm,and sluggishly reactive bilaterally V,VII: corneal reflex present bilaterally  VIII: patient does not respond to verbal stimuli IX,X: gag reflex absent, XI: trapezius strength unable to test bilaterally XII: tongue strength unable to test Motor: Withdraws to noxious stimuli at this point in time and there is no extensor posturing. Sensory: Doesrespond to noxious stimuli in any extremity. Deep Tendon Reflexes:  2+  throughout Plantars: upgoing bilaterally  20 minutes after her initial exam improvement (as documented above) with decreased BP at the time of Dr. Donnella Bi consultation, the patient's exam features reverted back to those of the first of the two exams documented above.   Labs I have reviewed labs in epic and the results pertinent to this consultation are:   CBC    Component Value Date/Time   WBC 10.0 12/26/2018 0814   RBC 4.64 12/23/2018 0814   HGB 15.1 (H) 12/29/2018 0814   HGB 15.8 09/07/2007 1308   HCT 46.3 (H) 12/20/2018 0814   HCT 45.2 09/07/2007 1308   PLT 184 01/06/2019 0814   PLT 183 09/07/2007 1308   MCV 99.8 12/25/2018 0814   MCV 95.6 09/07/2007 1308   MCH 32.5 12/23/2018 0814   MCHC 32.6 01/16/2019 0814   RDW 15.1 01/05/2019 0814   RDW 13.0 09/07/2007 1308   LYMPHSABS 1.1 12/26/2018 0814   LYMPHSABS 2.1 09/07/2007 1308   MONOABS 0.6 01/12/2019 0814   MONOABS 0.4 09/07/2007 1308   EOSABS 0.0 01/04/2019 0814   EOSABS 0.1 09/07/2007 1308   BASOSABS 0.0 01/01/2019 0814   BASOSABS 0.0 09/07/2007 1308    CMP     Component Value  Date/Time   NA 138 10/03/2016 0433   K 4.1 10/03/2016 0433   CL 100 (L) 10/03/2016 0433   CO2 30 10/03/2016 0433   GLUCOSE 131 (H) 10/03/2016 0433   BUN 27 (H) 10/03/2016 0433   CREATININE 1.07 (H) 10/03/2016 0433   CALCIUM 8.1 (L) 10/03/2016 0433   PROT 7.7 11/24/2014 1040   ALBUMIN 3.7 11/24/2014 1040   AST 23 11/24/2014 1040   ALT 20 11/24/2014 1040   ALKPHOS 75 11/24/2014 1040   BILITOT 1.1 11/24/2014 1040   GFRNONAA 46 (L) 10/03/2016 0433   GFRAA 53 (L) 10/03/2016 0433    Lipid Panel  No results found for: CHOL, TRIG, HDL, CHOLHDL, VLDL, LDLCALC, LDLDIRECT   Imaging I have reviewed the images obtained:  CT-scan of the brain--Large volume acute intraventricular hemorrhage with ventriculomegaly and transependymal edema. Unclear if this bleed might have originated in the right thalamus; it might be entirely  intraventricular. In which case consider coagulopathy. Trace leftward midline shift.  Basilar cisterns remain patent.No acute traumatic injury identified in the cervical spine.    Etta Quill PA-C [facility] [phone] 01/05/2019, 12:04 PM     Assessment:  85 year old female with extensive intraventricular hemorrhage and poor prognosis based on CT findings and exam.  1. May have been due to a right thalamic hypertensive hemorrhage followed by a fall, versus IVH secondary to a fall.  2. Eliquis has been reversed with Andexxa.  3. Discussion with neurosurgery to place a ventriculostomy was made.   Neurosurgery was prepared to place the ventriculostomy however after talking to daughter, and explaining that this is a life altering event and possibly end-of-life event.  Patient's daughter decided to not take part in any significant intervention. 4. Will no longer be able to be anticoagulated over the long term if she survives this event.   Recommendations: - Admitted to ICU under the Neurology service - Continue Cardene to keep systolic blood pressure under 140 - PCCM to control ventilator - NPO. at this time - Have discussion with daughter concerning palliative care versus hospice - Dr. Sherwood Gambler has been informed of daughter's decision -- CCM following for vent management  60 minutes spent in the emergent neurological evaluation and management of this critically ill patient. Time spent included review of images and coordination of care.   I have seen and examined the patient. I have formulated the assessment and plan. My exam findings were observed and documented by Etta Quill, PA.  Electronically signed: Dr. Kerney Elbe

## 2019-01-17 NOTE — Progress Notes (Signed)
Chaplain responded to spiritual consult.  Patient's daughter bedside for end of life. Chaplain present for extubation, offering ministry of support and prayers.   Chaplain learned that pt's daughter is NOK. Patient is divorced; pt had a son that predeceased her.  Daughter [name] will care for mother's arrangements and her funeral home choice is [employer].  Rev. Tamsen Snider Pager [phone]

## 2019-01-17 NOTE — Progress Notes (Signed)
Patients blood pressure out of parameters. PA with neurology at bedside. Order for BP management and carried out. Please see eMAR to titration. Lianne Bushy RN BSN.

## 2019-01-17 NOTE — Consult Note (Signed)
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 Lennice Sites and Dr. Kerney Elbe  Diane Fry is an 85 y.o. female.  HPI: Elderly female, found by neighbor down in her yard, who was brought to [facility] emergency room and evaluated by Dr. Ronnald Nian (EDP).  Work-up was initiated including CT of the head that revealed extensive intracranial hemorrhage.  Neurology was consulted, and then subsequently neurosurgery was consulted.  Dr. Ronnald Nian initiated treatment with Keppra.  Subsequently the patient was found to be anticoagulated with Eliquis (for reasons that are not known at this time) and pharmacy was consulted regarding reversal, that was initiated about an hour and a half ago, and which will be completed in about 30 minutes.  Patient was transferred from [facility] emergency room to the neuro ICU at [facility], and admitted to the stroke neurology service for further care.  Dr. Cheral Marker is requested neurosurgical consultation regarding the question of placement of an intraventricular catheter.  The patient was intubated prior to transfer, she was hypertensive and started on Cleviprex, and her blood pressure is better controlled.  Her neurologic examination has fluctuated, and her current exam is documented below.  The patient is unable to provide any history, and no family is present.  Reviewing the electronic record, she did undergo transsphenoidal resection of a pituitary macroadenoma by my partner Dr. Erline Levine 5 years ago.  Past Medical History:  Past Medical History:  Diagnosis Date  . Anxiety   . Coronary artery disease   . Diabetes mellitus without complication ([place])   . Hypertension   . Pneumonia 2015    Past Surgical History:  Past Surgical History:  Procedure Laterality Date  . CARDIAC CATHETERIZATION N/A 10/31/2014   Procedure: Left Heart Cath  and Coronary Angiography;  Surgeon: Charolette Forward, MD;  Location: [place] [facility];  Service: Cardiovascular;  Laterality: N/A;  . CATARACT EXTRACTION    . CORONARY ANGIOPLASTY WITH STENT PLACEMENT  10/31/2014   LAD   . CRANIOTOMY N/A 09/07/2013   Procedure: TRANSSPHENOIDAL RESECTION OF PITUITARY MACROADENOMA WITH ABDOMINAL FAT GRAPH;  Surgeon: Erline Levine, MD;  Location: [place];  Service: Neurosurgery;  Laterality: N/A;  CRANIOTOMY HYPOPHYSECTOMY TRANSNASAL APPROACH  . FEMUR IM NAIL Left 09/30/2016   Procedure: INTRAMEDULLARY (IM) NAIL FEMORAL;  Surgeon: Nicholes Stairs, MD;  Location: [facility];  Service: Orthopedics;  Laterality: Left;  . PERCUTANEOUS CORONARY STENT INTERVENTION (PCI-S)  10/31/2014   Procedure: Percutaneous Coronary Stent Intervention (Pci-S);  Surgeon: Charolette Forward, MD;  Location: [place] [facility];  Service: Cardiovascular;;  . TRANSNASAL APPROACH N/A 09/07/2013   Procedure: TRANSNASAL APPROACH;  Surgeon: Izora Gala, MD;  Location: [facility];  Service: ENT;  Laterality: N/A;    Family History: Not obtainable at this time.  Social History: Patient apparently lives alone, and her near his family apparently is 4 hours away (per EDP).  Allergies: No Known Allergies  Medications: I have reviewed the patient's current medications.  ROS: Not obtainable due to altered mental status.  Physical Examination: Elderly female, intubated, supported on a ventilator via ETT. Blood pressure 127/75, pulse 96, temperature (!) 96.1 F (35.6 C), temperature source Axillary, resp. rate 17, height 5\' 3"  (1.6 m), weight 63.5 kg, SpO2 100 %.  Neurological Examination: Mental Status Examination: Not opening eyes to voice or vigorous stimulation.  No attempts at speech.  Did follow some rare simple commands. Cranial Nerve Examination:  Pupils 2 mm, round, sluggishly reactive to light.  Doll's eyes present. Motor Examination: Purposeful movement with extremities.  Did squeeze  right hand to command. Sensory Examination: Not testable. Reflex Examination:   Areflexic. Gait and Stance Examination: Not testable.   Results for orders placed or performed during the hospital encounter of 12/29/2018 (from the past 48 hour(s))  Urinalysis, Complete w Microscopic     Status: Abnormal   Collection Time: 01/13/2019  7:52 AM  Result Value Ref Range   Color, Urine YELLOW YELLOW   APPearance HAZY (A) CLEAR   Specific Gravity, Urine 1.013 1.005 - 1.030   pH 6.0 5.0 - 8.0   Glucose, UA NEGATIVE NEGATIVE mg/dL   Hgb urine dipstick NEGATIVE NEGATIVE   Bilirubin Urine NEGATIVE NEGATIVE   Ketones, ur NEGATIVE NEGATIVE mg/dL   Protein, ur NEGATIVE NEGATIVE mg/dL   Nitrite NEGATIVE NEGATIVE   Leukocytes,Ua TRACE (A) NEGATIVE   RBC / HPF 0-5 0 - 5 RBC/hpf   WBC, UA 11-20 0 - 5 WBC/hpf   Bacteria, UA RARE (A) NONE SEEN   Mucus PRESENT    Hyaline Casts, UA PRESENT     Comment: Performed at [facility], [place] [address], [place], [place] [address]  Rapid urine drug screen (hospital performed)     Status: Abnormal   Collection Time: 01/08/2019  7:53 AM  Result Value Ref Range   Opiates POSITIVE (A) NONE DETECTED   Cocaine NONE DETECTED NONE DETECTED   Benzodiazepines POSITIVE (A) NONE DETECTED   Amphetamines NONE DETECTED NONE DETECTED   Tetrahydrocannabinol NONE DETECTED NONE DETECTED   Barbiturates NONE DETECTED NONE DETECTED    Comment: (NOTE) DRUG SCREEN FOR MEDICAL PURPOSES ONLY.  IF CONFIRMATION IS NEEDED FOR ANY PURPOSE, NOTIFY LAB WITHIN 5 DAYS. LOWEST DETECTABLE LIMITS FOR URINE DRUG SCREEN Drug Class                     Cutoff (ng/mL) Amphetamine and metabolites    1000 Barbiturate and metabolites    200 Benzodiazepine                 027 Tricyclics and metabolites     300 Opiates and metabolites        300 Cocaine and metabolites        300 THC                            50 Performed at [facility], [place] [address] [place], [place] [address]   CBG monitoring, ED     Status: Abnormal   Collection Time: 01/11/2019  8:09 AM  Result Value Ref Range   Glucose-Capillary 124 (H) 70 - 99 mg/dL  CBC WITH DIFFERENTIAL     Status: Abnormal   Collection Time: 01/13/2019  8:14 AM  Result Value Ref Range   WBC 10.0 4.0 - 10.5 K/uL   RBC 4.64 3.87 - 5.11 MIL/uL   Hemoglobin 15.1 (H) 12.0 - 15.0 g/dL   HCT 46.3 (H) 36.0 - 46.0 %   MCV 99.8 80.0 - 100.0 fL   MCH 32.5 26.0 - 34.0 pg   MCHC 32.6 30.0 - 36.0 g/dL   RDW 15.1 11.5 - 15.5 %   Platelets 184 150 - 400 K/uL   nRBC 0.0 0.0 - 0.2 %   Neutrophils Relative % 83 %   Neutro Abs 8.4 (H) 1.7 - 7.7 K/uL   Lymphocytes Relative  11 %   Lymphs Abs 1.1 0.7 - 4.0 K/uL   Monocytes Relative 6 %   Monocytes Absolute 0.6 0.1 - 1.0 K/uL   Eosinophils Relative 0 %   Eosinophils Absolute 0.0 0.0 - 0.5 K/uL   Basophils Relative 0 %   Basophils Absolute 0.0 0.0 - 0.1 K/uL   Immature Granulocytes 0 %   Abs Immature Granulocytes 0.04 0.00 - 0.07 K/uL    Comment: Performed at [facility], [place] [address], [place], [place] [address]  Ethanol     Status: None   Collection Time: 01/10/2019  8:14 AM  Result Value Ref Range   Alcohol, Ethyl (B) <10 <10 mg/dL    Comment: (NOTE) Lowest detectable limit for serum alcohol is 10 mg/dL. For medical purposes only. Performed at [facility], [place] [address], [place], [place] [address]   SARS Coronavirus 2 (CEPHEID - Performed in [place] hospital lab), Hosp Order     Status: None   Collection Time: 12/28/2018  9:38 AM   Specimen: Nasopharyngeal Swab  Result Value Ref Range   SARS Coronavirus 2 NEGATIVE NEGATIVE    Comment: (NOTE) If result is NEGATIVE SARS-CoV-2 target nucleic acids are NOT DETECTED. The SARS-CoV-2 RNA is generally detectable in upper and lower  respiratory specimens during the acute phase of infection. The lowest  concentration of SARS-CoV-2 viral copies this assay can  detect is 250  copies / mL. A negative result does not preclude SARS-CoV-2 infection  and should not be used as the sole basis for treatment or other  patient management decisions.  A negative result may occur with  improper specimen collection / handling, submission of specimen other  than nasopharyngeal swab, presence of viral mutation(s) within the  areas targeted by this assay, and inadequate number of viral copies  (<250 copies / mL). A negative result must be combined with clinical  observations, patient history, and epidemiological information. If result is POSITIVE SARS-CoV-2 target nucleic acids are DETECTED. The SARS-CoV-2 RNA is generally detectable in upper and lower  respiratory specimens dur ing the acute phase of infection.  Positive  results are indicative of active infection with SARS-CoV-2.  Clinical  correlation with patient history and other diagnostic information is  necessary to determine patient infection status.  Positive results do  not rule out bacterial infection or co-infection with other viruses. If result is PRESUMPTIVE POSTIVE SARS-CoV-2 nucleic acids MAY BE PRESENT.   A presumptive positive result was obtained on the submitted specimen  and confirmed on repeat testing.  While 2019 novel coronavirus  (SARS-CoV-2) nucleic acids may be present in the submitted sample  additional confirmatory testing may be necessary for epidemiological  and / or clinical management purposes  to differentiate between  SARS-CoV-2 and other Sarbecovirus currently known to infect humans.  If clinically indicated additional testing with an alternate test  methodology [phone]) is advised. The SARS-CoV-2 RNA is generally  detectable in upper and lower respiratory sp ecimens during the acute  phase of infection. The expected result is Negative. Fact Sheet for Patients:  StrictlyIdeas.no Fact Sheet for Healthcare  Providers: BankingDealers.co.za This test is not yet approved or cleared by the [place] FDA and has been authorized for detection and/or diagnosis of SARS-CoV-2 by FDA under an Emergency Use Authorization (EUA).  This EUA will remain in effect (meaning this test can be used) for the duration of the COVID-19 declaration under Section 564(b)(1) of the Act, 21 U.S.C. section 360bbb-3(b)(1), unless  the authorization is terminated or revoked sooner. Performed at [facility], [place] [address], [place], [place] [address]     Ct Head Wo Contrast  Result Date: 12/22/2018 CLINICAL DATA:  85 year old female found down. EXAM: CT HEAD WITHOUT CONTRAST CT CERVICAL SPINE WITHOUT CONTRAST TECHNIQUE: Multidetector CT imaging of the head and cervical spine was performed following the standard protocol without intravenous contrast. Multiplanar CT image reconstructions of the cervical spine were also generated. COMPARISON:  Brain MRI 01/25/2014. FINDINGS: CT HEAD FINDINGS Brain: Moderate to large volume acute intraventricular hemorrhage with associated lateral and 3rd ventriculomegaly. Dilated temporal horns and evidence of transependymal edema. Confluent hemorrhage along the septum pellucidum. It is unclear whether there might be a small component of intra-axial blood in the right deep gray matter nuclei. Small volume of 4th ventricular blood. No superimposed subdural or subarachnoid hemorrhage. Confluent cerebral white matter hypodensity but no acute cortically based infarct identified. Basilar cisterns remain patent at this time. There is trace leftward midline shift. Vascular: Extensive Calcified atherosclerosis at the skull base. Skull: Intact. Sinuses/Orbits: Chronic right sphenoid sinus mucosal thickening. Other: Left superior convexity scalp hematoma measuring about 5 millimeters in thickness. Underlying calvarium intact. Other visible scalp and face soft tissues  appear negative. CT CERVICAL SPINE FINDINGS Alignment: Straightening of cervical lordosis. Mild degenerative appearing anterolisthesis of C7 on T1. Bilateral posterior element alignment is within normal limits. Skull base and vertebrae: Osteopenia. Visualized skull base is intact. No atlanto-occipital dissociation. No acute osseous abnormality identified. Soft tissues and spinal canal: No prevertebral fluid or swelling. No visible canal hematoma. Partially retropharyngeal course of the carotids, with calcified atherosclerosis. Disc levels: Posterior element ankylosis C3 through C5, probably with developing C2-C3 ankylosis. Mild if any degenerative cervical spinal stenosis. Upper chest: Mild-to-moderate T4 compression fracture is new since the 2015 chest CT, but may be chronic. No significant retropulsion. Other visible upper thoracic levels appear intact. Negative lung apices, mild respiratory motion. Negative noncontrast thoracic inlet. IMPRESSION: 1. Large volume acute intraventricular hemorrhage with ventriculomegaly and transependymal edema. Unclear if this bleed might have originated in the right thalamus; it might be entirely intraventricular. In which case consider coagulopathy. 2. Trace leftward midline shift.  Basilar cisterns remain patent. 3.  No acute traumatic injury identified in the cervical spine. 4. Age indeterminate T4 compression fracture, new since 1751, no complicating features. Critical Value/emergent results were called by telephone at the time of interpretation on 12/23/2018 at 9:29 am to Dr. Lennice Sites , who verbally acknowledged these results. Electronically Signed   By: Genevie Ann M.D.   On: 12/26/2018 09:32   Ct Cervical Spine Wo Contrast  Result Date: 01/09/2019 CLINICAL DATA:  85 year old female found down. EXAM: CT HEAD WITHOUT CONTRAST CT CERVICAL SPINE WITHOUT CONTRAST TECHNIQUE: Multidetector CT imaging of the head and cervical spine was performed following the standard protocol  without intravenous contrast. Multiplanar CT image reconstructions of the cervical spine were also generated. COMPARISON:  Brain MRI 01/25/2014. FINDINGS: CT HEAD FINDINGS Brain: Moderate to large volume acute intraventricular hemorrhage with associated lateral and 3rd ventriculomegaly. Dilated temporal horns and evidence of transependymal edema. Confluent hemorrhage along the septum pellucidum. It is unclear whether there might be a small component of intra-axial blood in the right deep gray matter nuclei. Small volume of 4th ventricular blood. No superimposed subdural or subarachnoid hemorrhage. Confluent cerebral white matter hypodensity but no acute cortically based infarct identified. Basilar cisterns remain patent at this time. There is trace leftward midline shift. Vascular: Extensive  Calcified atherosclerosis at the skull base. Skull: Intact. Sinuses/Orbits: Chronic right sphenoid sinus mucosal thickening. Other: Left superior convexity scalp hematoma measuring about 5 millimeters in thickness. Underlying calvarium intact. Other visible scalp and face soft tissues appear negative. CT CERVICAL SPINE FINDINGS Alignment: Straightening of cervical lordosis. Mild degenerative appearing anterolisthesis of C7 on T1. Bilateral posterior element alignment is within normal limits. Skull base and vertebrae: Osteopenia. Visualized skull base is intact. No atlanto-occipital dissociation. No acute osseous abnormality identified. Soft tissues and spinal canal: No prevertebral fluid or swelling. No visible canal hematoma. Partially retropharyngeal course of the carotids, with calcified atherosclerosis. Disc levels: Posterior element ankylosis C3 through C5, probably with developing C2-C3 ankylosis. Mild if any degenerative cervical spinal stenosis. Upper chest: Mild-to-moderate T4 compression fracture is new since the 2015 chest CT, but may be chronic. No significant retropulsion. Other visible upper thoracic levels appear  intact. Negative lung apices, mild respiratory motion. Negative noncontrast thoracic inlet. IMPRESSION: 1. Large volume acute intraventricular hemorrhage with ventriculomegaly and transependymal edema. Unclear if this bleed might have originated in the right thalamus; it might be entirely intraventricular. In which case consider coagulopathy. 2. Trace leftward midline shift.  Basilar cisterns remain patent. 3.  No acute traumatic injury identified in the cervical spine. 4. Age indeterminate T4 compression fracture, new since 6578, no complicating features. Critical Value/emergent results were called by telephone at the time of interpretation on 01/16/2019 at 9:29 am to Dr. Lennice Sites , who verbally acknowledged these results. Electronically Signed   By: Genevie Ann M.D.   On: 01/08/2019 09:32   Dg Chest Portable 1 View  Result Date: 01/14/2019 CLINICAL DATA:  Patient status post intubation today. EXAM: PORTABLE CHEST 1 VIEW COMPARISON:  Single-view of the chest earlier today. FINDINGS: New endotracheal tube is in place with the tip 2.3 cm above the carina. Basilar atelectasis noted. Heart size is upper normal. Atherosclerosis noted. IMPRESSION: ET tube tip is 2.3 cm above the carina. Atherosclerosis. Electronically Signed   By: Inge Rise M.D.   On: 12/30/2018 11:32   Dg Chest Portable 1 View  Result Date: 01/13/2019 CLINICAL DATA:  Altered mental status. The patient was found down. EXAM: PORTABLE CHEST 1 VIEW COMPARISON:  09/30/2016 and 08/02/2016 FINDINGS: Heart size and pulmonary vascularity are normal. Lungs are clear. Extensive coronary artery stents are noted. No acute bone abnormality. Aortic atherosclerosis. IMPRESSION: 1. No acute cardiopulmonary disease. 2. Aortic atherosclerosis. Electronically Signed   By: Lorriane Shire M.D.   On: 01/06/2019 08:53     Assessment/Plan: Elderly female on Eliquis (for reasons unknown) who was found on the ground outside of her home by a neighbor.  Work-up  is revealed a right thalamic intracerebral hematoma with secondary intraventricular hemorrhage and increased ventricular size.  Of note MRI scan in 2015 showed significant generalized cerebral atrophy as well as ventricular enlargement consistent with that atrophy.  The ventricles are somewhat larger than they were seen 5 years ago, however the basal cisterns are widely open.  Patient's neurologic response has declined since arrival at the [facility] ER, and she since has been intubated, and is on Cleviprex for control of hypertension.  Her prognosis for recovery to independence is poor, and she may not survive this significant stroke.  Once her anticoagulation is reversed, we can consider placement of an intraventricular catheter.  The stroke neurology services attempted to contact her family to ascertain her wishes for intensive intervention in the setting of a catastrophic, potentially end-of-life event.  Case discussed  with Dr. Cheral Marker.  Hosie Spangle, MD 01/02/2019, 12:27 PM

## 2019-01-17 NOTE — ED Provider Notes (Signed)
[place] DEPT Provider Note   CSN: [id number] Arrival date & time: 01/10/2019  8003    History   Chief Complaint Chief Complaint  Patient presents with   Altered Mental Status    HPI Diane Fry is a 85 y.o. female.     The history is provided by the patient and the EMS personnel.  Altered Mental Status Presenting symptoms: confusion, lethargy and partial responsiveness   Severity:  Moderate Most recent episode:  Today Episode history:  Continuous Timing:  Constant Progression:  Improving Chronicity:  New Context: not dementia and not nursing home resident   Context comment:  Patient found outside by her neighbor lying on the grass, confused.  Per EMS patient with normal vitals, has been more awake since their arrival. Associated symptoms: headaches   Associated symptoms: no abdominal pain, no fever, no light-headedness, no palpitations, no rash, no seizures, no vomiting and no weakness     Past Medical History:  Diagnosis Date   Anxiety    Coronary artery disease    Diabetes mellitus without complication ([place])    Hypertension    Pneumonia 2015    Patient Active Problem List   Diagnosis Date Noted   IVH (intraventricular hemorrhage) ([place]) 01/18/2019   Closed left hip fracture, initial encounter ([place]) 09/30/2016   Acute on chronic left systolic heart failure ([place]) 11/24/2014   New-onset angina ([place]) 10/31/2014   Paroxysmal atrial fibrillation ([place]) 05/15/2014   Community acquired bacterial pneumonia 05/10/2014   Coronary atherosclerosis of native coronary artery 05/10/2014   Type 2 diabetes mellitus ([place]) 05/10/2014   Hypotension 05/10/2014   Acute kidney injury ([place]) 05/10/2014   Pituitary macroadenoma with extrasellar extension ([place]) 09/07/2013   Pituitary macroadenoma ([place]) 09/07/2013    Past Surgical History:  Procedure Laterality Date   CARDIAC CATHETERIZATION N/A 10/31/2014   Procedure: Left Heart  Cath and Coronary Angiography;  Surgeon: Charolette Forward, MD;  Location: [facility];  Service: Cardiovascular;  Laterality: N/A;   CATARACT EXTRACTION     CORONARY ANGIOPLASTY WITH STENT PLACEMENT  10/31/2014   LAD    CRANIOTOMY N/A 09/07/2013   Procedure: TRANSSPHENOIDAL RESECTION OF PITUITARY MACROADENOMA WITH ABDOMINAL FAT GRAPH;  Surgeon: Erline Levine, MD;  Location: [facility];  Service: Neurosurgery;  Laterality: N/A;  CRANIOTOMY HYPOPHYSECTOMY TRANSNASAL APPROACH   FEMUR IM NAIL Left 09/30/2016   Procedure: INTRAMEDULLARY (IM) NAIL FEMORAL;  Surgeon: Nicholes Stairs, MD;  Location: [facility];  Service: Orthopedics;  Laterality: Left;   PERCUTANEOUS CORONARY STENT INTERVENTION (PCI-S)  10/31/2014   Procedure: Percutaneous Coronary Stent Intervention (Pci-S);  Surgeon: Charolette Forward, MD;  Location: [facility];  Service: Cardiovascular;;   TRANSNASAL APPROACH N/A 09/07/2013   Procedure: TRANSNASAL APPROACH;  Surgeon: Izora Gala, MD;  Location: [facility];  Service: ENT;  Laterality: N/A;     OB History   No obstetric history on file.      Home Medications    Prior to Admission medications   Medication Sig Start Date End Date Taking? Authorizing Provider  ALPRAZolam (XANAX) 0.25 MG tablet Take 1 tablet (0.25 mg total) by mouth 4 (four) times daily as needed for anxiety. 10/04/16   Elgergawy, Silver Huguenin, MD  amiodarone (PACERONE) 200 MG tablet Take 100 mg by mouth daily.    [provider]  apixaban (ELIQUIS) 5 MG TABS tablet Take 1 tablet (5 mg total) by mouth 2 (two) times daily. 10/04/16   Elgergawy, Silver Huguenin, MD  atorvastatin (LIPITOR) 80 MG tablet Take 40 mg by mouth daily.    [provider]  docusate sodium (COLACE) 100 MG capsule Take 1 capsule (100 mg total) by mouth 2 (two) times daily. 10/04/16   Elgergawy, Silver Huguenin, MD  feeding supplement, ENSURE ENLIVE, (ENSURE ENLIVE) LIQD Take 237 mLs by mouth 2 (two) times daily between meals. 10/04/16    Elgergawy, Silver Huguenin, MD  ferrous sulfate 325 (65 FE) MG tablet Take 1 tablet (325 mg total) by mouth daily with breakfast. 10/04/16   Elgergawy, Silver Huguenin, MD  furosemide (LASIX) 40 MG tablet Take 2 tablets (80 mg total) by mouth daily. Pt takes two tablets in the morning and one at bedtime. 10/04/16   Elgergawy, Silver Huguenin, MD  HYDROcodone-acetaminophen (NORCO) 5-325 MG tablet Take 1-2 tablets by mouth every 6 (six) hours as needed for moderate pain. 10/04/16   Elgergawy, Silver Huguenin, MD  isosorbide mononitrate (IMDUR) 30 MG 24 hr tablet Take 1 tablet (30 mg total) by mouth every morning. 10/04/16   Elgergawy, Silver Huguenin, MD  levothyroxine (SYNTHROID, LEVOTHROID) 50 MCG tablet Take 50 mcg by mouth daily before breakfast.     [provider]  losartan (COZAAR) 50 MG tablet Take 50 mg by mouth daily.    [provider]  metFORMIN (GLUCOPHAGE) 500 MG tablet Take 1 tablet (500 mg total) by mouth daily. 11/02/14   Charolette Forward, MD  methocarbamol (ROBAXIN) 500 MG tablet Take 1 tablet (500 mg total) by mouth every 8 (eight) hours as needed for muscle spasms. 10/04/16   Elgergawy, Silver Huguenin, MD  metoprolol tartrate (LOPRESSOR) 25 MG tablet Take 0.5 tablets (12.5 mg total) by mouth 2 (two) times daily. 11/28/14   Charolette Forward, MD  nitroGLYCERIN (NITROSTAT) 0.4 MG SL tablet Place 1 tablet (0.4 mg total) under the tongue every 5 (five) minutes as needed for chest pain. 11/01/14   Charolette Forward, MD  Omega-3 Fatty Acids (FISH OIL PO) Take 1 capsule by mouth daily.    [provider]  Polyethyl Glycol-Propyl Glycol 0.4-0.3 % SOLN Place 1-2 drops into both eyes every 2 (two) hours as needed (for dry eyes).     [provider]  potassium chloride SA (K-DUR,KLOR-CON) 20 MEQ tablet Take 20 mEq by mouth daily.    [provider]  triamcinolone cream (KENALOG) 0.1 % Apply 1 application topically 3 (three) times daily as needed (for itching/irritation).    [provider]     Family History No family history on file.  Social History Social History   Tobacco Use   Smoking status: Never Smoker   Smokeless tobacco: Never Used  Substance Use Topics   Alcohol use: No   Drug use: No     Allergies   Patient has no known allergies.   Review of Systems Review of Systems  Constitutional: Negative for chills and fever.  HENT: Negative for ear pain and sore throat.   Eyes: Negative for pain and visual disturbance.  Respiratory: Negative for cough and shortness of breath.   Cardiovascular: Negative for chest pain and palpitations.  Gastrointestinal: Negative for abdominal pain and vomiting.  Genitourinary: Negative for dysuria and hematuria.  Musculoskeletal: Negative for arthralgias and back pain.  Skin: Negative for color change and rash.  Neurological: Positive for headaches. Negative for dizziness, tremors, seizures, syncope, facial asymmetry, speech difficulty, weakness, light-headedness and numbness.  Psychiatric/Behavioral: Positive for confusion.  All other systems reviewed and are negative.    Physical Exam Updated Vital  Signs  ED Triage Vitals  Enc Vitals Group     BP 01/01/2019 0819 (!) 161/102     Pulse Rate 01/12/2019 0819 92     Resp 12/31/2018 0819 (!) 28     Temp 01/09/2019 0825 (!) 96.9 F (36.1 C)     Temp Source 12/28/2018 0825 Rectal     SpO2 01/10/2019 0819 93 %     Weight 12/28/2018 1106 140 lb (63.5 kg)     Height 01/08/2019 1000 5\' 3"  (1.6 m)     Head Circumference --      Peak Flow --      Pain Score --      Pain Loc --      Pain Edu? --      Excl. in [place]? --      Physical Exam Vitals signs and nursing note reviewed.  Constitutional:      General: She is not in acute distress.    Appearance: She is well-developed. She is ill-appearing.  HENT:     Head: Normocephalic and atraumatic.     Nose: Nose normal.     Mouth/Throat:     Mouth: Mucous membranes are moist.  Eyes:     Extraocular Movements: Extraocular  movements intact.     Conjunctiva/sclera: Conjunctivae normal.     Pupils: Pupils are equal, round, and reactive to light.  Neck:     Musculoskeletal: Normal range of motion and neck supple. No muscular tenderness.  Cardiovascular:     Rate and Rhythm: Normal rate and regular rhythm.     Pulses: Normal pulses.     Heart sounds: Normal heart sounds. No murmur.  Pulmonary:     Effort: Pulmonary effort is normal. No respiratory distress.     Breath sounds: Normal breath sounds.  Abdominal:     Palpations: Abdomen is soft.     Tenderness: There is no abdominal tenderness.  Musculoskeletal: Normal range of motion.        General: No tenderness.  Skin:    General: Skin is warm and dry.     Comments: Small abrasion to left eyebrow  Neurological:     General: No focal deficit present.     Mental Status: She is alert.     Comments: Patient moves all extremities, she can tell me her name, no obvious weakness or sensation changes, patient is somnolent but easily arousable and participates in most of exam      ED Treatments / Results  Labs (all labs ordered are listed, but only abnormal results are displayed) Labs Reviewed  CBC WITH DIFFERENTIAL/PLATELET - Abnormal; Notable for the following components:      Result Value   Hemoglobin 15.1 (*)    HCT 46.3 (*)    Neutro Abs 8.4 (*)    All other components within normal limits  URINALYSIS, COMPLETE (UACMP) WITH MICROSCOPIC - Abnormal; Notable for the following components:   APPearance HAZY (*)    Leukocytes,Ua TRACE (*)    Bacteria, UA RARE (*)    All other components within normal limits  RAPID URINE DRUG SCREEN, HOSP PERFORMED - Abnormal; Notable for the following components:   Opiates POSITIVE (*)    Benzodiazepines POSITIVE (*)    All other components within normal limits  CBG MONITORING, ED - Abnormal; Notable for the following components:   Glucose-Capillary 124 (*)    All other components within normal limits  SARS  CORONAVIRUS 2 (HOSPITAL ORDER, [facility])  URINE CULTURE  ETHANOL  AMMONIA  CK  COMPREHENSIVE METABOLIC PANEL    EKG None  Radiology Ct Head Wo Contrast  Result Date: 12/24/2018 CLINICAL DATA:  85 year old female found down. EXAM: CT HEAD WITHOUT CONTRAST CT CERVICAL SPINE WITHOUT CONTRAST TECHNIQUE: Multidetector CT imaging of the head and cervical spine was performed following the standard protocol without intravenous contrast. Multiplanar CT image reconstructions of the cervical spine were also generated. COMPARISON:  Brain MRI 01/25/2014. FINDINGS: CT HEAD FINDINGS Brain: Moderate to large volume acute intraventricular hemorrhage with associated lateral and 3rd ventriculomegaly. Dilated temporal horns and evidence of transependymal edema. Confluent hemorrhage along the septum pellucidum. It is unclear whether there might be a small component of intra-axial blood in the right deep gray matter nuclei. Small volume of 4th ventricular blood. No superimposed subdural or subarachnoid hemorrhage. Confluent cerebral white matter hypodensity but no acute cortically based infarct identified. Basilar cisterns remain patent at this time. There is trace leftward midline shift. Vascular: Extensive Calcified atherosclerosis at the skull base. Skull: Intact. Sinuses/Orbits: Chronic right sphenoid sinus mucosal thickening. Other: Left superior convexity scalp hematoma measuring about 5 millimeters in thickness. Underlying calvarium intact. Other visible scalp and face soft tissues appear negative. CT CERVICAL SPINE FINDINGS Alignment: Straightening of cervical lordosis. Mild degenerative appearing anterolisthesis of C7 on T1. Bilateral posterior element alignment is within normal limits. Skull base and vertebrae: Osteopenia. Visualized skull base is intact. No atlanto-occipital dissociation. No acute osseous abnormality identified. Soft tissues and spinal canal: No prevertebral fluid or  swelling. No visible canal hematoma. Partially retropharyngeal course of the carotids, with calcified atherosclerosis. Disc levels: Posterior element ankylosis C3 through C5, probably with developing C2-C3 ankylosis. Mild if any degenerative cervical spinal stenosis. Upper chest: Mild-to-moderate T4 compression fracture is new since the 2015 chest CT, but may be chronic. No significant retropulsion. Other visible upper thoracic levels appear intact. Negative lung apices, mild respiratory motion. Negative noncontrast thoracic inlet. IMPRESSION: 1. Large volume acute intraventricular hemorrhage with ventriculomegaly and transependymal edema. Unclear if this bleed might have originated in the right thalamus; it might be entirely intraventricular. In which case consider coagulopathy. 2. Trace leftward midline shift.  Basilar cisterns remain patent. 3.  No acute traumatic injury identified in the cervical spine. 4. Age indeterminate T4 compression fracture, new since 9485, no complicating features. Critical Value/emergent results were called by telephone at the time of interpretation on 01/10/2019 at 9:29 am to Dr. Lennice Sites , who verbally acknowledged these results. Electronically Signed   By: Genevie Ann M.D.   On: 01/15/2019 09:32   Ct Cervical Spine Wo Contrast  Result Date: 12/25/2018 CLINICAL DATA:  85 year old female found down. EXAM: CT HEAD WITHOUT CONTRAST CT CERVICAL SPINE WITHOUT CONTRAST TECHNIQUE: Multidetector CT imaging of the head and cervical spine was performed following the standard protocol without intravenous contrast. Multiplanar CT image reconstructions of the cervical spine were also generated. COMPARISON:  Brain MRI 01/25/2014. FINDINGS: CT HEAD FINDINGS Brain: Moderate to large volume acute intraventricular hemorrhage with associated lateral and 3rd ventriculomegaly. Dilated temporal horns and evidence of transependymal edema. Confluent hemorrhage along the septum pellucidum. It is unclear  whether there might be a small component of intra-axial blood in the right deep gray matter nuclei. Small volume of 4th ventricular blood. No superimposed subdural or subarachnoid hemorrhage. Confluent cerebral white matter hypodensity but no acute cortically based infarct identified. Basilar cisterns remain patent at this time. There is trace leftward midline shift. Vascular: Extensive Calcified atherosclerosis at the skull  base. Skull: Intact. Sinuses/Orbits: Chronic right sphenoid sinus mucosal thickening. Other: Left superior convexity scalp hematoma measuring about 5 millimeters in thickness. Underlying calvarium intact. Other visible scalp and face soft tissues appear negative. CT CERVICAL SPINE FINDINGS Alignment: Straightening of cervical lordosis. Mild degenerative appearing anterolisthesis of C7 on T1. Bilateral posterior element alignment is within normal limits. Skull base and vertebrae: Osteopenia. Visualized skull base is intact. No atlanto-occipital dissociation. No acute osseous abnormality identified. Soft tissues and spinal canal: No prevertebral fluid or swelling. No visible canal hematoma. Partially retropharyngeal course of the carotids, with calcified atherosclerosis. Disc levels: Posterior element ankylosis C3 through C5, probably with developing C2-C3 ankylosis. Mild if any degenerative cervical spinal stenosis. Upper chest: Mild-to-moderate T4 compression fracture is new since the 2015 chest CT, but may be chronic. No significant retropulsion. Other visible upper thoracic levels appear intact. Negative lung apices, mild respiratory motion. Negative noncontrast thoracic inlet. IMPRESSION: 1. Large volume acute intraventricular hemorrhage with ventriculomegaly and transependymal edema. Unclear if this bleed might have originated in the right thalamus; it might be entirely intraventricular. In which case consider coagulopathy. 2. Trace leftward midline shift.  Basilar cisterns remain patent. 3.   No acute traumatic injury identified in the cervical spine. 4. Age indeterminate T4 compression fracture, new since 1443, no complicating features. Critical Value/emergent results were called by telephone at the time of interpretation on 12/24/2018 at 9:29 am to Dr. Lennice Sites , who verbally acknowledged these results. Electronically Signed   By: Genevie Ann M.D.   On: 01/18/2019 09:32   Dg Chest Portable 1 View  Result Date: 01/08/2019 CLINICAL DATA:  Patient status post intubation today. EXAM: PORTABLE CHEST 1 VIEW COMPARISON:  Single-view of the chest earlier today. FINDINGS: New endotracheal tube is in place with the tip 2.3 cm above the carina. Basilar atelectasis noted. Heart size is upper normal. Atherosclerosis noted. IMPRESSION: ET tube tip is 2.3 cm above the carina. Atherosclerosis. Electronically Signed   By: Inge Rise M.D.   On: 01/02/2019 11:32   Dg Chest Portable 1 View  Result Date: 12/20/2018 CLINICAL DATA:  Altered mental status. The patient was found down. EXAM: PORTABLE CHEST 1 VIEW COMPARISON:  09/30/2016 and 08/02/2016 FINDINGS: Heart size and pulmonary vascularity are normal. Lungs are clear. Extensive coronary artery stents are noted. No acute bone abnormality. Aortic atherosclerosis. IMPRESSION: 1. No acute cardiopulmonary disease. 2. Aortic atherosclerosis. Electronically Signed   By: Lorriane Shire M.D.   On: 01/09/2019 08:53    Procedures .Critical Care Performed by: Lennice Sites, DO Authorized by: Lennice Sites, DO   Critical care provider statement:    Critical care time (minutes):  90   Critical care was necessary to treat or prevent imminent or life-threatening deterioration of the following conditions:  CNS failure or compromise   Critical care was time spent personally by me on the following activities:  Blood draw for specimens, development of treatment plan with patient or surrogate, discussions with consultants, discussions with primary provider,  evaluation of patient's response to treatment, examination of patient, obtaining history from patient or surrogate, ordering and performing treatments and interventions, ordering and review of laboratory studies, ordering and review of radiographic studies, pulse oximetry, re-evaluation of patient's condition and review of old charts   I assumed direction of critical care for this patient from another provider in my specialty: no   Procedure Name: Intubation Date/Time: 01/12/2019 11:34 AM Performed by: Lennice Sites, DO Pre-anesthesia Checklist: Patient identified, Patient being monitored, Emergency Drugs available,  Timeout performed and Suction available Oxygen Delivery Method: Non-rebreather mask Preoxygenation: Pre-oxygenation with 100% oxygen Induction Type: Rapid sequence Ventilation: Mask ventilation without difficulty Laryngoscope Size: Glidescope and 3 Grade View: Grade I Tube size: 7.5 mm Number of attempts: 1 Airway Equipment and Method: Video-laryngoscopy Placement Confirmation: ETT inserted through vocal cords under direct vision,  CO2 detector and Breath sounds checked- equal and bilateral Secured at: 21 cm Tube secured with: ETT holder Dental Injury: Teeth and Oropharynx as per pre-operative assessment  Difficulty Due To: Difficulty was unanticipated Future Recommendations: Recommend- induction with short-acting agent, and alternative techniques readily available      (including critical care time)  Medications Ordered in ED Medications  propofol (DIPRIVAN) 1000 MG/100ML infusion (has no administration in time range)  propofol (DIPRIVAN) 1000 MG/100ML infusion (has no administration in time range)  sodium chloride 0.9 % bolus 1,000 mL (0 mLs Intravenous Stopped 12/31/2018 1105)  coag fact Xa recombinant (ANDEXXA) low dose infusion 900 mg (900 mg Intravenous New Bag/Given 01/14/2019 1053)  LORazepam (ATIVAN) injection 1 mg (1 mg Intravenous Given 01/13/2019 1023)  labetalol  (NORMODYNE) 5 MG/ML injection (10 mg  Given 01/11/2019 1105)  midazolam (VERSED) 2 MG/2ML injection (5 mg  Given 01/04/2019 1108)  etomidate (AMIDATE) injection (30 mg Intravenous Given 01/12/2019 1100)  succinylcholine (ANECTINE) injection (100 mg Intravenous Given 12/27/2018 1100)     Initial Impression / Assessment and Plan / ED Course  I have reviewed the triage vital signs and the nursing notes.  Pertinent labs & imaging results that were available during my care of the patient were reviewed by me and considered in my medical decision making (see chart for details).     Reve SHAMARRA WARDA is an 85 year old female history of hypertension, diabetes, anxiety who presents the ED with altered mental status.  Patient with unremarkable vitals.  No fever.  Patient found lying outside on the grass by her neighbor.  Sounds like patient had been there since about 3 AM per EMS.  Patient mostly states that she has a headache but denies any chest pain, shortness of breath.  She appears somnolent but able to participate in exam.  She appears neurologically intact.  Pupils are equal and reactive.  Denies any new medications.  Apparently she lives by herself and this is not her baseline.  Patient does take blood thinner.  Will evaluate with lab work including CT scan of head and neck.  Will evaluate for infectious sources such as UTI as cause of altered mental status.  We will also check images to look for intracranial process.  Radiology called me on the phone to discuss CT scan findings.  Patient with moderate to large volume acute intraventricular hemorrhage.  No obvious mass-effect at this time.  Unclear etiology of the bleed.  Not consistent with a traumatic injury.  Possibly right thalamic.  However it appears that this is mostly all intraventricular.  Patient is on Eliquis.  Consulted pharmacy and will reverse Eliquis with adnexa.  Lab work overall unremarkable.  Coronavirus test negative.    Talked with Dr. Sherwood Gambler  and Dr. Cheral Marker with neurosurgery and neurology respectively and neurosurgery states that no need for emergent ventricular drain at this time.  They prefer that patient be admitted to ICU for further care at [facility]  Dr. Cheral Marker will admit the patient to the neurological ICU at [facility] and neurosurgery will see the patient upon arrival.  Patient continued to have agitation, labored breathing, slow worsening of her mental  status and at this time decision was made to protect her airway and patient was intubated with etomidate and succinylcholine with a grade 1 view.  Chest x-ray showed adequate ET tube placement.  Patient was given Versed, labetalol, propofol for sedation, blood pressure control.  Patient with blood pressure of 160/100 after these interventions.  Will likely need to be started on a drip but transport team was there and we prefer immediate transfer to [facility] for NSU consult.  Believe that propofol will continue to decrease his blood pressure.  Adnexa has been hung as well.  Patient stable to the best of my ability at the time of transport.  This chart was dictated using voice recognition software.  Despite best efforts to proofread,  errors can occur which can change the documentation meaning.    This chart was dictated using voice recognition software.  Despite best efforts to proofread,  errors can occur which can change the documentation meaning.    Final Clinical Impressions(s) / ED Diagnoses   Final diagnoses:  IVH (intraventricular hemorrhage) ([place])  Acute respiratory failure, unspecified whether with hypoxia or hypercapnia [facility])    ED Discharge Orders    None       Lennice Sites, DO 01/07/2019 1141

## 2019-01-17 NOTE — Procedures (Signed)
Extubation Procedure Note  Patient Details:   Name: Diane Fry DOB: 06-Oct-1928 MRN: [id number]   Airway Documentation:   Pt extubated per withdrawal of life support orders. Vent end date: 01/11/2019 Vent end time: 1624   Evaluation  O2 sats: stable throughout Complications: No apparent complications Patient did tolerate procedure well. Bilateral Breath Sounds: Clear   No  Ander Purpura 01/15/2019, 4:24 PM

## 2019-01-17 NOTE — ED Notes (Signed)
Pure wick has been placed. Suction set to 45mmHg.  

## 2019-01-17 NOTE — Progress Notes (Signed)
SLP Cancellation Note  Patient Details Name: Diane Fry MRN: [id number] DOB: 31-Aug-1928   Cancelled treatment:       Reason Eval/Treat Not Completed: Patient not medically ready;Medical issues which prohibited therapy(Pt is currently intubated. SLP will follow up. )  Christee Mervine I. Hardin Negus, [place], [place] Office number [phone] Pager [place] 01/08/2019, 1:46 PM

## 2019-01-17 NOTE — Progress Notes (Signed)
RN aware patient MRSA PCR came back positive. Patient transitioning to comfort. No new orders at this time. Lianne Bushy RN BSN

## 2019-01-17 NOTE — ED Triage Notes (Signed)
Transported by [facility] from home-- found by neighbor in her yard. Lives at home alone. Unknown how long patient was outside in the yard. C/o headache. Usually AO x 4. CBG 169 mg/dl.

## 2019-01-17 NOTE — ED Notes (Signed)
[place] MD providing update to daughter.

## 2019-01-17 NOTE — ED Notes (Signed)
[facility] at bedside 

## 2019-01-17 NOTE — Progress Notes (Signed)
Pharmacy Note - Andexanet alfa (Andexxa) For Reversal of Oral Anticoagulant   Patient's an 85 y.o F with hx Afib on Eliquis 5 mg bid PTA.  She was found down this morning by her neighbor. Uncertain as to when her last Eliquis dose was taken, but Dr. Ronnald Nian suspects dose was taken about 12 hrs ago. Head CT showed large ICH with some midline shift. Per Dr. Ronnald Nian, to proceed with Andexxa for reversal.  This patient has been administered Coagulation Factor Xa (Recombinat), andexanet alfa (ANDEXXA)  The patient was ordered and given a dose of Andexxa 900 mg (400 mg to be given as a bolus over 15 min, then infuse 500 mg over 2 hrs). [facility] RN charted dose given at 1053 on 12/24/2018.     Pharmacy: [place] This patient met criteria for:  [ X ] Peripheral administration ICD-10-PCS Code: TP12258   _0  Dose ordered / given was 900 mg (9x100 mg vials)  _1  Dose ordered / given was 1800 mg (18 x 100 mg vials)   Dia Sitter, PharmD, BCPS 01/04/2019 12:43 PM

## 2019-01-17 NOTE — Consult Note (Signed)
NAME:  Diane Fry, MRN:  [id number], DOB:  March 02, 1929, LOS: 0 ADMISSION DATE:  12/22/2018, CONSULTATION DATE:  01/12/2019 REFERRING MD:  Cheral Marker, CHIEF COMPLAINT:  Intraventricular hemorrhage   Brief History   85 year old female who lives alone was found down in her yard by a neighbor. Patient transported to [facility] ED and found to have right thalamic intracerebral hematoma with intraventricular hemorrhage and hydrocephalus. Patient transported to [facility] for further work up and [facility].   History of present illness   Independent female with PMH significant for DM, CAD and paroxysmal afib (past anticoagulation on eliquis), brought to [facility] ED by EMS for altered mental status after she was found down in her yard for an unspecified amount of time. Pt hypertensive with BP 161/102. At the time of ED evaluation patient was neurologically intact. CT scan showed moderate to large volume acute intraventricular hemorrhage without obvious mass-effect at this time with unclear etiology. No evidence of traumatic injury. Patient started on Andexxa for eliquis reversal (eliquis status unknown). While awaiting transport to [facility] for Neuro ICU admission patient became agitated with labored breathing and worsening mental status. Patient intubated and transferred to [facility] Neuro ICU.     Past Medical History  HTN, DM, CAD, Paroxysmal afib  Significant Hospital Events   12/26/2018 > Presented to [facility] ED, intubated and transferred to [facility] Neuro ICU 12/28/2018 > family does not wish for aggressive care given poor prognosis. Patient now a DNR, awaiting family arrival for one-way extubation  Consults:  PCCM for Vent Management  Procedures:  01/12/2019 > Intubated  Significant Diagnostic Tests:  12/26/2018 > CT Head > Large volume acute intraventricular hemorrhage with ventriculomegaly and transependymal edema. Unclear if this bleed might have originated in the right thalamus; it might be entirely  intraventricular. In which case consider Coagulopathy. Trace leftward midline shift. Basilar cisterns remain patent  Micro Data:  01/07/2019 > COVID Negative  Antimicrobials:  none  Interim history/subjective:  Patient intubated, posturing with noxious stimuli. Neuro exam improved somewhat after blood pressure brought down by Cleviprex.   Objective   Blood pressure 137/74, pulse (!) 107, temperature (!) 96.1 F (35.6 C), temperature source Axillary, resp. rate 19, height 5\' 3"  (1.6 m), weight 63.5 kg, SpO2 100 %.    Vent Mode: PRVC FiO2 (%):  [100 %] 100 % Set Rate:  [18 bmp] 18 bmp Vt Set:  [420 mL] 420 mL PEEP:  [5 cmH20] 5 cmH20 Plateau Pressure:  [16 cmH20-21 cmH20] 21 cmH20   Intake/Output Summary (Last 24 hours) at 01/18/2019 1245 Last data filed at 01/05/2019 1200    Gross per 24 hour  Intake 10.97 ml  Output -  Net 10.97 ml      Filed Weights   01/14/2019 1106  Weight: 63.5 kg    Examination: General: Somewhat restless in bed with non-purposeful activity.  HENT: pupils equal, 22mm, sluggishly reactive. Small laceration to left upper eye area.  Lungs: Tachypneic, irregular, clear.  Cardiovascular: Mild tachycardia, regular rate and rhythm Abdomen: soft, non-distended, non-tender, active bowel sounds Extremities: warm and dry, extensor posturing with stimulation.  Neuro: Intermittently responds to verbal stimuli. Does not follow commands.  GU: foley  Resolved Hospital Problem list   none  Assessment & Plan:  Right thalamic intracerebral hematoma with secondary intraventricular hemorrhage with obstructive hydrocephalus - Neurosurgery was prepared to place an intraventricular catheter however patient's daughter declined aggressive care.  Plan: DNR Full vent support until daughter's arrival Propofol for sedation  Ventilator dependent secondary to ineffective airway protection Plan: Full ventilator support until family arrives VAP bundle Anticipate  one-way extubation after family arrives  Best practice:  Diet: NPO Pain/Anxiety/Delirium protocol (if indicated): Propofol, fentanyl VAP protocol (if indicated): yes DVT prophylaxis: SCDs GI prophylaxis: protonix Glucose control: n/a Mobility: bedrest Code Status: DNR Family Communication: per neurology Disposition: Critically ill with poor prognosis. Patient's daughter on the way from the [facility] right now. May end up withdrawing care at that point.   Labs   CBC:    Recent Labs  Lab 01/08/2019 0814  WBC 10.0  NEUTROABS 8.4*  HGB 15.1*  HCT 46.3*  MCV 99.8  PLT 704    Basic Metabolic Panel: No results for input(s): NA, K, CL, CO2, GLUCOSE, BUN, CREATININE, CALCIUM, MG, PHOS in the last 168 hours. GFR: CrCl cannot be calculated (Patient's most recent lab result is older than the maximum 21 days allowed.).    Recent Labs  Lab 12/21/2018 0814  WBC 10.0    Liver Function Tests: No results for input(s): AST, ALT, ALKPHOS, BILITOT, PROT, ALBUMIN in the last 168 hours. No results for input(s): LIPASE, AMYLASE in the last 168 hours. No results for input(s): AMMONIA in the last 168 hours.  ABG         Component Value Date/Time   TCO2 33 09/06/2013 1932     Coagulation Profile: No results for input(s): INR, PROTIME in the last 168 hours.  Cardiac Enzymes: No results for input(s): CKTOTAL, CKMB, CKMBINDEX, TROPONINI in the last 168 hours.  HbA1C:        Hgb A1c MFr Bld  Date/Time Value Ref Range Status  09/08/2013 05:29 AM 6.5 (H) <5.7 % Final    Comment:    (NOTE)                                                                       According to the ADA Clinical Practice Recommendations for 2011, when HbA1c is used as a screening test:  >=6.5%   Diagnostic of Diabetes Mellitus           (if abnormal result is confirmed) 5.7-6.4%   Increased risk of developing Diabetes Mellitus References:Diagnosis and Classification of Diabetes  [place],Diabetes Care,2011,34(Suppl 1):S62-S69 and Standards of Medical Care in         Diabetes - 2011,Diabetes UGQB,1694,50 (Suppl 1):S11-S61.  09/07/2013 03:00 AM 6.5 (H) <5.7 % Final    Comment:    (NOTE)                                                                       According to the ADA Clinical Practice Recommendations for 2011, when HbA1c is used as a screening test:  >=6.5%   Diagnostic of Diabetes Mellitus           (if abnormal result is confirmed) 5.7-6.4%   Increased risk of developing Diabetes Mellitus References:Diagnosis and Classification of Diabetes Mellitus,Diabetes TUUE,2800,34(JZPHX 1):S62-S69 and Standards of Medical Care in  Diabetes - 2011,Diabetes Care,2011,34 (Suppl 1):S11-S61.    CBG:    Recent Labs  Lab 01/11/2019 0809  GLUCAP 124*    Review of Systems:   n/a  Past Medical History  She,  has a past medical history of Anxiety, Coronary artery disease, Diabetes mellitus without complication ([place]), Hypertension, and Pneumonia (2015).   Surgical History         Past Surgical History:  Procedure Laterality Date  . CARDIAC CATHETERIZATION N/A 10/31/2014   Procedure: Left Heart Cath and Coronary Angiography;  Surgeon: Charolette Forward, MD;  Location: [place] CV LAB;  Service: Cardiovascular;  Laterality: N/A;  . CATARACT EXTRACTION    . CORONARY ANGIOPLASTY WITH STENT PLACEMENT  10/31/2014   LAD   . 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 FEMUR IM NAIL Left 09/30/2016   Procedure: INTRAMEDULLARY (IM) NAIL FEMORAL;  Surgeon: Nicholes Stairs, MD;  Location: [facility];  Service: Orthopedics;  Laterality: Left;  . PERCUTANEOUS CORONARY STENT INTERVENTION (PCI-S)  10/31/2014   Procedure: Percutaneous  Coronary Stent Intervention (Pci-S);  Surgeon: Charolette Forward, MD;  Location: [place] [facility];  Service: Cardiovascular;;  . TRANSNASAL APPROACH N/A 09/07/2013   Procedure: TRANSNASAL APPROACH;  Surgeon: Izora Gala, MD;  Location: [facility] OR[facility];  Service: ENT;  Laterality: N/A;     Social History   reports that she has never smoked. She has never used smokeless tobacco. She reports that she does not drink alcohol or use drugs.   Family History   Her family history is not on file.   Allergies No Known Allergies   Home Medications         Prior to Admission medications   Medication Sig Start Date End Date Taking? Authorizing Provider  ALPRAZolam (XANAX) 0.25 MG tablet Take 1 tablet (0.25 mg total) by mouth 4 (four) times daily as needed for anxiety. 10/04/16   Elgergawy, Silver Huguenin, MD  amiodarone (PACERONE) 200 MG tablet Take 100 mg by mouth daily.    [provider]  apixaban (ELIQUIS) 5 MG TABS tablet Take 1 tablet (5 mg total) by mouth 2 (two) times daily. 10/04/16   Elgergawy, Silver Huguenin, MD  atorvastatin (LIPITOR) 80 MG tablet Take 40 mg by mouth daily.    [provider]  docusate sodium (COLACE) 100 MG capsule Take 1 capsule (100 mg total) by mouth 2 (two) times daily. 10/04/16   Elgergawy, Silver Huguenin, MD  feeding supplement, ENSURE ENLIVE, (ENSURE ENLIVE) LIQD Take 237 mLs by mouth 2 (two) times daily between meals. 10/04/16   Elgergawy, Silver Huguenin, MD  ferrous sulfate 325 (65 FE) MG tablet Take 1 tablet (325 mg total) by mouth daily with breakfast. 10/04/16   Elgergawy, Silver Huguenin, MD  furosemide (LASIX) 40 MG tablet Take 2 tablets (80 mg total) by mouth daily. Pt takes two tablets in the morning and one at bedtime. 10/04/16   Elgergawy, Silver Huguenin, MD  HYDROcodone-acetaminophen (NORCO) 5-325 MG tablet Take 1-2 tablets by mouth every 6 (six) hours as needed for moderate pain. 10/04/16   Elgergawy, Silver Huguenin, MD  isosorbide mononitrate (IMDUR) 30 MG 24 hr  tablet Take 1 tablet (30 mg total) by mouth every morning. 10/04/16   Elgergawy, Silver Huguenin, MD  levothyroxine (SYNTHROID, LEVOTHROID) 50 MCG tablet Take 50 mcg by mouth daily  before breakfast.     [provider]  losartan (COZAAR) 50 MG tablet Take 50 mg by mouth daily.    [provider]  metFORMIN (GLUCOPHAGE) 500 MG tablet Take 1 tablet (500 mg total) by mouth daily. 11/02/14   Charolette Forward, MD  methocarbamol (ROBAXIN) 500 MG tablet Take 1 tablet (500 mg total) by mouth every 8 (eight) hours as needed for muscle spasms. 10/04/16   Elgergawy, Silver Huguenin, MD  metoprolol tartrate (LOPRESSOR) 25 MG tablet Take 0.5 tablets (12.5 mg total) by mouth 2 (two) times daily. 11/28/14   Charolette Forward, MD  nitroGLYCERIN (NITROSTAT) 0.4 MG SL tablet Place 1 tablet (0.4 mg total) under the tongue every 5 (five) minutes as needed for chest pain. 11/01/14   Charolette Forward, MD  Omega-3 Fatty Acids (FISH OIL PO) Take 1 capsule by mouth daily.    [provider]  Polyethyl Glycol-Propyl Glycol 0.4-0.3 % SOLN Place 1-2 drops into both eyes every 2 (two) hours as needed (for dry eyes).     [provider]  potassium chloride SA (K-DUR,KLOR-CON) 20 MEQ tablet Take 20 mEq by mouth daily.    [provider]  triamcinolone cream (KENALOG) 0.1 % Apply 1 application topically 3 (three) times daily as needed (for itching/irritation).    [provider]     Critical care time: 87

## 2019-01-17 NOTE — Progress Notes (Signed)
RT NOTES: Multiple attempts at ABG done by multiple RTs. Unable to obtain. VBG ordered at this time.

## 2019-01-17 NOTE — Progress Notes (Signed)
Upon admission restraints not present. Order discontinued to reflect this. Lianne Bushy RN BSN.

## 2019-01-17 NOTE — Plan of Care (Signed)
Met with daughter and discussed next steps. -Daughter does not want SNF or aggressive interventions like ventriculostomy. -We agreed Diane Fry has lived a long life and should be allowed to pass on her own terms.  20 minutes spent discussing comfort care. Extubate with morphine drip and PRNs Daughter to stay for this Given clinical trajectory death expected within 22h

## 2019-01-18 DIAGNOSIS — I161 Hypertensive emergency: Secondary | ICD-10-CM

## 2019-01-18 DIAGNOSIS — Z515 Encounter for palliative care: Secondary | ICD-10-CM

## 2019-01-18 DIAGNOSIS — J9600 Acute respiratory failure, unspecified whether with hypoxia or hypercapnia: Secondary | ICD-10-CM

## 2019-01-18 DIAGNOSIS — G911 Obstructive hydrocephalus: Secondary | ICD-10-CM

## 2019-01-18 DIAGNOSIS — I480 Paroxysmal atrial fibrillation: Secondary | ICD-10-CM

## 2019-01-18 DIAGNOSIS — Z7901 Long term (current) use of anticoagulants: Secondary | ICD-10-CM

## 2019-01-18 NOTE — Progress Notes (Signed)
Chaplain was on unit and stopped in to see patient's daughter, [name], bedside. Patient is lingering, and chaplain offered ministry of presence and support, encouraging daughter to take time for herself.  Chaplain is willing to be bedside later in the night if daughter wants someone with her mother. [name] is going home to care for dog, Berline Lopes, before dark. Chaplain assured her that her mother was well-cared for and that chaplain is nearby. Rev. Tamsen Snider Pager [phone]

## 2019-01-18 NOTE — Progress Notes (Signed)
STROKE TEAM PROGRESS NOTE   INTERVAL HISTORY Diane Fry is at the bedside.  As per Fry, pt was put on eliquis in 2018 after Diane femur fracture. Pt recently had stress and anxiety worrying about Diane bill in the [place] and Fry's HTN, likely caused Diane high BP.   Vitals:   01/14/2019 2253 01/18/19 0000 01/18/19 0700 01/18/19 0800  BP:      Pulse: 75  (!) 107 (!) 101  Resp: 16 15 (!) 23 18  Temp:      TempSrc:      SpO2: (!) 77% (!) 86% (!) 39% (!) 75%  Weight:      Height:        CBC:  Recent Labs  Lab 12/29/2018 0814  WBC 10.0  NEUTROABS 8.4*  HGB 15.1*  HCT 46.3*  MCV 99.8  PLT 184   Urine Drug Screen:     Component Value Date/Time   LABOPIA POSITIVE (A) 12/30/2018 0753   COCAINSCRNUR NONE DETECTED 01/16/2019 0753   LABBENZ POSITIVE (A) 12/22/2018 0753   AMPHETMU NONE DETECTED 01/15/2019 0753   THCU NONE DETECTED 01/03/2019 0753   LABBARB NONE DETECTED 12/30/2018 0753    Alcohol Level     Component Value Date/Time   ETH <10 01/13/2019 0814    IMAGING Ct Head Wo Contrast  Result Date: 12/22/2018 CLINICAL DATA:  85 year old female found down. EXAM: CT HEAD WITHOUT CONTRAST CT CERVICAL SPINE WITHOUT CONTRAST TECHNIQUE: Multidetector CT imaging of the head and cervical spine was performed following the standard protocol without intravenous contrast. Multiplanar CT image reconstructions of the cervical spine were also generated. COMPARISON:  Brain MRI 01/25/2014. FINDINGS: CT HEAD FINDINGS Brain: Moderate to large volume acute intraventricular hemorrhage with associated lateral and 3rd ventriculomegaly. Dilated temporal horns and evidence of transependymal edema. Confluent hemorrhage along the septum pellucidum. It is unclear whether there might be a small component of intra-axial blood in the right deep gray matter nuclei. Small volume of 4th ventricular blood. No superimposed subdural or subarachnoid hemorrhage. Confluent cerebral white matter hypodensity but no acute  cortically based infarct identified. Basilar cisterns remain patent at this time. There is trace leftward midline shift. Vascular: Extensive Calcified atherosclerosis at the skull base. Skull: Intact. Sinuses/Orbits: Chronic right sphenoid sinus mucosal thickening. Other: Left superior convexity scalp hematoma measuring about 5 millimeters in thickness. Underlying calvarium intact. Other visible scalp and face soft tissues appear negative. CT CERVICAL SPINE FINDINGS Alignment: Straightening of cervical lordosis. Mild degenerative appearing anterolisthesis of C7 on T1. Bilateral posterior element alignment is within normal limits. Skull base and vertebrae: Osteopenia. Visualized skull base is intact. No atlanto-occipital dissociation. No acute osseous abnormality identified. Soft tissues and spinal canal: No prevertebral fluid or swelling. No visible canal hematoma. Partially retropharyngeal course of the carotids, with calcified atherosclerosis. Disc levels: Posterior element ankylosis C3 through C5, probably with developing C2-C3 ankylosis. Mild if any degenerative cervical spinal stenosis. Upper chest: Mild-to-moderate T4 compression fracture is new since the 2015 chest CT, but may be chronic. No significant retropulsion. Other visible upper thoracic levels appear intact. Negative lung apices, mild respiratory motion. Negative noncontrast thoracic inlet. IMPRESSION: 1. Large volume acute intraventricular hemorrhage with ventriculomegaly and transependymal edema. Unclear if this bleed might have originated in the right thalamus; it might be entirely intraventricular. In which case consider coagulopathy. 2. Trace leftward midline shift.  Basilar cisterns remain patent. 3.  No acute traumatic injury identified in the cervical spine. 4. Age indeterminate T4 compression fracture, new since 2015,  no complicating features. Critical Value/emergent results were called by telephone at the time of interpretation on 01/18/2019  at 9:29 am to Dr. Lennice Sites , who verbally acknowledged these results. Electronically Signed   By: Genevie Ann M.D.   On: 12/23/2018 09:32   Ct Cervical Spine Wo Contrast  Result Date: 12/20/2018 CLINICAL DATA:  85 year old female found down. EXAM: CT HEAD WITHOUT CONTRAST CT CERVICAL SPINE WITHOUT CONTRAST TECHNIQUE: Multidetector CT imaging of the head and cervical spine was performed following the standard protocol without intravenous contrast. Multiplanar CT image reconstructions of the cervical spine were also generated. COMPARISON:  Brain MRI 01/25/2014. FINDINGS: CT HEAD FINDINGS Brain: Moderate to large volume acute intraventricular hemorrhage with associated lateral and 3rd ventriculomegaly. Dilated temporal horns and evidence of transependymal edema. Confluent hemorrhage along the septum pellucidum. It is unclear whether there might be a small component of intra-axial blood in the right deep gray matter nuclei. Small volume of 4th ventricular blood. No superimposed subdural or subarachnoid hemorrhage. Confluent cerebral white matter hypodensity but no acute cortically based infarct identified. Basilar cisterns remain patent at this time. There is trace leftward midline shift. Vascular: Extensive Calcified atherosclerosis at the skull base. Skull: Intact. Sinuses/Orbits: Chronic right sphenoid sinus mucosal thickening. Other: Left superior convexity scalp hematoma measuring about 5 millimeters in thickness. Underlying calvarium intact. Other visible scalp and face soft tissues appear negative. CT CERVICAL SPINE FINDINGS Alignment: Straightening of cervical lordosis. Mild degenerative appearing anterolisthesis of C7 on T1. Bilateral posterior element alignment is within normal limits. Skull base and vertebrae: Osteopenia. Visualized skull base is intact. No atlanto-occipital dissociation. No acute osseous abnormality identified. Soft tissues and spinal canal: No prevertebral fluid or swelling. No visible  canal hematoma. Partially retropharyngeal course of the carotids, with calcified atherosclerosis. Disc levels: Posterior element ankylosis C3 through C5, probably with developing C2-C3 ankylosis. Mild if any degenerative cervical spinal stenosis. Upper chest: Mild-to-moderate T4 compression fracture is new since the 2015 chest CT, but may be chronic. No significant retropulsion. Other visible upper thoracic levels appear intact. Negative lung apices, mild respiratory motion. Negative noncontrast thoracic inlet. IMPRESSION: 1. Large volume acute intraventricular hemorrhage with ventriculomegaly and transependymal edema. Unclear if this bleed might have originated in the right thalamus; it might be entirely intraventricular. In which case consider coagulopathy. 2. Trace leftward midline shift.  Basilar cisterns remain patent. 3.  No acute traumatic injury identified in the cervical spine. 4. Age indeterminate T4 compression fracture, new since 0623, no complicating features. Critical Value/emergent results were called by telephone at the time of interpretation on 12/21/2018 at 9:29 am to Dr. Lennice Sites , who verbally acknowledged these results. Electronically Signed   By: Genevie Ann M.D.   On: 01/15/2019 09:32   Dg Chest Portable 1 View  Result Date: 12/22/2018 CLINICAL DATA:  Patient status post intubation today. EXAM: PORTABLE CHEST 1 VIEW COMPARISON:  Single-view of the chest earlier today. FINDINGS: New endotracheal tube is in place with the tip 2.3 cm above the carina. Basilar atelectasis noted. Heart size is upper normal. Atherosclerosis noted. IMPRESSION: ET tube tip is 2.3 cm above the carina. Atherosclerosis. Electronically Signed   By: Inge Rise M.D.   On: 01/10/2019 11:32   Dg Chest Portable 1 View  Result Date: 01/12/2019 CLINICAL DATA:  Altered mental status. The patient was found down. EXAM: PORTABLE CHEST 1 VIEW COMPARISON:  09/30/2016 and 08/02/2016 FINDINGS: Heart size and pulmonary  vascularity are normal. Lungs are clear. Extensive coronary artery stents are  noted. No acute bone abnormality. Aortic atherosclerosis. IMPRESSION: 1. No acute cardiopulmonary disease. 2. Aortic atherosclerosis. Electronically Signed   By: Lorriane Shire M.D.   On: 01/18/2019 08:53    PHYSICAL EXAM  Temp:  [96.1 F (35.6 C)-98 F (36.7 C)] 98 F (36.7 C) (07/29 2000) Pulse Rate:  [75-114] 102 (07/30 1000) Resp:  [12-23] 18 (07/30 1000) BP: (93-137)/(52-88) 110/75 (07/29 1600) SpO2:  [39 %-100 %] 51 % (07/30 1000) FiO2 (%):  [100 %] 100 % (07/29 1600)  General - Well nourished, well developed, in no apparent distress.  Ophthalmologic - fundi not visualized due to comfort care measures.  Cardiovascular - Regular rate and rhythm.  Neuro - exam limited due to comfort care measure. Eyes closed, intermittent snorous breathing. Not spontaneous movement in all extremities.    ASSESSMENT/PLAN Diane Fry is a 85 y.o. female with history of pneumonia, hypertension, diabetes, CAD, anxiety who is on Eliquis for AF presenting to [facility] ED after being found down in Diane yard with altered mental status, complains of a HA.  IVH: extensive IVH and hydrocephalus due to HTN in the setting of eliquis use  [facility] Gambler) consulted for EVD. Fry refused once prognosis understood  CT head large IVH with hydro and trace midline shift. transependymal edema.   CT CS no trauma noted. Age indeterminate T4 compression fx  Eliquis (apixaban) daily prior to admission, Eliquis reversed w/ Andexxa in the ED  Patient did not want SNF. Fry opted for comfort care once long-term neurologic prognosis understood that would leave Diane mother disabled and permanently dependent on others if she were to survive  Anticipate hospital death  Acute Respiratory Failure  Secondary to [place]  Intubated in [facility] ED  CCM on board  Terminal wean after discussion w. dtr  On morphine gtt  Afib on eliquis  Hx of  afib following with cardiology  Put on elliquis in 2018  eliquis reversed with andexxa   Hypertensive Emergency  BP 232/173 on arrival  Started on cleviprex drip  SBP goal < 140  Drip stopped with comfort care  Hyperlipidemia  Home meds:  lipitor 80  Diabetes type II   Other Stroke Risk Factors  Advanced age  Coronary artery disease s/p PCI w/ stent  Other Active Problems  MRSA positive  Hospital day # 1  Rosalin Hawking, MD PhD Stroke Neurology 01/18/2019 11:59 AM    To contact Stroke Continuity provider, please refer to [email or web address] After hours, contact General Neurology

## 2019-01-18 NOTE — Progress Notes (Signed)
CCM Note S: Seen in f/u for terminal intracranial bleed and respiratory failure.  No events, patient unresponsive on morphine drip.  O: Today's Vitals   01/12/2019 1600 01/01/2019 2000 12/31/2018 2253 01/18/19 0000  BP: 110/75     Pulse:   75   Resp: 18 13 16 15   Temp:  98 F (36.7 C)    TempSrc:  Axillary    SpO2: 100%  (!) 77% (!) 86%  Weight:      Height:       Body mass index is 24.8 kg/m.  GEN: elderly woman obtunded HEENT: MM dry LUNGS: agonal breathing in cheyne-stokes pattern HEART: RRR, ext warm NEURO/PSYCH: deferred  A: Intracranial bleed- given patient's previously stated wishes against SNF, aggressive interventions not pursued and patient being allowed to pass in peace.  P: Morphine gtt titrated to comfort Death expected within hours  Erskine Emery MD

## 2019-01-18 NOTE — Progress Notes (Signed)
SLP Cancellation Note  Patient Details Name: Diane Fry MRN: [id number] DOB: 09/25/1928   Cancelled treatment:       Reason Eval/Treat Not Completed: (Per MD's note, aggressive interventions will not pursued at this time and pt is unresponsive. SLP will sign off.)  Tanzie Rothschild I. Hardin Negus, [place], [place] Office number [phone] Pager [facility] 01/18/2019, 7:53 AM

## 2019-01-19 LAB — URINE CULTURE: Culture: >=100000 — AB

## 2019-01-19 MED ORDER — MUPIROCIN 2 % EX OINT: TOPICAL_OINTMENT | Freq: Two times a day (BID) | CUTANEOUS | Status: DC

## 2019-01-20 NOTE — Progress Notes (Signed)
   31-Jan-2019 1000  Clinical Encounter Type  Visited With Health care provider;Family  Visit Type Initial;Spiritual support;Death  Referral From Nurse  Spiritual Encounters  Spiritual Needs Emotional;Prayer;Ritual;Grief support  Stress Factors  Family Stress Factors Loss;Major life changes   Met pt's daughter, [name], at her mother's bedside, post-death.  Prayed prayed at time of death and commendation to God from pt's tradition.  Supported Administrator, Civil Service, mom was "all she had left."  Myra Gianotti resident, [phone]

## 2019-01-20 NOTE — Progress Notes (Signed)
CCM Note S: Seen in f/u for terminal intracranial bleed and respiratory failure.  No events, patient unresponsive on morphine drip.  O: Today's Vitals   01/18/19 2100 01/18/19 2200 01/18/19 2300 02/10/2019 0000  BP:      Pulse: 98 100 96 93  Resp: 16 (!) 25 15 (!) 25  Temp:      TempSrc:      SpO2: (!) 70% (!) 64% (!) 54% (!) 62%  Weight:      Height:       Body mass index is 24.8 kg/m.  GEN: elderly woman obtunded HEENT: MM dry LUNGS: breathing 4x/min, rattling breath sounds HEART: RRR, ext lukewarm NEURO/PSYCH: deferred  A: Intracranial bleed- given patient's previously stated wishes against SNF, aggressive interventions not pursued and patient being allowed to pass in peace.  P: -Morphine gtt titrated to comfort -Daughter has been at bedside when able -Fine to transfer to floor if bed needed, in hospital death expected  Erskine Emery MD

## 2019-01-20 NOTE — Death Summary Note (Signed)
DEATH SUMMARY   Patient Details  Name: Diane Fry MRN: [id number] DOB: 10-02-1928  Admission/Discharge Information   Admit Date:  11-Feb-2019  Date of Death: Date of Death: Feb 13, 2019  Time of Death: Time of Death: 0943  Length of Stay: 2  Referring Physician: Lorene Dy, MD   Reason(s) for Hospitalization  IVH  Diagnoses  Preliminary cause of death:  Secondary Diagnoses (including complications and co-morbidities):  Active Problems:   IVH (intraventricular hemorrhage) (HCC)   Obstructive hydrocephalus   Cerebral edema   Persistent A. Fib   Hypertensive emergency   Brief Hospital Course (including significant findings, care, treatment, and services provided and events leading to death)  Diane Fry is a 85 y.o. year old female with history of pneumonia, hypertension, diabetes, CAD, anxiety who is on Eliquis for AF presenting to [facility] ED after being found down in her yard with altered mental status, complains of a HA.  IVH: extensive IVH and hydrocephalus due to HTN in the setting of eliquis use  [facility]) consulted for EVD. Daughter refused once prognosis understood  CT head large IVH with hydro and trace midline shift. transependymal edema.   CT CS no trauma noted. Age indeterminate T4 compression fx  Eliquis (apixaban) daily prior to admission, Eliquis reversed w/ Andexxa in the ED  Patient did not want SNF. Daughter opted for comfort care once long-term neurologic prognosis understood that would leave her mother disabled and permanently dependent on others if she were to survive  Daughter requested comfort care measures  Pt deceased on 02-13-19   Acute Respiratory Failure  Secondary to [place]  Intubated in [facility] ED  CCM on board  Terminal wean after discussion w. dtr  Put on morphine gtt  Afib on eliquis  Hx of afib following with cardiology  Put on elliquis in 07-Oct-2016  eliquis reversed with andexxa   Hypertensive Emergency  BP 232/173 on  arrival  Started on cleviprex drip  SBP goal < 140  Drip stopped with comfort care  Hyperlipidemia  Home meds:  lipitor 80  Diabetes type II   Other Stroke Risk Factors  Advanced age  Coronary artery disease s/p PCI w/ stent  Other Active Problems  MRSA positive    Pertinent Labs and Studies  Significant Diagnostic Studies Ct Head Wo Contrast  Result Date: 02/11/2019 CLINICAL DATA:  85 year old female found down. EXAM: CT HEAD WITHOUT CONTRAST CT CERVICAL SPINE WITHOUT CONTRAST TECHNIQUE: Multidetector CT imaging of the head and cervical spine was performed following the standard protocol without intravenous contrast. Multiplanar CT image reconstructions of the cervical spine were also generated. COMPARISON:  Brain MRI 01/25/2014. FINDINGS: CT HEAD FINDINGS Brain: Moderate to large volume acute intraventricular hemorrhage with associated lateral and 3rd ventriculomegaly. Dilated temporal horns and evidence of transependymal edema. Confluent hemorrhage along the septum pellucidum. It is unclear whether there might be a small component of intra-axial blood in the right deep gray matter nuclei. Small volume of 4th ventricular blood. No superimposed subdural or subarachnoid hemorrhage. Confluent cerebral white matter hypodensity but no acute cortically based infarct identified. Basilar cisterns remain patent at this time. There is trace leftward midline shift. Vascular: Extensive Calcified atherosclerosis at the skull base. Skull: Intact. Sinuses/Orbits: Chronic right sphenoid sinus mucosal thickening. Other: Left superior convexity scalp hematoma measuring about 5 millimeters in thickness. Underlying calvarium intact. Other visible scalp and face soft tissues appear negative. CT CERVICAL SPINE FINDINGS Alignment: Straightening of cervical lordosis. Mild degenerative appearing anterolisthesis of C7 on T1.  Bilateral posterior element alignment is within normal limits. Skull base and  vertebrae: Osteopenia. Visualized skull base is intact. No atlanto-occipital dissociation. No acute osseous abnormality identified. Soft tissues and spinal canal: No prevertebral fluid or swelling. No visible canal hematoma. Partially retropharyngeal course of the carotids, with calcified atherosclerosis. Disc levels: Posterior element ankylosis C3 through C5, probably with developing C2-C3 ankylosis. Mild if any degenerative cervical spinal stenosis. Upper chest: Mild-to-moderate T4 compression fracture is new since the 2015 chest CT, but may be chronic. No significant retropulsion. Other visible upper thoracic levels appear intact. Negative lung apices, mild respiratory motion. Negative noncontrast thoracic inlet. IMPRESSION: 1. Large volume acute intraventricular hemorrhage with ventriculomegaly and transependymal edema. Unclear if this bleed might have originated in the right thalamus; it might be entirely intraventricular. In which case consider coagulopathy. 2. Trace leftward midline shift.  Basilar cisterns remain patent. 3.  No acute traumatic injury identified in the cervical spine. 4. Age indeterminate T4 compression fracture, new since 4782, no complicating features. Critical Value/emergent results were called by telephone at the time of interpretation on 12/28/2018 at 9:29 am to Dr. Lennice Sites , who verbally acknowledged these results. Electronically Signed   By: Genevie Ann M.D.   On: 12/22/2018 09:32   Ct Cervical Spine Wo Contrast  Result Date: 01/03/2019 CLINICAL DATA:  85 year old female found down. EXAM: CT HEAD WITHOUT CONTRAST CT CERVICAL SPINE WITHOUT CONTRAST TECHNIQUE: Multidetector CT imaging of the head and cervical spine was performed following the standard protocol without intravenous contrast. Multiplanar CT image reconstructions of the cervical spine were also generated. COMPARISON:  Brain MRI 01/25/2014. FINDINGS: CT HEAD FINDINGS Brain: Moderate to large volume acute intraventricular  hemorrhage with associated lateral and 3rd ventriculomegaly. Dilated temporal horns and evidence of transependymal edema. Confluent hemorrhage along the septum pellucidum. It is unclear whether there might be a small component of intra-axial blood in the right deep gray matter nuclei. Small volume of 4th ventricular blood. No superimposed subdural or subarachnoid hemorrhage. Confluent cerebral white matter hypodensity but no acute cortically based infarct identified. Basilar cisterns remain patent at this time. There is trace leftward midline shift. Vascular: Extensive Calcified atherosclerosis at the skull base. Skull: Intact. Sinuses/Orbits: Chronic right sphenoid sinus mucosal thickening. Other: Left superior convexity scalp hematoma measuring about 5 millimeters in thickness. Underlying calvarium intact. Other visible scalp and face soft tissues appear negative. CT CERVICAL SPINE FINDINGS Alignment: Straightening of cervical lordosis. Mild degenerative appearing anterolisthesis of C7 on T1. Bilateral posterior element alignment is within normal limits. Skull base and vertebrae: Osteopenia. Visualized skull base is intact. No atlanto-occipital dissociation. No acute osseous abnormality identified. Soft tissues and spinal canal: No prevertebral fluid or swelling. No visible canal hematoma. Partially retropharyngeal course of the carotids, with calcified atherosclerosis. Disc levels: Posterior element ankylosis C3 through C5, probably with developing C2-C3 ankylosis. Mild if any degenerative cervical spinal stenosis. Upper chest: Mild-to-moderate T4 compression fracture is new since the 2015 chest CT, but may be chronic. No significant retropulsion. Other visible upper thoracic levels appear intact. Negative lung apices, mild respiratory motion. Negative noncontrast thoracic inlet. IMPRESSION: 1. Large volume acute intraventricular hemorrhage with ventriculomegaly and transependymal edema. Unclear if this bleed  might have originated in the right thalamus; it might be entirely intraventricular. In which case consider coagulopathy. 2. Trace leftward midline shift.  Basilar cisterns remain patent. 3.  No acute traumatic injury identified in the cervical spine. 4. Age indeterminate T4 compression fracture, new since 9562, no complicating features. Critical Value/emergent  results were called by telephone at the time of interpretation on 01/15/2019 at 9:29 am to Dr. Lennice Sites , who verbally acknowledged these results. Electronically Signed   By: Genevie Ann M.D.   On: 01/16/2019 09:32   Dg Chest Portable 1 View  Result Date: 12/24/2018 CLINICAL DATA:  Patient status post intubation today. EXAM: PORTABLE CHEST 1 VIEW COMPARISON:  Single-view of the chest earlier today. FINDINGS: New endotracheal tube is in place with the tip 2.3 cm above the carina. Basilar atelectasis noted. Heart size is upper normal. Atherosclerosis noted. IMPRESSION: ET tube tip is 2.3 cm above the carina. Atherosclerosis. Electronically Signed   By: Inge Rise M.D.   On: 01/08/2019 11:32   Dg Chest Portable 1 View  Result Date: 12/28/2018 CLINICAL DATA:  Altered mental status. The patient was found down. EXAM: PORTABLE CHEST 1 VIEW COMPARISON:  09/30/2016 and 08/02/2016 FINDINGS: Heart size and pulmonary vascularity are normal. Lungs are clear. Extensive coronary artery stents are noted. No acute bone abnormality. Aortic atherosclerosis. IMPRESSION: 1. No acute cardiopulmonary disease. 2. Aortic atherosclerosis. Electronically Signed   By: Lorriane Shire M.D.   On: 12/20/2018 08:53    Microbiology Recent Results (from the past 240 hour(s))  Urine culture     Status: Abnormal   Collection Time: 01/04/2019  7:52 AM   Specimen: Urine, Random  Result Value Ref Range Status   Specimen Description   Final    URINE, RANDOM Performed at [facility] [address] [place],  [address]    Special Requests   Final     NONE Performed at [facility], [place] [address], [place], [place] [address]    Culture >=100,000 COLONIES/mL ESCHERICHIA COLI (A)  Final   Report Status 2019/01/24 FINAL  Final   Organism ID, Bacteria ESCHERICHIA COLI (A)  Final      Susceptibility   Escherichia coli - MIC*    AMPICILLIN >=32 RESISTANT Resistant     CEFAZOLIN >=64 RESISTANT Resistant     CEFTRIAXONE <=1 SENSITIVE Sensitive     CIPROFLOXACIN >=4 RESISTANT Resistant     GENTAMICIN <=1 SENSITIVE Sensitive     IMIPENEM <=0.25 SENSITIVE Sensitive     NITROFURANTOIN <=16 SENSITIVE Sensitive     TRIMETH/SULFA <=20 SENSITIVE Sensitive     AMPICILLIN/SULBACTAM >=32 RESISTANT Resistant     PIP/TAZO >=128 RESISTANT Resistant     Extended ESBL NEGATIVE Sensitive     * >=100,000 COLONIES/mL ESCHERICHIA COLI  SARS Coronavirus 2 (CEPHEID - Performed in [facility] hospital lab), Hosp Order     Status: None   Collection Time: 01/03/2019  9:38 AM   Specimen: Nasopharyngeal Swab  Result Value Ref Range Status   SARS Coronavirus 2 NEGATIVE NEGATIVE Final    Comment: (NOTE) If result is NEGATIVE SARS-CoV-2 target nucleic acids are NOT DETECTED. The SARS-CoV-2 RNA is generally detectable in upper and lower  respiratory specimens during the acute phase of infection. The lowest  concentration of SARS-CoV-2 viral copies this assay can detect is 250  copies / mL. A negative result does not preclude SARS-CoV-2 infection  and should not be used as the sole basis for treatment or other  patient management decisions.  A negative result may occur with  improper specimen collection / handling, submission of specimen other  than nasopharyngeal swab, presence of viral mutation(s) within the  areas targeted by this assay, and inadequate number of viral copies  (<250 copies / mL). A negative result must  be combined with clinical  observations, patient history, and epidemiological information. If result is POSITIVE SARS-CoV-2  target nucleic acids are DETECTED. The SARS-CoV-2 RNA is generally detectable in upper and lower  respiratory specimens dur ing the acute phase of infection.  Positive  results are indicative of active infection with SARS-CoV-2.  Clinical  correlation with patient history and other diagnostic information is  necessary to determine patient infection status.  Positive results do  not rule out bacterial infection or co-infection with other viruses. If result is PRESUMPTIVE POSTIVE SARS-CoV-2 nucleic acids MAY BE PRESENT.   A presumptive positive result was obtained on the submitted specimen  and confirmed on repeat testing.  While 2019 novel coronavirus  (SARS-CoV-2) nucleic acids may be present in the submitted sample  additional confirmatory testing may be necessary for epidemiological  and / or clinical management purposes  to differentiate between  SARS-CoV-2 and other Sarbecovirus currently known to infect humans.  If clinically indicated additional testing with an alternate test  methodology [phone]) is advised. The SARS-CoV-2 RNA is generally  detectable in upper and lower respiratory sp ecimens during the acute  phase of infection. The expected result is Negative. Fact Sheet for Patients:  StrictlyIdeas.no Fact Sheet for Healthcare Providers: BankingDealers.co.za This test is not yet approved or cleared by the [place] FDA and has been authorized for detection and/or diagnosis of SARS-CoV-2 by FDA under an Emergency Use Authorization (EUA).  This EUA will remain in effect (meaning this test can be used) for the duration of the COVID-19 declaration under Section 564(b)(1) of the Act, 21 U.S.C. section 360bbb-3(b)(1), unless the authorization is terminated or revoked sooner. Performed at [facility], [place] [address] [place], [place] [address]   MRSA PCR Screening     Status: Abnormal   Collection Time:  01/06/2019 12:24 PM   Specimen: Nasal Mucosa; Nasopharyngeal  Result Value Ref Range Status   MRSA by PCR POSITIVE (A) NEGATIVE Final    Comment:        The GeneXpert MRSA Assay (FDA approved for NASAL specimens only), is one component of a comprehensive MRSA colonization surveillance program. It is not intended to diagnose MRSA infection nor to guide or monitor treatment for MRSA infections. RESULT CALLED TO, READ BACK BY AND VERIFIED WITH: Governor Rooks RN 14:15 01/18/2019 ([email or web address]) Performed at [facility], [place] [address], [place], [place] [address]     Lab Basic Metabolic Panel: No results for input(s): NA, K, CL, CO2, GLUCOSE, BUN, CREATININE, CALCIUM, MG, PHOS in the last 168 hours. Liver Function Tests: No results for input(s): AST, ALT, ALKPHOS, BILITOT, PROT, ALBUMIN in the last 168 hours. No results for input(s): LIPASE, AMYLASE in the last 168 hours. No results for input(s): AMMONIA in the last 168 hours. CBC: Recent Labs  Lab 12/26/2018 0814  WBC 10.0  NEUTROABS 8.4*  HGB 15.1*  HCT 46.3*  MCV 99.8  PLT 184   Cardiac Enzymes: No results for input(s): CKTOTAL, CKMB, CKMBINDEX, TROPONINI in the last 168 hours. Sepsis Labs: Recent Labs  Lab 12/21/2018 0814  WBC 10.0    Procedures/Operations  none   Rosalin Hawking 02-06-19, 4:34 PM

## 2019-01-20 DEATH — deceased
